# Patient Record
Sex: Male | Born: 1980 | Race: White | Hispanic: No | Marital: Married | Smoking: Current every day smoker
Health system: Southern US, Community
[De-identification: ages and names within clinical notes are randomized; demographics above are authoritative.]

## PROBLEM LIST (undated history)

## (undated) DIAGNOSIS — R569 Unspecified convulsions: Secondary | ICD-10-CM

## (undated) DIAGNOSIS — K5792 Diverticulitis of intestine, part unspecified, without perforation or abscess without bleeding: Secondary | ICD-10-CM

## (undated) DIAGNOSIS — F32A Depression, unspecified: Secondary | ICD-10-CM

## (undated) DIAGNOSIS — F419 Anxiety disorder, unspecified: Secondary | ICD-10-CM

## (undated) DIAGNOSIS — K219 Gastro-esophageal reflux disease without esophagitis: Secondary | ICD-10-CM

## (undated) DIAGNOSIS — Z87442 Personal history of urinary calculi: Secondary | ICD-10-CM

## (undated) DIAGNOSIS — M549 Dorsalgia, unspecified: Secondary | ICD-10-CM

## (undated) DIAGNOSIS — K4090 Unilateral inguinal hernia, without obstruction or gangrene, not specified as recurrent: Secondary | ICD-10-CM

## (undated) DIAGNOSIS — F4310 Post-traumatic stress disorder, unspecified: Secondary | ICD-10-CM

## (undated) DIAGNOSIS — I10 Essential (primary) hypertension: Secondary | ICD-10-CM

## (undated) HISTORY — DX: Depression, unspecified: F32.A

## (undated) HISTORY — DX: Dorsalgia, unspecified: M54.9

## (undated) HISTORY — PX: COLONOSCOPY: SHX174

## (undated) HISTORY — DX: Diverticulitis of intestine, part unspecified, without perforation or abscess without bleeding: K57.92

## (undated) HISTORY — PX: HERNIA REPAIR: SHX51

## (undated) HISTORY — DX: Anxiety disorder, unspecified: F41.9

## (undated) HISTORY — PX: INGUINAL HERNIA REPAIR: SHX194

## (undated) HISTORY — PX: BACK SURGERY: SHX140

## (undated) HISTORY — PX: COLON SURGERY: SHX602

## (undated) HISTORY — PX: OTHER SURGICAL HISTORY: SHX169

## (undated) HISTORY — DX: Post-traumatic stress disorder, unspecified: F43.10

## (undated) HISTORY — PX: SHOULDER SURGERY: SHX246

---

## 2014-07-29 HISTORY — PX: BOWEL RESECTION: SHX1257

## 2015-07-30 HISTORY — PX: CHOLECYSTECTOMY: SHX55

## 2016-08-22 DIAGNOSIS — K5900 Constipation, unspecified: Secondary | ICD-10-CM | POA: Insufficient documentation

## 2016-11-25 DIAGNOSIS — G459 Transient cerebral ischemic attack, unspecified: Secondary | ICD-10-CM | POA: Insufficient documentation

## 2019-07-07 ENCOUNTER — Emergency Department: Payer: Managed Care, Other (non HMO)

## 2019-07-07 ENCOUNTER — Other Ambulatory Visit: Payer: Self-pay

## 2019-07-07 ENCOUNTER — Emergency Department
Admission: EM | Admit: 2019-07-07 | Discharge: 2019-07-07 | Disposition: A | Discharge status: Home / Self Care | Payer: Managed Care, Other (non HMO) | Attending: Emergency Medicine | Admitting: Emergency Medicine

## 2019-07-07 ENCOUNTER — Encounter: Payer: Self-pay | Admitting: Emergency Medicine

## 2019-07-07 DIAGNOSIS — I10 Essential (primary) hypertension: Secondary | ICD-10-CM | POA: Diagnosis not present

## 2019-07-07 DIAGNOSIS — R109 Unspecified abdominal pain: Secondary | ICD-10-CM | POA: Diagnosis not present

## 2019-07-07 HISTORY — DX: Essential (primary) hypertension: I10

## 2019-07-07 LAB — COMPREHENSIVE METABOLIC PANEL
ALT: 22 U/L (ref 0–44)
AST: 21 U/L (ref 15–41)
Albumin: 4.5 g/dL (ref 3.5–5.0)
Alkaline Phosphatase: 70 U/L (ref 38–126)
Anion gap: 11 (ref 5–15)
BUN: 16 mg/dL (ref 6–20)
CO2: 25 mmol/L (ref 22–32)
Calcium: 9.6 mg/dL (ref 8.9–10.3)
Chloride: 103 mmol/L (ref 98–111)
Creatinine, Ser: 0.75 mg/dL (ref 0.61–1.24)
GFR calc Af Amer: >60 mL/min (ref >60)
GFR calc non Af Amer: >60 mL/min (ref >60)
Glucose, Bld: 81 mg/dL (ref 70–99)
Potassium: 4.2 mmol/L (ref 3.5–5.1)
Sodium: 139 mmol/L (ref 135–145)
Total Bilirubin: 0.5 mg/dL (ref 0.3–1.2)
Total Protein: 7.7 g/dL (ref 6.5–8.1)

## 2019-07-07 LAB — URINALYSIS, COMPLETE (UACMP) WITH MICROSCOPIC
Bacteria, UA: NONE SEEN
Bilirubin Urine: NEGATIVE
Glucose, UA: NEGATIVE mg/dL
Hgb urine dipstick: NEGATIVE
Ketones, ur: NEGATIVE mg/dL
Leukocytes,Ua: NEGATIVE
Nitrite: NEGATIVE
Protein, ur: NEGATIVE mg/dL
Specific Gravity, Urine: 1.021 (ref 1.005–1.030)
Squamous Epithelial / HPF: NONE SEEN (ref 0–5)
pH: 5 (ref 5.0–8.0)

## 2019-07-07 LAB — CBC
HCT: 45.2 % (ref 39.0–52.0)
Hemoglobin: 15.8 g/dL (ref 13.0–17.0)
MCH: 30.5 pg (ref 26.0–34.0)
MCHC: 35 g/dL (ref 30.0–36.0)
MCV: 87.3 fL (ref 80.0–100.0)
Platelets: 212 10*3/uL (ref 150–400)
RBC: 5.18 MIL/uL (ref 4.22–5.81)
RDW: 12.6 % (ref 11.5–15.5)
WBC: 8.9 10*3/uL (ref 4.0–10.5)
nRBC: 0 % (ref 0.0–0.2)

## 2019-07-07 LAB — LIPASE, BLOOD: Lipase: 38 U/L (ref 11–51)

## 2019-07-07 IMAGING — CT CT RENAL STONE PROTOCOL
2 of 4 series · 16 of 46 positions shown, 18 images · non-contrast
Comparison: None.

CLINICAL DATA: Left flank pain

EXAM:
CT ABDOMEN AND PELVIS WITHOUT CONTRAST
TECHNIQUE: Multidetector CT imaging of the abdomen and pelvis was performed
following the standard protocol without IV contrast.

[Series 2: stone full standard · axial · 0.77mm/px · z∈[-536,-56]mm · 13 of 106 slices shown, 15 images]
[im 5/106  soft-tissue]
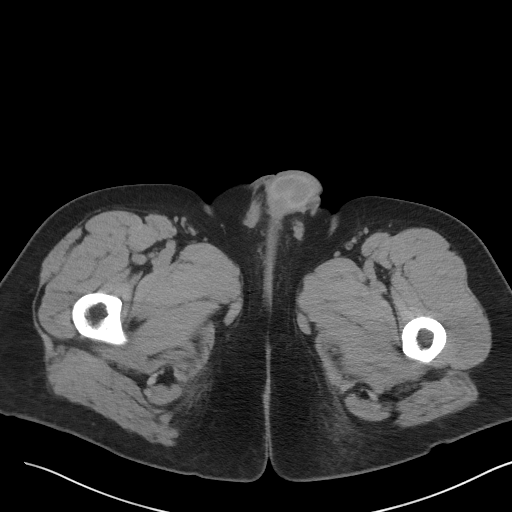
[im 5/106  bone]
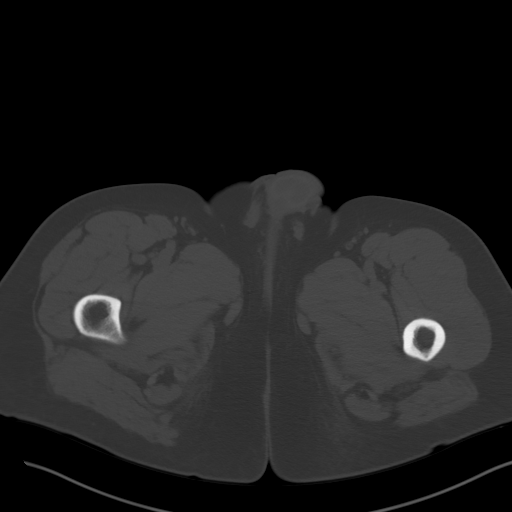
[im 14/106  soft-tissue]
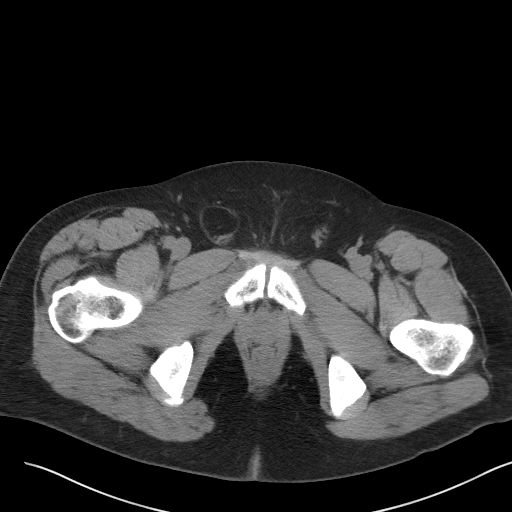
[im 22/106  soft-tissue]
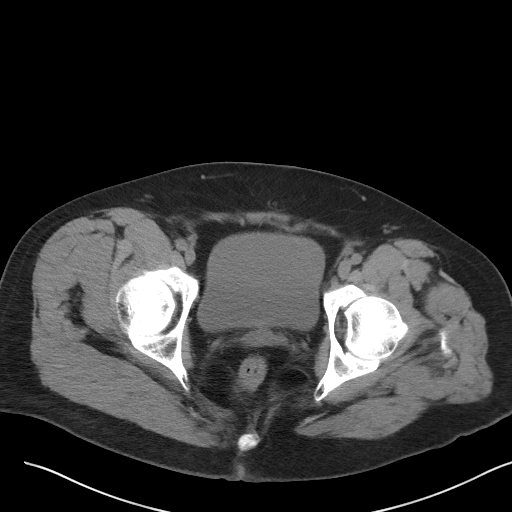
[im 31/106  soft-tissue]
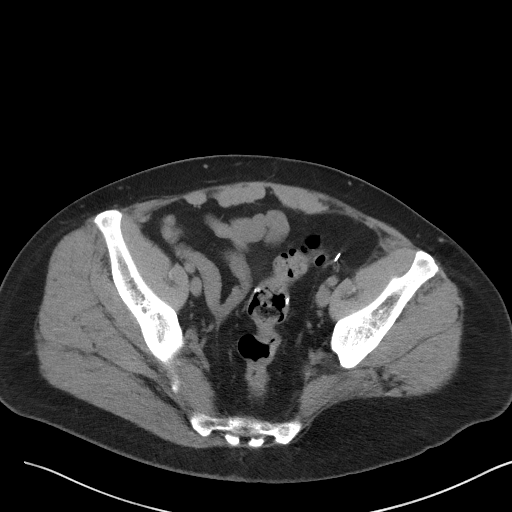
[im 36/106  soft-tissue]
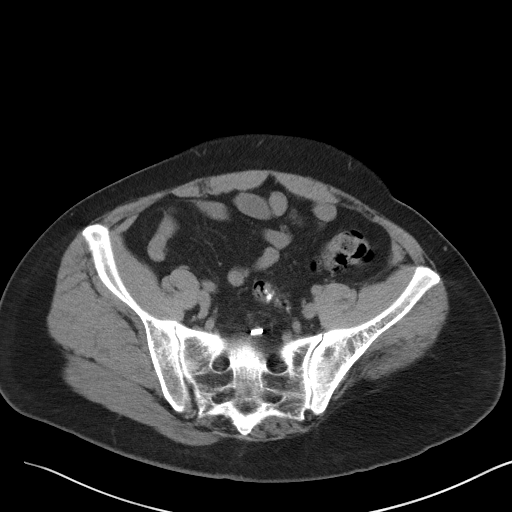
[im 44/106  soft-tissue]
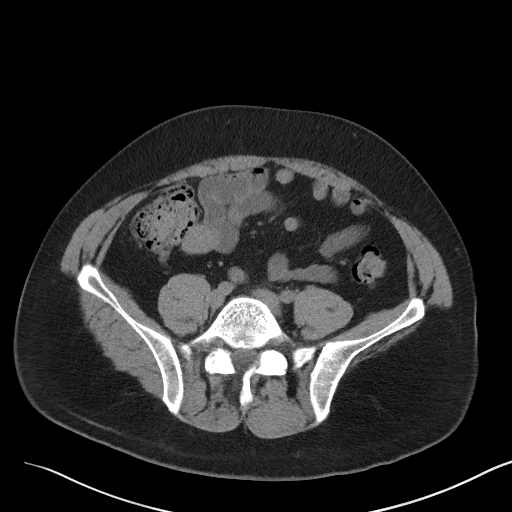
[im 53/106  soft-tissue]
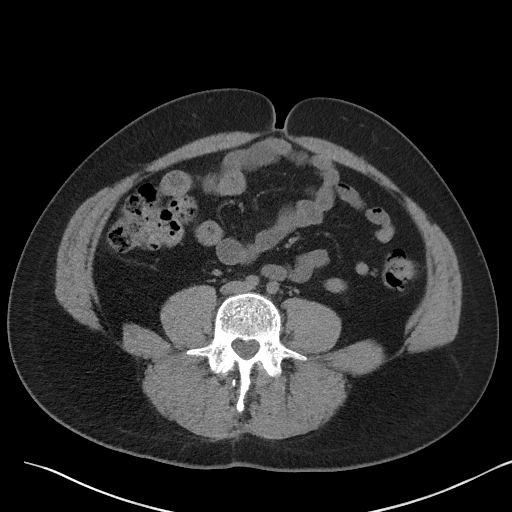
[im 62/106  soft-tissue]
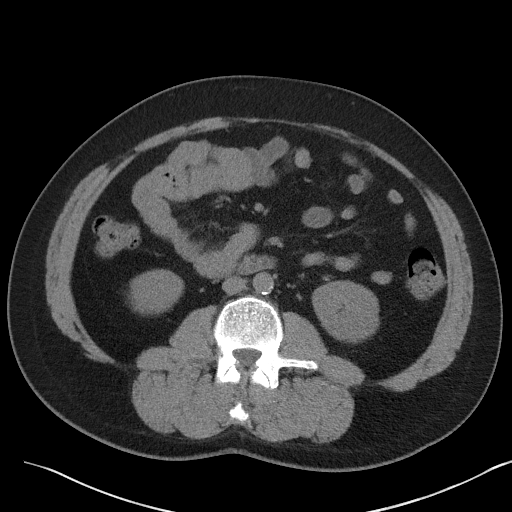
[im 71/106  soft-tissue]
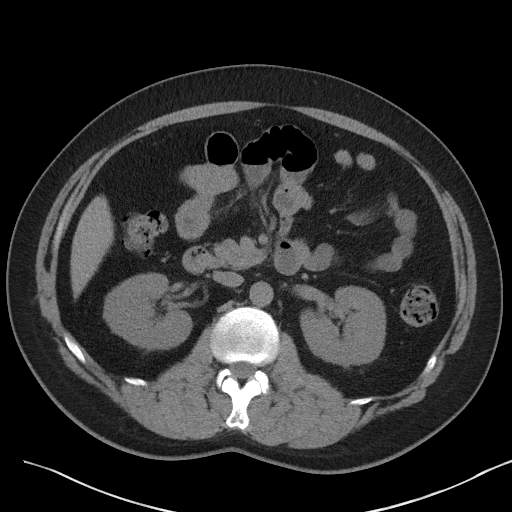
[im 71/106  bone]
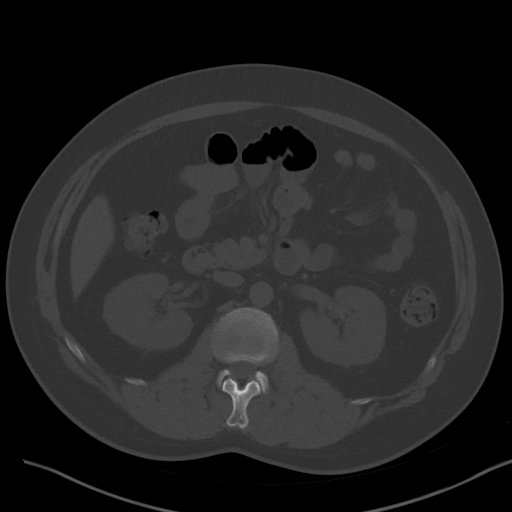
[im 75/106  soft-tissue]
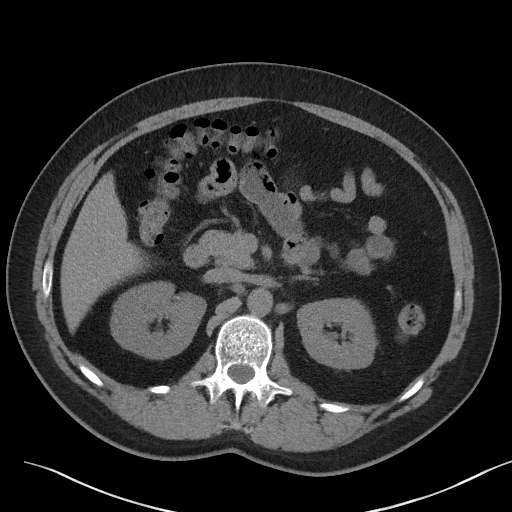
[im 84/106  soft-tissue]
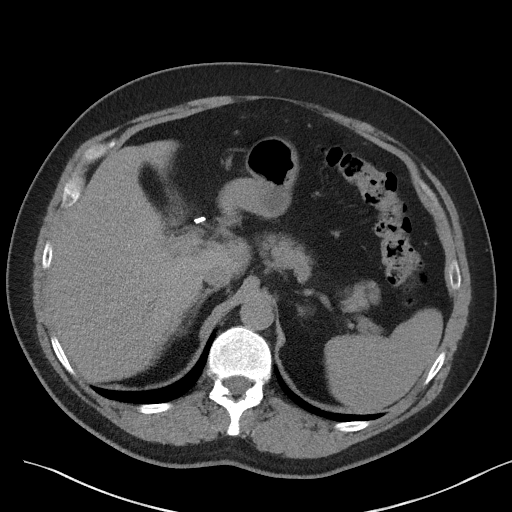
[im 92/106  soft-tissue]
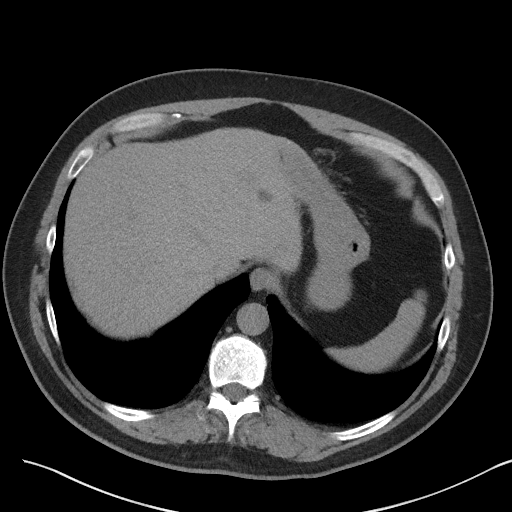
[im 101/106  soft-tissue]
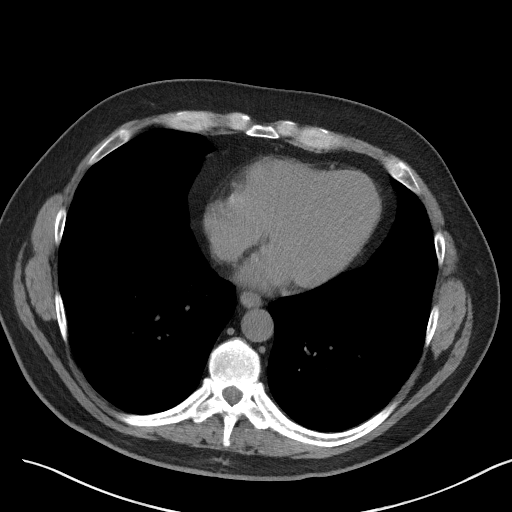

[Series 5: coronal · coronal · 0.89mm/px · 3 of 158 slices shown]
[im 53/158  soft-tissue]
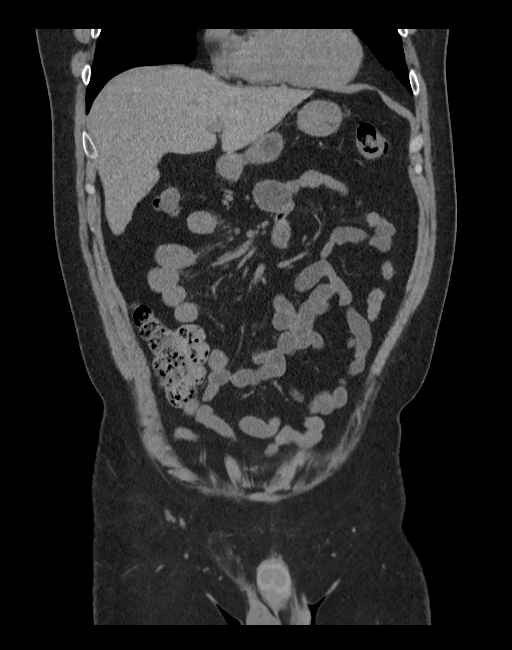
[im 70/158  soft-tissue]
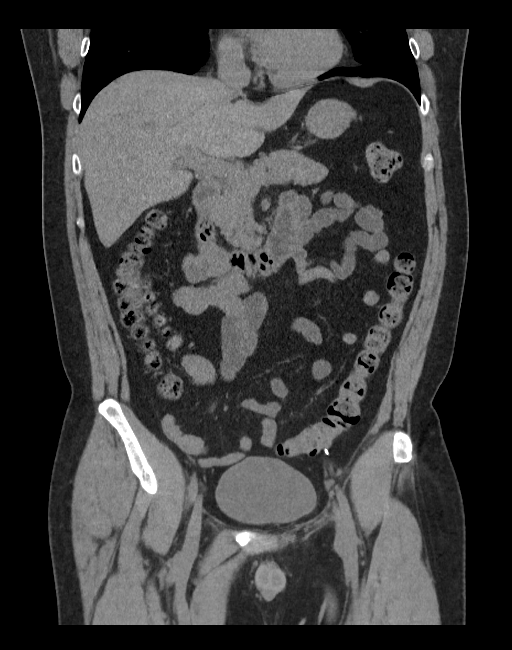
[im 88/158  soft-tissue]
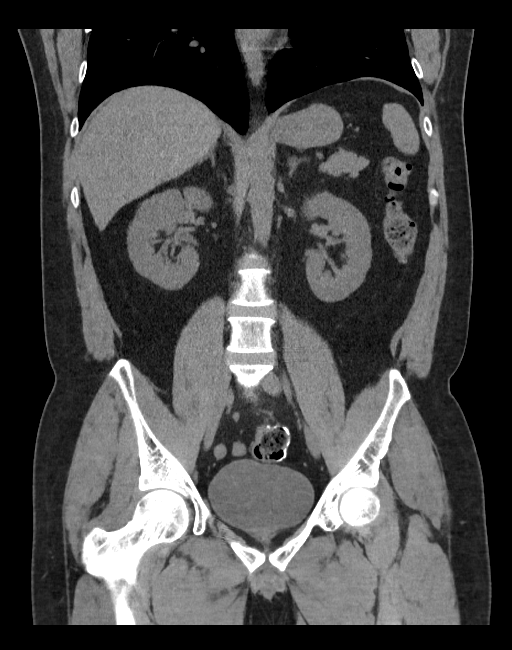

[16 of 46 positions shown; findings below may reference images not displayed]

FINDINGS: Lower chest: Lung bases clear bilaterally.

Hepatobiliary: Several small 1 cm cysts in the left lobe liver. No
liver mass. Postop cholecystectomy without biliary dilatation

Pancreas: Negative

Spleen: Negative

Adrenals/Urinary Tract: Adrenal glands are unremarkable. Kidneys are
normal, without renal calculi, focal lesion, or hydronephrosis.
Bladder is unremarkable.

Stomach/Bowel: Postop sigmoid resection for diverticulitis. Mild
residual diverticular changes in the sigmoid colon. Negative for
bowel obstruction. No bowel mass or edema. Normal appendix.

Vascular/Lymphatic: Negative.

Reproductive: Normal sized prostate.

Other: Inguinal hernia containing fat

Surgical clips in the retroperitoneum on the left presumably from
lymph node removal.

Musculoskeletal: No acute skeletal abnormality. Interspinous spacer
at L4-5.
IMPRESSION: Negative for urinary tract calculi.  No renal obstruction

Sigmoid resection with mild residual diverticular change in the
sigmoid colon. Surgical clips in the retroperitoneum on the left.

Small right inguinal hernia containing fat

## 2019-07-07 MED ORDER — AMOXICILLIN-POT CLAVULANATE 875-125 MG PO TABS: 1.0000 | ORAL_TABLET | Freq: Two times a day (BID) | ORAL | 0 refills | Status: AC

## 2019-07-07 MED ORDER — MORPHINE SULFATE (PF) 4 MG/ML IV SOLN
4.0000 mg | Freq: Once | INTRAVENOUS | Status: DC
  Filled 2019-07-07: qty 1

## 2019-07-07 MED ORDER — ONDANSETRON 4 MG PO TBDP
4.0000 mg | ORAL_TABLET | Freq: Once | ORAL | Status: AC
  Administered 2019-07-07: 4 mg via ORAL
  Filled 2019-07-07: qty 1

## 2019-07-07 MED ORDER — MORPHINE SULFATE (PF) 4 MG/ML IV SOLN
4.0000 mg | Freq: Once | INTRAVENOUS | Status: AC
  Administered 2019-07-07: 4 mg via INTRAVENOUS

## 2019-07-07 MED ORDER — MORPHINE SULFATE (PF) 4 MG/ML IV SOLN
4.0000 mg | Freq: Once | INTRAVENOUS | Status: AC
  Administered 2019-07-07: 4 mg via INTRAVENOUS
  Filled 2019-07-07: qty 1

## 2019-07-07 NOTE — ED Notes (Signed)
Signature pad not working, pt verbalized discharge papers/instructions

## 2019-07-07 NOTE — ED Notes (Signed)
Pt attempting to provided urine sample.

## 2019-07-07 NOTE — ED Provider Notes (Signed)
[facility] Emergency Department Provider Note  Time seen: 5:00 PM  I have reviewed the triage vital signs and the nursing notes.   HISTORY  Chief Complaint Abdominal Pain    HPI Mark Austin is a 38 y.o. male with a past medical history of hypertension presents to the emergency department for left-sided abdominal pain.  According to the patient he has a history of diverticulitis requiring a partial colectomy in the past due to a ruptured diverticula.  Has not had any issues since over the past 5 years however last night he began experiencing dull left-sided abdominal pain.  States he has moderate dull aching pain that has become more constant today.  Denies any vomiting or nausea denies diarrhea black or bloody stool.  Denies dysuria or hematuria.  No fever cough or shortness of breath.   Past Medical History:  Diagnosis Date  . Hypertension     There are no active problems to display for this patient.   Past Surgical History:  Procedure Laterality Date  . back surgeries      Prior to Admission medications   Not on File    Allergies  Allergen Reactions  . Toradol [Ketorolac Tromethamine] Other (See Comments)    seizures  . Tramadol Other (See Comments)    Seizures     No family history on file.  Social History Social History   Tobacco Use  . Smoking status: Not on file  Substance Use Topics  . Alcohol use: Not on file  . Drug use: Not on file    Review of Systems Constitutional: Negative for fever Cardiovascular: Negative for chest pain. Respiratory: Negative for shortness of breath. Gastrointestinal: Positive for abdominal pain.  Negative for nausea vomiting diarrhea Genitourinary: Negative for urinary compaints Musculoskeletal: Negative for musculoskeletal complaints Neurological: Negative for headache All other ROS negative  ____________________________________________   PHYSICAL EXAM:  VITAL SIGNS: ED Triage Vitals   Enc Vitals Group     BP 07/07/19 1548 (!) 183/114     Pulse Rate 07/07/19 1548 89     Resp 07/07/19 1548 19     Temp 07/07/19 1548 98.6 F (37 C)     Temp Source 07/07/19 1548 Oral     SpO2 07/07/19 1548 98 %     Weight 07/07/19 1535 225 lb (102.1 kg)     Height 07/07/19 1535 5\' 11"  (1.803 m)     Head Circumference --      Peak Flow --      Pain Score 07/07/19 1535 8     Pain Loc --      Pain Edu? --      Excl. in [place]? --     Constitutional: Alert and oriented. Well appearing and in no distress. Eyes: Normal exam ENT      Head: Normocephalic and atraumatic.      Mouth/Throat: Mucous membranes are moist. Cardiovascular: Normal rate, regular rhythm. Respiratory: Normal respiratory effort without tachypnea nor retractions. Breath sounds are clear  Gastrointestinal: Soft, moderate left-sided abdominal tenderness palpation without rebound guarding or distention. Musculoskeletal: Nontender with normal range of motion in all extremities.  Neurologic:  Normal speech and language. No gross focal neurologic deficits  Skin:  Skin is warm, dry and intact.  Psychiatric: Mood and affect are normal.  ____________________________________________    EKG  EKG viewed and interpreted by myself shows a normal sinus rhythm 87 bpm with a narrow QRS, mild right axis deviation, largely normal intervals with no concerning  ST changes.  ____________________________________________    RADIOLOGY  CT essentially negative for acute abnormality.  ____________________________________________   INITIAL IMPRESSION / ASSESSMENT AND PLAN / ED COURSE  Pertinent labs & imaging results that were available during my care of the patient were reviewed by me and considered in my medical decision making (see chart for details).   Patient presents to the emergency department for moderate left-sided abdominal pain history of diverticulitis requiring partial colectomy.  Overall the patient appears well moderate  tenderness in left upper quadrant.  We will obtain a CT scan of the abdomen/pelvis to further evaluate.  Labs are largely within normal limits, urinalysis pending.  Lab work is largely reassuring.  CT is essentially negative.  Urinalysis is normal.  However given the patient's history of significant diverticulitis disease with equivocal CT findings we will cover with Augmentin as well as pain medication have the patient follow-up with his doctor.  Patient agreeable to plan of care.  Discussed return precautions.  Mark Austin was evaluated in Emergency Department on 07/07/2019 for the symptoms described in the history of present illness. He was evaluated in the context of the global COVID-19 pandemic, which necessitated consideration that the patient might be at risk for infection with the SARS-CoV-2 virus that causes COVID-19. Institutional protocols and algorithms that pertain to the evaluation of patients at risk for COVID-19 are in a state of rapid change based on information released by regulatory bodies including the CDC and federal and state organizations. These policies and algorithms were followed during the patient's care in the ED.  ____________________________________________   FINAL CLINICAL IMPRESSION(S) / ED DIAGNOSES  Left-sided abdominal pain   Harvest Dark, MD 07/07/19 1812

## 2019-07-07 NOTE — ED Triage Notes (Signed)
Pt reports pain to LUQ for a day and a half, sharp in nature worse with sudden movement and constant.

## 2019-09-27 ENCOUNTER — Emergency Department: Payer: Managed Care, Other (non HMO)

## 2019-09-27 ENCOUNTER — Other Ambulatory Visit: Payer: Self-pay

## 2019-09-27 ENCOUNTER — Encounter: Payer: Self-pay | Admitting: Emergency Medicine

## 2019-09-27 ENCOUNTER — Emergency Department
Admission: EM | Admit: 2019-09-27 | Discharge: 2019-09-27 | Disposition: A | Discharge status: Home / Self Care | Payer: Managed Care, Other (non HMO) | Attending: Emergency Medicine | Admitting: Emergency Medicine

## 2019-09-27 DIAGNOSIS — I10 Essential (primary) hypertension: Secondary | ICD-10-CM | POA: Diagnosis not present

## 2019-09-27 DIAGNOSIS — F17200 Nicotine dependence, unspecified, uncomplicated: Secondary | ICD-10-CM | POA: Diagnosis not present

## 2019-09-27 DIAGNOSIS — K4090 Unilateral inguinal hernia, without obstruction or gangrene, not specified as recurrent: Secondary | ICD-10-CM | POA: Insufficient documentation

## 2019-09-27 DIAGNOSIS — R1031 Right lower quadrant pain: Secondary | ICD-10-CM | POA: Diagnosis present

## 2019-09-27 LAB — URINALYSIS, COMPLETE (UACMP) WITH MICROSCOPIC
Bacteria, UA: NONE SEEN
Bilirubin Urine: NEGATIVE
Glucose, UA: NEGATIVE mg/dL
Hgb urine dipstick: NEGATIVE
Ketones, ur: NEGATIVE mg/dL
Leukocytes,Ua: NEGATIVE
Nitrite: NEGATIVE
Protein, ur: NEGATIVE mg/dL
Specific Gravity, Urine: 1.01 (ref 1.005–1.030)
Squamous Epithelial / HPF: NONE SEEN (ref 0–5)
pH: 6 (ref 5.0–8.0)

## 2019-09-27 LAB — CBC
HCT: 53.5 % — ABNORMAL HIGH (ref 39.0–52.0)
Hemoglobin: 18.1 g/dL — ABNORMAL HIGH (ref 13.0–17.0)
MCH: 30.7 pg (ref 26.0–34.0)
MCHC: 33.8 g/dL (ref 30.0–36.0)
MCV: 90.8 fL (ref 80.0–100.0)
Platelets: 246 10*3/uL (ref 150–400)
RBC: 5.89 MIL/uL — ABNORMAL HIGH (ref 4.22–5.81)
RDW: 12.6 % (ref 11.5–15.5)
WBC: 11.2 10*3/uL — ABNORMAL HIGH (ref 4.0–10.5)
nRBC: 0 % (ref 0.0–0.2)

## 2019-09-27 LAB — COMPREHENSIVE METABOLIC PANEL
ALT: 104 U/L — ABNORMAL HIGH (ref 0–44)
AST: 275 U/L — ABNORMAL HIGH (ref 15–41)
Albumin: 4.4 g/dL (ref 3.5–5.0)
Alkaline Phosphatase: 59 U/L (ref 38–126)
Anion gap: 10 (ref 5–15)
BUN: 11 mg/dL (ref 6–20)
CO2: 25 mmol/L (ref 22–32)
Calcium: 9.4 mg/dL (ref 8.9–10.3)
Chloride: 99 mmol/L (ref 98–111)
Creatinine, Ser: 0.88 mg/dL (ref 0.61–1.24)
GFR calc Af Amer: >60 mL/min (ref >60)
GFR calc non Af Amer: >60 mL/min (ref >60)
Glucose, Bld: 92 mg/dL (ref 70–99)
Potassium: 4.4 mmol/L (ref 3.5–5.1)
Sodium: 134 mmol/L — ABNORMAL LOW (ref 135–145)
Total Bilirubin: 0.8 mg/dL (ref 0.3–1.2)
Total Protein: 7.7 g/dL (ref 6.5–8.1)

## 2019-09-27 LAB — LIPASE, BLOOD: Lipase: 30 U/L (ref 11–51)

## 2019-09-27 IMAGING — CT CT RENAL STONE PROTOCOL
2 of 4 series · 16 of 46 positions shown, 18 images · non-contrast
Comparison: [DATE].

CLINICAL DATA: Acute right flank pain.

EXAM:
CT ABDOMEN AND PELVIS WITHOUT CONTRAST
TECHNIQUE: Multidetector CT imaging of the abdomen and pelvis was performed
following the standard protocol without IV contrast.

[Series 2: stone full standard · axial · 0.74mm/px · z∈[-538,-74]mm · 13 of 103 slices shown, 15 images]
[im 5/103  soft-tissue]
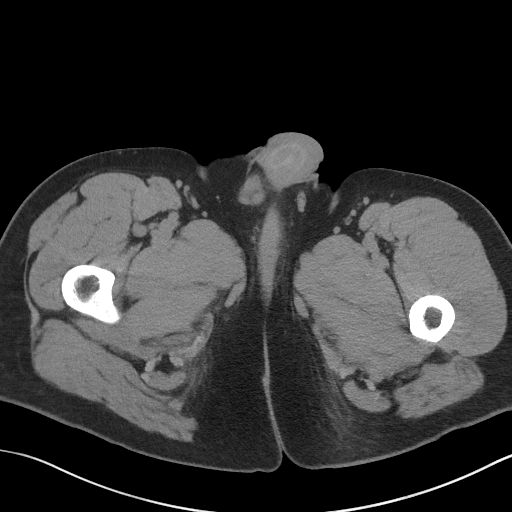
[im 5/103  bone]
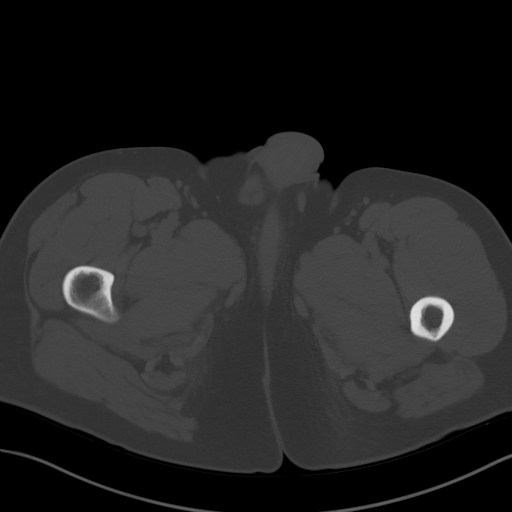
[im 13/103  soft-tissue]
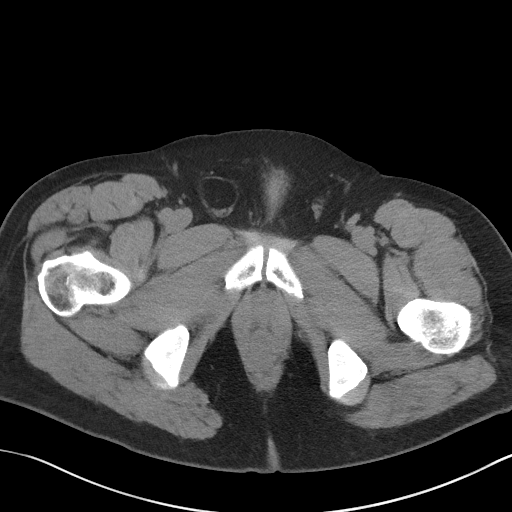
[im 22/103  soft-tissue]
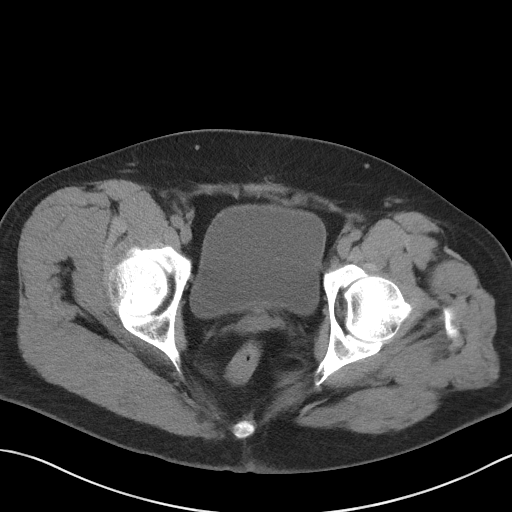
[im 30/103  soft-tissue]
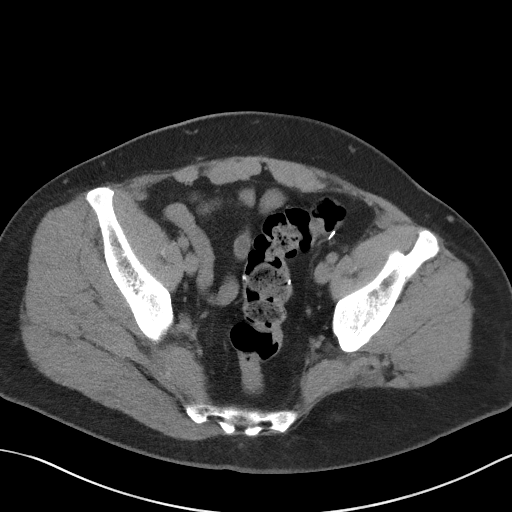
[im 35/103  soft-tissue]
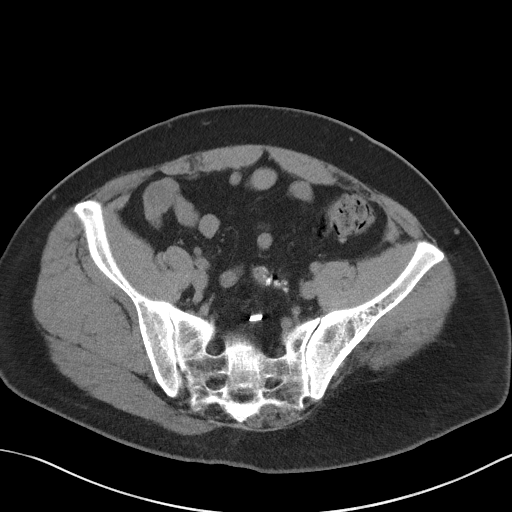
[im 43/103  soft-tissue]
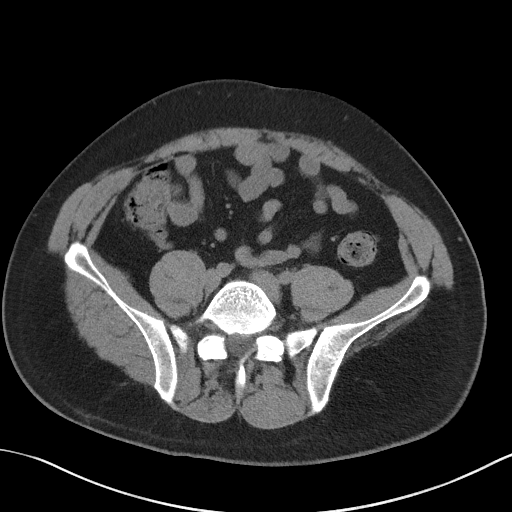
[im 52/103  soft-tissue]
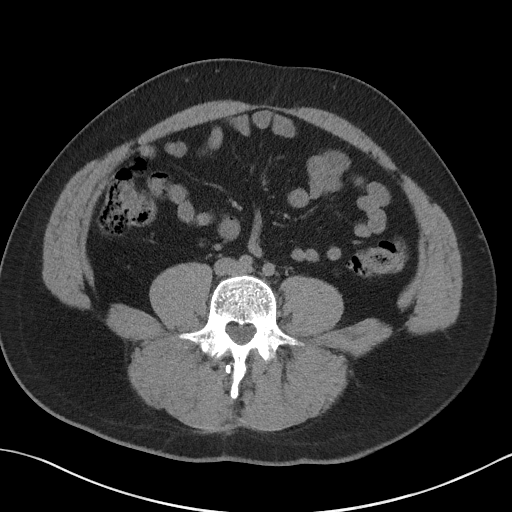
[im 60/103  soft-tissue]
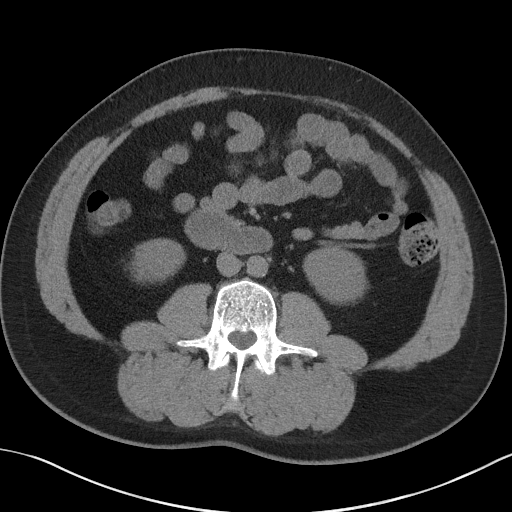
[im 69/103  soft-tissue]
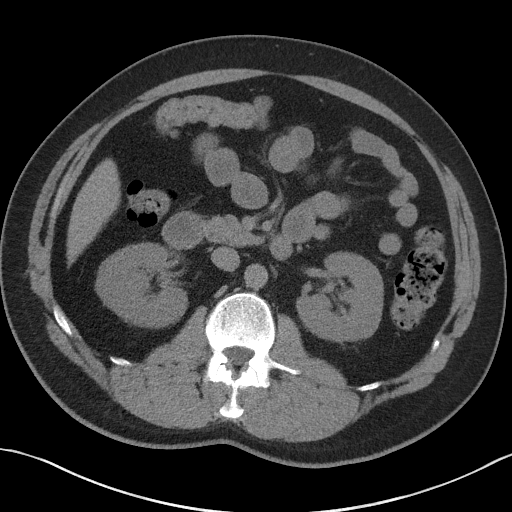
[im 69/103  bone]
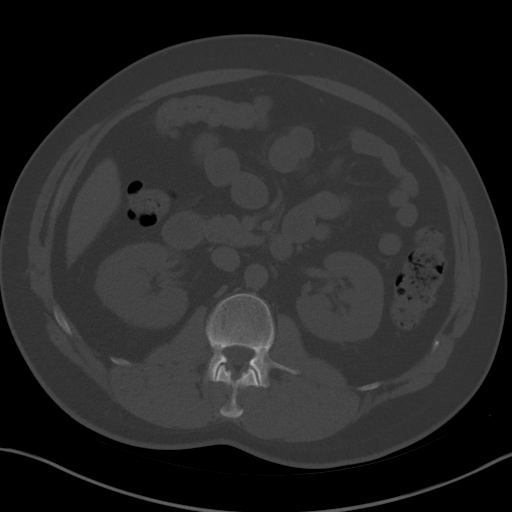
[im 73/103  soft-tissue]
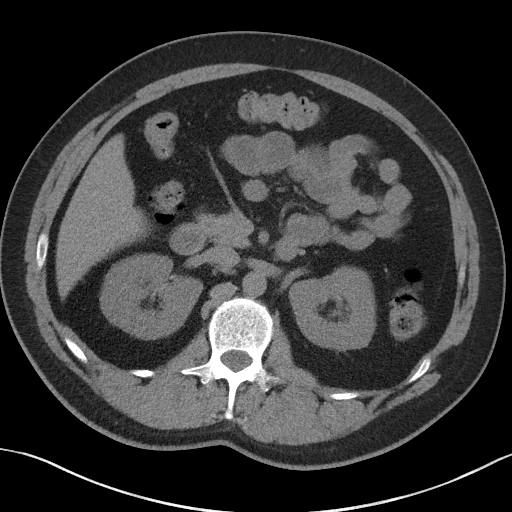
[im 81/103  soft-tissue]
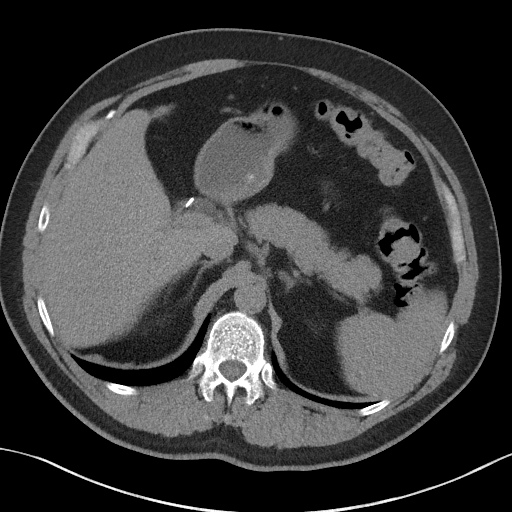
[im 90/103  soft-tissue]
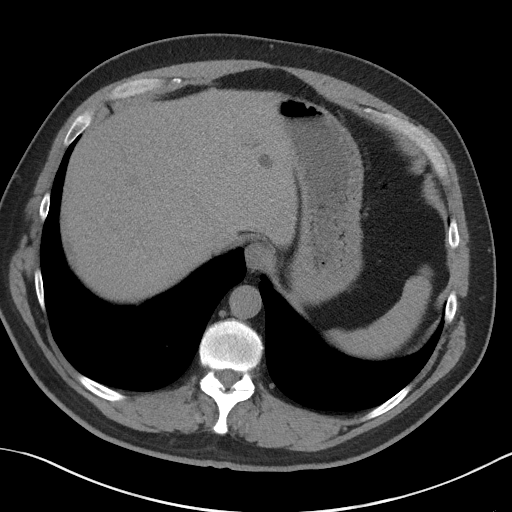
[im 98/103  soft-tissue]
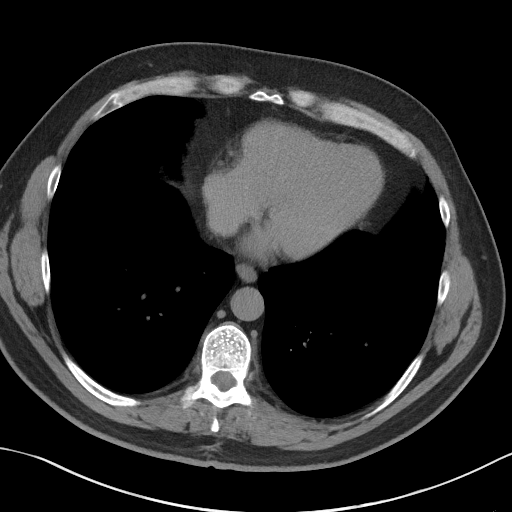

[Series 5: coronal · coronal · 0.85mm/px · 3 of 165 slices shown]
[im 55/165  soft-tissue]
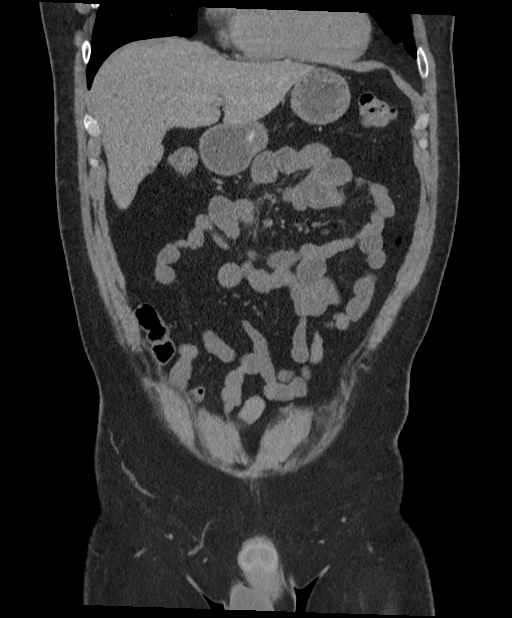
[im 73/165  soft-tissue]
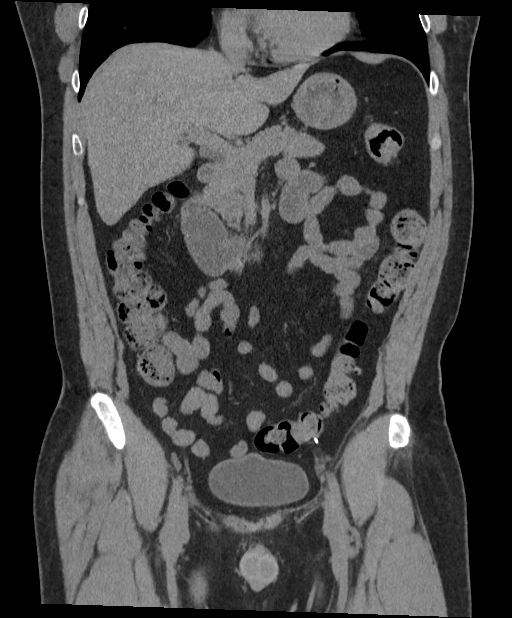
[im 92/165  soft-tissue]
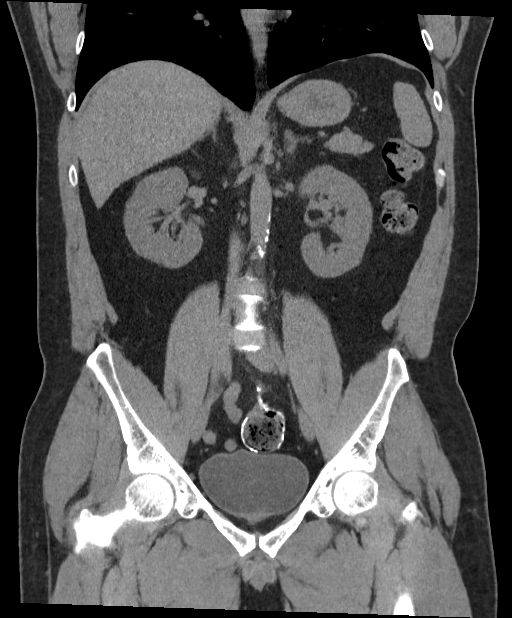

[16 of 46 positions shown; findings below may reference images not displayed]

FINDINGS: Lower chest: No acute abnormality.

Hepatobiliary: Status post cholecystectomy. No biliary dilatation is
noted. Stable hepatic cysts are noted.

Pancreas: Unremarkable. No pancreatic ductal dilatation or
surrounding inflammatory changes.

Spleen: Normal in size without focal abnormality.

Adrenals/Urinary Tract: Adrenal glands are unremarkable. Kidneys are
normal, without renal calculi, focal lesion, or hydronephrosis.
Bladder is unremarkable.

Stomach/Bowel: Stomach is within normal limits. Appendix appears
normal. No evidence of bowel wall thickening, distention, or
inflammatory changes.

Vascular/Lymphatic: Aortic atherosclerosis. No enlarged abdominal or
pelvic lymph nodes.

Reproductive: Prostate is unremarkable.

Other: Small fat containing right inguinal hernia is noted. No
ascites is noted.

Musculoskeletal: No acute or significant osseous findings.
IMPRESSION: 1. Small fat containing right inguinal hernia.
2. Stable hepatic cysts.
3. No hydronephrosis or renal obstruction is noted. No renal or
ureteral calculi are noted.

Aortic Atherosclerosis ([93]-[93]).

## 2019-09-27 MED ORDER — MORPHINE SULFATE (PF) 4 MG/ML IV SOLN
4.0000 mg | Freq: Once | INTRAVENOUS | Status: AC
  Administered 2019-09-27: 14:00:00 4 mg via INTRAVENOUS
  Filled 2019-09-27: qty 1

## 2019-09-27 MED ORDER — ONDANSETRON HCL 4 MG/2ML IJ SOLN
4.0000 mg | Freq: Once | INTRAMUSCULAR | Status: AC
  Administered 2019-09-27: 4 mg via INTRAVENOUS
  Filled 2019-09-27: qty 2

## 2019-09-27 NOTE — ED Triage Notes (Signed)
Patient presents to the ED with right lower quadrant abdominal pain that began while he was working out at the gym approx. 2 hours ago.  Patient states pain has increased until it is now unbearable.

## 2019-09-27 NOTE — ED Provider Notes (Signed)
[facility] Emergency Department Provider Note   ____________________________________________    I have reviewed the triage vital signs and the nursing notes.   HISTORY  Chief Complaint Abdominal Pain     HPI Mark Austin is a 39 y.o. male who presents with complaints of abdominal pain.  Patient describes right groin pain that occurred approximately 2 hours prior to arrival.  He reports it is a sharp poking sensation which comes and goes.  Denies nausea or vomiting.  No fevers or chills.  No injury to the area.  Does have history of kidney stones however this does not feel quite the same, usually his pain is further in his flank.  Does not radiate.  Past Medical History:  Diagnosis Date  . Hypertension     There are no problems to display for this patient.   Past Surgical History:  Procedure Laterality Date  . back surgeries      Prior to Admission medications   Not on File     Allergies Toradol [ketorolac tromethamine] and Tramadol  No family history on file.  Social History Social History   Tobacco Use  . Smoking status: Current Every Day Smoker    Packs/day: 0.75  . Smokeless tobacco: Never Used  Substance Use Topics  . Alcohol use: Never  . Drug use: Not on file    Review of Systems  Constitutional: No fever/chills Eyes: No visual changes.  ENT: No sore throat. Cardiovascular: Denies chest pain. Respiratory: Denies shortness of breath. Gastrointestinal: As above Genitourinary: As above Musculoskeletal: Negative for back pain. Skin: Negative for rash. Neurological: Negative for headaches or weakness   ____________________________________________   PHYSICAL EXAM:  VITAL SIGNS: ED Triage Vitals  Enc Vitals Group     BP 09/27/19 1136 (!) 161/99     Pulse Rate 09/27/19 1136 98     Resp 09/27/19 1136 18     Temp 09/27/19 1136 98.2 F (36.8 C)     Temp Source 09/27/19 1136 Oral     SpO2 09/27/19 1136 97 %   Weight 09/27/19 1137 99.8 kg (220 lb)     Height 09/27/19 1137 1.803 m (5\' 11" )     Head Circumference --      Peak Flow --      Pain Score 09/27/19 1137 8     Pain Loc --      Pain Edu? --      Excl. in [place]? --     Constitutional: Alert and oriented.  Nose: No congestion/rhinnorhea. Mouth/Throat: Mucous membranes are moist.    Cardiovascular: Normal rate, regular rhythm. Kermit Balo peripheral circulation. Respiratory: Normal respiratory effort.  No retractions. . Gastrointestinal: Soft and nontender. No distention.  No CVA tenderness. Genitourinary: Possible right inguinal hernia Musculoskeletal: .  Warm and well perfused Neurologic:  Normal speech and language. No gross focal neurologic deficits are appreciated.  Skin:  Skin is warm, dry and intact. No rash noted. Psychiatric: Mood and affect are normal. Speech and behavior are normal.  ____________________________________________   LABS (all labs ordered are listed, but only abnormal results are displayed)  Labs Reviewed  COMPREHENSIVE METABOLIC PANEL - Abnormal; Notable for the following components:      Result Value   Sodium 134 (*)    AST 275 (*)    ALT 104 (*)    All other components within normal limits  CBC - Abnormal; Notable for the following components:   WBC 11.2 (*)  RBC 5.89 (*)    Hemoglobin 18.1 (*)    HCT 53.5 (*)    All other components within normal limits  URINALYSIS, COMPLETE (UACMP) WITH MICROSCOPIC - Abnormal; Notable for the following components:   Color, Urine YELLOW (*)    APPearance CLEAR (*)    All other components within normal limits  LIPASE, BLOOD   ____________________________________________  EKG  None ____________________________________________  RADIOLOGY  CT negative for kidney stone, right inguinal hernia, fat-containing ____________________________________________   PROCEDURES  Procedure(s) performed: No  Procedures   Critical Care performed: No  ____________________________________________   INITIAL IMPRESSION / ASSESSMENT AND PLAN / ED COURSE  Pertinent labs & imaging results that were available during my care of the patient were reviewed by me and considered in my medical decision making (see chart for details).  Patient presents with right groin pain, differential includes hernia versus kidney stone.  Will treat with IV morphine, IV Zofran given Toradol allergy.    Mild elevation in white blood cell count is nonspecific.  Patient does have elevation in AST and ALT, I communicated these results to him, [facility] negative for kidney stone, positive for small hernia, not palpable on exam.  Patient is feeling much better after treatment.  He notes that he will follow-up with his general surgeon at the [facility], does not want any prescriptions or referrals.   ____________________________________________   FINAL CLINICAL IMPRESSION(S) / ED DIAGNOSES  Final diagnoses:  Unilateral inguinal hernia without obstruction or gangrene, recurrence not specified        Note:  This document was prepared using Dragon voice recognition software and may include unintentional dictation errors.   Lavonia Drafts, MD 09/27/19 1537

## 2019-09-29 ENCOUNTER — Other Ambulatory Visit: Payer: Self-pay

## 2019-09-29 ENCOUNTER — Emergency Department
Admission: EM | Admit: 2019-09-29 | Discharge: 2019-09-29 | Disposition: A | Discharge status: Home / Self Care | Payer: Managed Care, Other (non HMO) | Attending: Emergency Medicine | Admitting: Emergency Medicine

## 2019-09-29 DIAGNOSIS — R1031 Right lower quadrant pain: Secondary | ICD-10-CM | POA: Diagnosis present

## 2019-09-29 DIAGNOSIS — K4090 Unilateral inguinal hernia, without obstruction or gangrene, not specified as recurrent: Secondary | ICD-10-CM | POA: Diagnosis not present

## 2019-09-29 DIAGNOSIS — F17200 Nicotine dependence, unspecified, uncomplicated: Secondary | ICD-10-CM | POA: Diagnosis not present

## 2019-09-29 DIAGNOSIS — I10 Essential (primary) hypertension: Secondary | ICD-10-CM | POA: Insufficient documentation

## 2019-09-29 LAB — COMPREHENSIVE METABOLIC PANEL
ALT: 81 U/L — ABNORMAL HIGH (ref 0–44)
AST: 130 U/L — ABNORMAL HIGH (ref 15–41)
Albumin: 4.6 g/dL (ref 3.5–5.0)
Alkaline Phosphatase: 54 U/L (ref 38–126)
Anion gap: 7 (ref 5–15)
BUN: 15 mg/dL (ref 6–20)
CO2: 27 mmol/L (ref 22–32)
Calcium: 9.9 mg/dL (ref 8.9–10.3)
Chloride: 100 mmol/L (ref 98–111)
Creatinine, Ser: 0.85 mg/dL (ref 0.61–1.24)
GFR calc Af Amer: >60 mL/min (ref >60)
GFR calc non Af Amer: >60 mL/min (ref >60)
Glucose, Bld: 101 mg/dL — ABNORMAL HIGH (ref 70–99)
Potassium: 4.3 mmol/L (ref 3.5–5.1)
Sodium: 134 mmol/L — ABNORMAL LOW (ref 135–145)
Total Bilirubin: 0.7 mg/dL (ref 0.3–1.2)
Total Protein: 8.1 g/dL (ref 6.5–8.1)

## 2019-09-29 LAB — CBC
HCT: 53.8 % — ABNORMAL HIGH (ref 39.0–52.0)
Hemoglobin: 17.9 g/dL — ABNORMAL HIGH (ref 13.0–17.0)
MCH: 30.8 pg (ref 26.0–34.0)
MCHC: 33.3 g/dL (ref 30.0–36.0)
MCV: 92.6 fL (ref 80.0–100.0)
Platelets: 244 10*3/uL (ref 150–400)
RBC: 5.81 MIL/uL (ref 4.22–5.81)
RDW: 12.8 % (ref 11.5–15.5)
WBC: 11.2 10*3/uL — ABNORMAL HIGH (ref 4.0–10.5)
nRBC: 0 % (ref 0.0–0.2)

## 2019-09-29 LAB — URINALYSIS, COMPLETE (UACMP) WITH MICROSCOPIC
Bacteria, UA: NONE SEEN
Bilirubin Urine: NEGATIVE
Glucose, UA: NEGATIVE mg/dL
Hgb urine dipstick: NEGATIVE
Ketones, ur: NEGATIVE mg/dL
Leukocytes,Ua: NEGATIVE
Nitrite: NEGATIVE
Protein, ur: NEGATIVE mg/dL
Specific Gravity, Urine: 1.01 (ref 1.005–1.030)
Squamous Epithelial / HPF: NONE SEEN (ref 0–5)
pH: 6 (ref 5.0–8.0)

## 2019-09-29 LAB — LIPASE, BLOOD: Lipase: 36 U/L (ref 11–51)

## 2019-09-29 MED ORDER — SODIUM CHLORIDE 0.9% FLUSH
3.0000 mL | Freq: Once | INTRAVENOUS | Status: AC
  Administered 2019-09-29: 3 mL via INTRAVENOUS

## 2019-09-29 MED ORDER — HYDROMORPHONE HCL 1 MG/ML IJ SOLN
1.0000 mg | Freq: Once | INTRAMUSCULAR | Status: AC
  Administered 2019-09-29: 20:00:00 1 mg via INTRAMUSCULAR
  Filled 2019-09-29: qty 1

## 2019-09-29 NOTE — ED Provider Notes (Signed)
[facility] Emergency Department Provider Note  Time seen: 8:00 PM  I have reviewed the triage vital signs and the nursing notes.   HISTORY  Chief Complaint Abdominal Pain   HPI Mark Austin is a 39 y.o. male with a past medical history of hypertension, chronic pain, presents to emergency department for right lower quadrant abdominal pain.  According to the patient the past several weeks he has been experiencing intermittent lower abdominal pain in the right lower quadrant.  States it is gotten worse recently.  Patient was seen for the same several days ago found to have a fat-containing right inguinal hernia at the site of his discomfort.  Denies any fever.  No dysuria.  Normal bowel movement today.  No constipation or diarrhea.  Past Medical History:  Diagnosis Date  . Hypertension     There are no problems to display for this patient.   Past Surgical History:  Procedure Laterality Date  . back surgeries      Prior to Admission medications   Not on File    Allergies  Allergen Reactions  . Toradol [Ketorolac Tromethamine] Other (See Comments)    seizures  . Tramadol Other (See Comments)    Seizures     History reviewed. No pertinent family history.  Social History Social History   Tobacco Use  . Smoking status: Current Every Day Smoker    Packs/day: 0.75  . Smokeless tobacco: Never Used  Substance Use Topics  . Alcohol use: Never  . Drug use: Not on file    Review of Systems Constitutional: Negative for fever. Cardiovascular: Negative for chest pain. Respiratory: Negative for shortness of breath. Gastrointestinal: Right lower quadrant abdominal pain.  Negative for vomiting or diarrhea. Genitourinary: Negative for urinary compaints Musculoskeletal: Negative for musculoskeletal complaints Neurological: Negative for headache All other ROS negative  ____________________________________________   PHYSICAL EXAM:  VITAL SIGNS: ED  Triage Vitals  Enc Vitals Group     BP 09/29/19 1744 (!) 193/117     Pulse Rate 09/29/19 1744 97     Resp 09/29/19 1744 18     Temp 09/29/19 1744 98.4 F (36.9 C)     Temp Source 09/29/19 1744 Oral     SpO2 09/29/19 1744 95 %     Weight 09/29/19 1742 218 lb 4.1 oz (99 kg)     Height 09/29/19 1742 5\' 11"  (1.803 m)     Head Circumference --      Peak Flow --      Pain Score 09/29/19 1742 10     Pain Loc --      Pain Edu? --      Excl. in [place]? --    Constitutional: Alert and oriented. Well appearing and in no distress. Eyes: Normal exam ENT      Head: Normocephalic and atraumatic.      Mouth/Throat: Mucous membranes are moist. Cardiovascular: Normal rate, regular rhythm.  Respiratory: Normal respiratory effort without tachypnea nor retractions. Breath sounds are clear  Gastrointestinal: Soft, mild to moderate tenderness to palpation over the right inguinal canal area.  Normal GU exam.  No scrotal mass or tenderness. Musculoskeletal: Nontender with normal range of motion in all extremities.  Neurologic:  Normal speech and language. No gross focal neurologic deficits  Skin:  Skin is warm, dry and intact.  Psychiatric: Mood and affect are normal.   ____________________________________________    EKG  EKG viewed and interpreted by myself shows a normal sinus rhythm at 96 bpm  with a narrow QRS, normal axis, normal intervals, no concerning ST changes.  INITIAL IMPRESSION / ASSESSMENT AND PLAN / ED COURSE  Pertinent labs & imaging results that were available during my care of the patient were reviewed by me and considered in my medical decision making (see chart for details).   Patient presents to the emergency department for continued right lower quadrant abdominal pain, states it improved somewhat yesterday but worsened again today.  The pain is overlying the right inguinal canal.  Normal GU exam, no signs of a significant inguinal hernia.  No constipation with a normal bowel  movement today.  Lab work is largely unchanged.  Patient had a recent CT scan showing fat-containing right inguinal hernia which is likely the cause of the patient's pain today.  States it feels the same.  We will discharge with surgery follow-up.  Patient is on chronic pain medication at home, we will dose a one-time pain medication in the emergency department.  Patient agreeable to plan of care.  Discussed my typical abdominal pain return precautions.  Mark Austin was evaluated in Emergency Department on 09/29/2019 for the symptoms described in the history of present illness. He was evaluated in the context of the global COVID-19 pandemic, which necessitated consideration that the patient might be at risk for infection with the SARS-CoV-2 virus that causes COVID-19. Institutional protocols and algorithms that pertain to the evaluation of patients at risk for COVID-19 are in a state of rapid change based on information released by regulatory bodies including the CDC and federal and state organizations. These policies and algorithms were followed during the patient's care in the ED.  ____________________________________________   FINAL CLINICAL IMPRESSION(S) / ED DIAGNOSES  Inguinal hernia   Harvest Dark, MD 09/29/19 2021

## 2019-09-29 NOTE — ED Triage Notes (Signed)
Pt comes POV with severe right lower abdominal pain from groin to top of hip. Pt was here Monday and dx with hernia. Told to come back if pain got worse. Pain was fine yesterday but got really bad again today.

## 2019-10-04 ENCOUNTER — Encounter: Payer: Self-pay | Admitting: Surgery

## 2019-10-04 ENCOUNTER — Other Ambulatory Visit: Payer: Self-pay

## 2019-10-04 ENCOUNTER — Ambulatory Visit (INDEPENDENT_AMBULATORY_CARE_PROVIDER_SITE_OTHER): Payer: Managed Care, Other (non HMO) | Admitting: Surgery

## 2019-10-04 VITALS — BP 142/84 | HR 86 | Temp 95.0°F | Ht 71.0 in | Wt 245.0 lb

## 2019-10-04 DIAGNOSIS — K4090 Unilateral inguinal hernia, without obstruction or gangrene, not specified as recurrent: Secondary | ICD-10-CM

## 2019-10-04 NOTE — H&P (View-Only) (Signed)
Patient ID: Mark Austin, male   DOB: [birth year], 39 y.o.   MRN: [id number]  HPI Mark Austin is a 39 y.o. male seen in consultation at the request of Dr. Kerman Passey for a symptomatic right inguinal hernia.  He reports that he started having pain for the last 3 months or so.  The pain is intermittent sharp in nature and moderate to severe intensity.  He also reports that he feels like a hot knife is being stabbed into the right groin.  Pain radiates to the right testicle.  No fevers no chills.  Of note he had a history of [place] well as sigmoid colectomy and cholecystectomy.  He does have a history of chronic back pain and he is allergic to Toradol. He did have a CT scan that have personally reviewed showing evidence of a right inguinal hernia.  No evidence of incarceration or strangulation.  He is able to perform more than 6 METS of activity without any shortness of breath or chest pain.  No nausea no vomiting no fevers no chills no weight loss.  Labs personally reviewed including a CBC and a CMP were completely normal.  HPI  Past Medical History:  Diagnosis Date  . Back ache   . Diverticulitis   . Hypertension     Past Surgical History:  Procedure Laterality Date  . back surgeries    . BACK SURGERY    . BOWEL RESECTION    . CHOLECYSTECTOMY  2017  . INGUINAL HERNIA REPAIR Left   . SHOULDER SURGERY      Family History  Problem Relation Age of Onset  . Healthy Mother   . Healthy Father     Social History Social History   Tobacco Use  . Smoking status: Current Every Day Smoker    Packs/day: 0.75  . Smokeless tobacco: Never Used  Substance Use Topics  . Alcohol use: Never  . Drug use: Not Currently    Allergies  Allergen Reactions  . Toradol [Ketorolac Tromethamine] Other (See Comments)    seizures  . Tramadol Other (See Comments)    Seizures     Current Outpatient Medications  Medication Sig Dispense Refill  .  acetaminophen (TYLENOL) 325 MG tablet Take by mouth.    Marland Kitchen amLODipine (NORVASC) 5 MG tablet Take 5 mg by mouth daily.    . cyclobenzaprine (FLEXERIL) 5 MG tablet Take by mouth.    Marland Kitchen lisinopril (ZESTRIL) 40 MG tablet     . prazosin (MINIPRESS) 1 MG capsule Take by mouth.    . propranolol (INDERAL) 80 MG tablet     . tapentadol (NUCYNTA ER) 100 MG 12 hr tablet Take by mouth.     No current facility-administered medications for this visit.     Review of Systems Full ROS  was asked and was negative except for the information on the HPI  Physical Exam Blood pressure (!) 142/84, pulse 86, temperature (!) 95 F (35 C), temperature source Temporal, height 5\' 11"  (1.803 m), weight 245 lb (111.1 kg), SpO2 97 %. CONSTITUTIONAL: NAD EYES: Pupils are equal, round, and reactive to light, Sclera are non-icteric. EARS, NOSE, MOUTH AND THROAT: Wearing a mask. Marland Kitchen Hearing is intact to voice. LYMPH NODES:  Lymph nodes in the neck are normal. RESPIRATORY:  Lungs are clear. There is normal respiratory effort, with equal breath sounds bilaterally, and without pathologic use of accessory muscles. CARDIOVASCULAR: Heart is regular without murmurs, gallops, or rubs. GI:  The abdomen is  soft, nontender, and nondistended. There are no palpable masses. There is no hepatosplenomegaly. There are normal bowel sounds in all quadrants. Reducible but tender IH right side, no peritonitis GU: Penis w/o lesion. No testicular masses. Tender right testicle. MUSCULOSKELETAL: Normal muscle strength and tone. No cyanosis or edema.   SKIN: Turgor is good and there are no pathologic skin lesions or ulcers. NEUROLOGIC: Motor and sensation is grossly normal. Cranial nerves are grossly intact. PSYCH:  Oriented to person, place and time. Affect is normal.  Data Reviewed  I have personally reviewed the patient's imaging, laboratory findings and medical records.    Assessment/Plan Symptomatic right inguinal hernia.  Discussed with  patient in detail about my recommendation for repair.  Also discussed with him that the most important indicator of postoperative pain is preoperative pain.  I do think that he is a good candidate for robotic approach.  Procedure discussed with the patient in detail.  Risk, benefits and possible complications including but not limited to: Bleeding, chronic pain, recurrence mesh issues and bowel injuries were discussed with him in detail.  He understands and wishes to proceed. A copy of this report was sent to the referring provider    Caroleen Hamman, MD FACS General Surgeon 10/04/2019, 2:25 PM

## 2019-10-04 NOTE — Patient Instructions (Addendum)
Our surgery scheduler Marzetta Board will contact you within the next 24-48 hours for surgery. When Stacy contacts you, during that call she will discuss the preparation prior to surgery, dates and times. Please have the BLUE sheet available when she contacts you. If you have any questions or concerns, please give our office a call.   Inguinal Hernia, Adult An inguinal hernia develops when fat or the intestines push through a weak spot in a muscle where your leg meets your lower abdomen (groin). This creates a bulge. This kind of hernia could also be:  In your scrotum, if you are male.  In folds of skin around your vagina, if you are male. There are three types of inguinal hernias:  Hernias that can be pushed back into the abdomen (are reducible). This type rarely causes pain.  Hernias that are not reducible (are incarcerated).  Hernias that are not reducible and lose their blood supply (are strangulated). This type of hernia requires emergency surgery. What are the causes? This condition is caused by having a weak spot in the muscles or tissues in the groin. This weak spot develops over time. The hernia may poke through the weak spot when you suddenly strain your lower abdominal muscles, such as when you:  Lift a heavy object.  Strain to have a bowel movement. Constipation can lead to straining.  Cough. What increases the risk? This condition is more likely to develop in:  Men.  Pregnant women.  People who: ? Are overweight. ? Work in jobs that require long periods of standing or heavy lifting. ? Have had an inguinal hernia before. ? Smoke or have lung disease. These factors can lead to long-lasting (chronic) coughing. What are the signs or symptoms? Symptoms may depend on the size of the hernia. Often, a small inguinal hernia has no symptoms. Symptoms of a larger hernia may include:  A lump in the groin area. This is easier to see when standing. It might not be visible when lying  down.  Pain or burning in the groin. This may get worse when lifting, straining, or coughing.  A dull ache or a feeling of pressure in the groin.  In men, an unusual lump in the scrotum. Symptoms of a strangulated inguinal hernia may include:  A bulge in your groin that is very painful and tender to the touch.  A bulge that turns red or purple.  Fever, nausea, and vomiting.  Inability to have a bowel movement or to pass gas. How is this diagnosed? This condition is diagnosed based on your symptoms, your medical history, and a physical exam. Your health care provider may feel your groin area and ask you to cough. How is this treated? Treatment depends on the size of your hernia and whether you have symptoms. If you do not have symptoms, your health care provider may have you watch your hernia carefully and have you come in for follow-up visits. If your hernia is large or if you have symptoms, you may need surgery to repair the hernia. Follow these instructions at home: Lifestyle  Avoid lifting heavy objects.  Avoid standing for long periods of time.  Do not use any products that contain nicotine or tobacco, such as cigarettes and e-cigarettes. If you need help quitting, ask your health care provider.  Maintain a healthy weight. Preventing constipation  Take actions to prevent constipation. Constipation leads to straining with bowel movements, which can make a hernia worse or cause a hernia repair to break down.  Your health care provider may recommend that you: ? Drink enough fluid to keep your urine pale yellow. ? Eat foods that are high in fiber, such as fresh fruits and vegetables, whole grains, and beans. ? Limit foods that are high in fat and processed sugars, such as fried or sweet foods. ? Take an over-the-counter or prescription medicine for constipation. General instructions  You may try to push the hernia back in place by very gently pressing on it while lying down. Do  not try to force the bulge back in if it will not push in easily.  Watch your hernia for any changes in shape, size, or color. Get help right away if you notice any changes.  Take over-the-counter and prescription medicines only as told by your health care provider.  Keep all follow-up visits as told by your health care provider. This is important. Contact a health care provider if:  You have a fever.  You develop new symptoms.  Your symptoms get worse. Get help right away if:  You have pain in your groin that suddenly gets worse.  You have a bulge in your groin that: ? Suddenly gets bigger and does not get smaller. ? Becomes red or purple or painful to the touch.  You are a man and you have a sudden pain in your scrotum, or the size of your scrotum suddenly changes.  You cannot push the hernia back in place by very gently pressing on it when you are lying down. Do not try to force the bulge back in if it will not push in easily.  You have nausea or vomiting that does not go away.  You have a fast heartbeat.  You cannot have a bowel movement or pass gas. These symptoms may represent a serious problem that is an emergency. Do not wait to see if the symptoms will go away. Get medical help right away. Call your local emergency services (911 in the [place] Summary  An inguinal hernia develops when fat or the intestines push through a weak spot in a muscle where your leg meets your lower abdomen (groin).  This condition is caused by having a weak spot in muscles or tissue in your groin.  Symptoms may depend on the size of the hernia, and they may include pain or swelling in your groin. A small inguinal hernia often has no symptoms.  Treatment may not be needed if you do not have symptoms. If you have symptoms or a large hernia, you may need surgery to repair the hernia.  Avoid lifting heavy objects. Also avoid standing for long amounts of time. This information is not intended to  replace advice given to you by your health care provider. Make sure you discuss any questions you have with your health care provider. Document Revised: 08/16/2017 Document Reviewed: 04/16/2017 Elsevier Patient Education  [place].   Laparoscopic Inguinal Hernia Repair, Adult, Care After This sheet gives you information about how to care for yourself after your procedure. Your health care provider may also give you more specific instructions. If you have problems or questions, contact your health care provider. What can I expect after the procedure? After the procedure, it is common to have:  Pain.  Swelling and bruising around the incision area.  Scrotal swelling, in men.  Some fluid or blood draining from your incisions. Follow these instructions at home: Incision care  Follow instructions from your health care provider about how to take care of your incisions.  Make sure you: ? Wash your hands with soap and water before you change your bandage (dressing). If soap and water are not available, use hand sanitizer. ? Change your dressing as told by your health care provider. ? Leave stitches (sutures), skin glue, or adhesive strips in place. These skin closures may need to stay in place for 2 weeks or longer. If adhesive strip edges start to loosen and curl up, you may trim the loose edges. Do not remove adhesive strips completely unless your health care provider tells you to do that.  Check your incision area every day for signs of infection. Check for: ? More redness, swelling, or pain. ? More fluid or blood. ? Warmth. ? Pus or a bad smell.  Wear loose, soft clothing while your incisions heal. Driving  Do not drive or use heavy machinery while taking prescription pain medicine.  Do not drive for 24 hours if you were given a medicine to help you relax (sedative) during your procedure. Activity  Do not lift anything that is heavier than 10 lb (4.5 kg), or the limit that  you are told, until your health care provider says that it is safe.  Ask your health care provider what activities are safe for you. A lot of activity during the first week after surgery can increase pain and swelling. For 1 week after your procedure: ? Avoid activities that take a lot of effort, such as exercise or sports. ? You may walk and climb stairs as needed for daily activity, but avoid long walks or climbing stairs for exercise. Managing pain and swelling   Put ice on painful or swollen areas: ? Put ice in a plastic bag. ? Place a towel between your skin and the bag. ? Leave the ice on for 20 minutes, 2-3 times a day. General instructions  Do not take baths, swim, or use a hot tub until your health care provider approves. Ask your health care provider if you may take showers. You may only be allowed to take sponge baths.  Take over-the-counter and prescription medicines only as told by your health care provider.  To prevent or treat constipation while you are taking prescription pain medicine, your health care provider may recommend that you: ? Drink enough fluid to keep your urine pale yellow. ? Take over-the-counter or prescription medicines. ? Eat foods that are high in fiber, such as fresh fruits and vegetables, whole grains, and beans. ? Limit foods that are high in fat and processed sugars, such as fried and sweet foods.  Do not use any products that contain nicotine or tobacco, such as cigarettes and e-cigarettes. If you need help quitting, ask your health care provider.  Drink enough fluid to keep your urine pale yellow.  Keep all follow-up visits as told by your health care provider. This is important. Contact a health care provider if:  You have more redness, swelling, or pain around your incisions or your groin area.  You have more swelling in your scrotum.  You have more fluid or blood coming from your incisions.  Your incisions feel warm to the touch.  You  have severe pain and medicines do not help.  You have abdominal pain or swelling.  You cannot eat or drink without vomiting.  You cannot urinate or pass a bowel movement.  You faint.  You feel dizzy.  You have nausea and vomiting.  You have a fever. Get help right away if:  You have pus or a  bad smell coming from your incisions.  You have redness, warmth, or pain in your leg.  You have chest pain.  You have problems breathing. Summary  Pain, swelling, and bruising are common after the procedure.  Check your incision area every day for signs of infection, such as more redness, swelling, or pain.  Put ice on painful or swollen areas for 20 minutes, 2-3 times a day. This information is not intended to replace advice given to you by your health care provider. Make sure you discuss any questions you have with your health care provider. Document Revised: 12/23/2018 Document Reviewed: 10/24/2016 Elsevier Patient Education  [place].

## 2019-10-04 NOTE — Progress Notes (Signed)
Patient ID: Mark Austin, male   DOB: [birth year], 39 y.o.   MRN: [id number]  HPI Mark Austin is a 39 y.o. male seen in consultation at the request of Dr. Kerman Passey for a symptomatic right inguinal hernia.  He reports that he started having pain for the last 3 months or so.  The pain is intermittent sharp in nature and moderate to severe intensity.  He also reports that he feels like a hot knife is being stabbed into the right groin.  Pain radiates to the right testicle.  No fevers no chills.  Of note he had a history of prior [place] ingu[place] the [place] as well as sigmoid colectomy and cholecystectomy.  He does have a history of chronic back pain and he is allergic to Toradol. He did have a CT scan that have personally reviewed showing evidence of a right inguinal hernia.  No evidence of incarceration or strangulation.  He is able to perform more than 6 METS of activity without any shortness of breath or chest pain.  No nausea no vomiting no fevers no chills no weight loss.  Labs personally reviewed including a CBC and a CMP were completely normal.  HPI  Past Medical History:  Diagnosis Date  . Back ache   . Diverticulitis   . Hypertension     Past Surgical History:  Procedure Laterality Date  . back surgeries    . BACK SURGERY    . BOWEL RESECTION    . CHOLECYSTECTOMY  2017  . INGUINAL HERNIA REPAIR Left   . SHOULDER SURGERY      Family History  Problem Relation Age of Onset  . Healthy Mother   . Healthy Father     Social History Social History   Tobacco Use  . Smoking status: Current Every Day Smoker    Packs/day: 0.75  . Smokeless tobacco: Never Used  Substance Use Topics  . Alcohol use: Never  . Drug use: Not Currently    Allergies  Allergen Reactions  . Toradol [Ketorolac Tromethamine] Other (See Comments)    seizures  . Tramadol Other (See Comments)    Seizures     Current Outpatient Medications  Medication Sig Dispense Refill  .  acetaminophen (TYLENOL) 325 MG tablet Take by mouth.    Marland Kitchen amLODipine (NORVASC) 5 MG tablet Take 5 mg by mouth daily.    . cyclobenzaprine (FLEXERIL) 5 MG tablet Take by mouth.    Marland Kitchen lisinopril (ZESTRIL) 40 MG tablet     . prazosin (MINIPRESS) 1 MG capsule Take by mouth.    . propranolol (INDERAL) 80 MG tablet     . tapentadol (NUCYNTA ER) 100 MG 12 hr tablet Take by mouth.     No current facility-administered medications for this visit.     Review of Systems Full ROS  was asked and was negative except for the information on the HPI  Physical Exam Blood pressure (!) 142/84, pulse 86, temperature (!) 95 F (35 C), temperature source Temporal, height 5\' 11"  (1.803 m), weight 245 lb (111.1 kg), SpO2 97 %. CONSTITUTIONAL: NAD EYES: Pupils are equal, round, and reactive to light, Sclera are non-icteric. EARS, NOSE, MOUTH AND THROAT: Wearing a mask. Marland Kitchen Hearing is intact to voice. LYMPH NODES:  Lymph nodes in the neck are normal. RESPIRATORY:  Lungs are clear. There is normal respiratory effort, with equal breath sounds bilaterally, and without pathologic use of accessory muscles. CARDIOVASCULAR: Heart is regular without murmurs, gallops, or rubs. GI:  The abdomen is  soft, nontender, and nondistended. There are no palpable masses. There is no hepatosplenomegaly. There are normal bowel sounds in all quadrants. Reducible but tender IH right side, no peritonitis GU: Penis w/o lesion. No testicular masses. Tender right testicle. MUSCULOSKELETAL: Normal muscle strength and tone. No cyanosis or edema.   SKIN: Turgor is good and there are no pathologic skin lesions or ulcers. NEUROLOGIC: Motor and sensation is grossly normal. Cranial nerves are grossly intact. PSYCH:  Oriented to person, place and time. Affect is normal.  Data Reviewed  I have personally reviewed the patient's imaging, laboratory findings and medical records.    Assessment/Plan Symptomatic right inguinal hernia.  Discussed with  patient in detail about my recommendation for repair.  Also discussed with him that the most important indicator of postoperative pain is preoperative pain.  I do think that he is a good candidate for robotic approach.  Procedure discussed with the patient in detail.  Risk, benefits and possible complications including but not limited to: Bleeding, chronic pain, recurrence mesh issues and bowel injuries were discussed with him in detail.  He understands and wishes to proceed. A copy of this report was sent to the referring provider    Caroleen Hamman, MD FACS General Surgeon 10/04/2019, 2:25 PM

## 2019-10-05 ENCOUNTER — Telehealth: Payer: Self-pay | Admitting: Surgery

## 2019-10-05 NOTE — Telephone Encounter (Signed)
During he call to discuss upcoming surgery & pre-surgical prep info, the pt was curious to know he approximate recovery & down time for this type of px.  He is aware that due to the clinical nature of his question, a msg will be routed to a member of the clinical team to address on the following business day.  Thank you

## 2019-10-05 NOTE — Telephone Encounter (Signed)
Pt has been advised of pre admission date/time, Covid Testing date and Surgery date.  Surgery Date: 10/15/19 Preadmission Testing Date: 10/11/19 (phone 8a-1p) Covid Testing Date: 10/13/19 - patient advised to go to the [facility] ([place])  Patient has been made aware to call [phone], between 1-3:00pm the day before surgery, to find out what time to arrive.

## 2019-10-06 ENCOUNTER — Telehealth: Payer: Self-pay | Admitting: Surgery

## 2019-10-06 NOTE — Telephone Encounter (Signed)
Pt has been advised of pre admission date/time, Covid Testing date and Surgery date.  Surgery Date: 10/08/19 Preadmission Testing Date: 2 hrs prior on day of sx Covid Testing Date: 10/07/19 - patient advised to go to the [facility] ([place])  Patient has been made aware to call [phone], between 1-3:00pm the day before surgery, to find out what time to arrive.

## 2019-10-06 NOTE — Telephone Encounter (Signed)
Patient notified that he can expect to be out of work for 1-2 weeks following surgery depending on how strenuous his job is. He will have 6 weeks of lifting restrictions of no more than 20 pounds. He is aware to have any paperwork or FMLA forms sent over to our office to be filled out.

## 2019-10-07 ENCOUNTER — Other Ambulatory Visit
Admission: RE | Admit: 2019-10-07 | Discharge: 2019-10-07 | Disposition: A | Discharge status: Home / Self Care | Payer: Managed Care, Other (non HMO) | Source: Ambulatory Visit | Attending: Surgery | Admitting: Surgery

## 2019-10-07 ENCOUNTER — Other Ambulatory Visit: Payer: Self-pay

## 2019-10-07 DIAGNOSIS — Z20822 Contact with and (suspected) exposure to covid-19: Secondary | ICD-10-CM | POA: Diagnosis present

## 2019-10-07 LAB — SARS CORONAVIRUS 2 (TAT 6-24 HRS): SARS Coronavirus 2: NEGATIVE

## 2019-10-08 ENCOUNTER — Encounter: Payer: Self-pay | Admitting: Surgery

## 2019-10-08 ENCOUNTER — Ambulatory Visit: Payer: Managed Care, Other (non HMO) | Admitting: Anesthesiology

## 2019-10-08 ENCOUNTER — Other Ambulatory Visit: Payer: Self-pay

## 2019-10-08 ENCOUNTER — Encounter
Admission: RE | Disposition: A | Discharge status: Home / Self Care | Payer: Self-pay | Source: Home / Self Care | Attending: Surgery

## 2019-10-08 ENCOUNTER — Ambulatory Visit
Admission: RE | Admit: 2019-10-08 | Discharge: 2019-10-08 | Disposition: A | Discharge status: Home / Self Care | Payer: Managed Care, Other (non HMO) | Attending: Surgery | Admitting: Surgery

## 2019-10-08 DIAGNOSIS — K4090 Unilateral inguinal hernia, without obstruction or gangrene, not specified as recurrent: Secondary | ICD-10-CM | POA: Diagnosis present

## 2019-10-08 DIAGNOSIS — F17210 Nicotine dependence, cigarettes, uncomplicated: Secondary | ICD-10-CM | POA: Diagnosis not present

## 2019-10-08 DIAGNOSIS — Z885 Allergy status to narcotic agent status: Secondary | ICD-10-CM | POA: Insufficient documentation

## 2019-10-08 DIAGNOSIS — Z888 Allergy status to other drugs, medicaments and biological substances status: Secondary | ICD-10-CM | POA: Insufficient documentation

## 2019-10-08 DIAGNOSIS — I10 Essential (primary) hypertension: Secondary | ICD-10-CM | POA: Diagnosis not present

## 2019-10-08 DIAGNOSIS — Z79899 Other long term (current) drug therapy: Secondary | ICD-10-CM | POA: Insufficient documentation

## 2019-10-08 DIAGNOSIS — Z9049 Acquired absence of other specified parts of digestive tract: Secondary | ICD-10-CM | POA: Diagnosis not present

## 2019-10-08 HISTORY — PX: XI ROBOTIC ASSISTED INGUINAL HERNIA REPAIR WITH MESH: SHX6706

## 2019-10-08 SURGERY — REPAIR, HERNIA, INGUINAL, ROBOT-ASSISTED, LAPAROSCOPIC, USING MESH
Anesthesia: General | Site: Inguinal | Laterality: Right

## 2019-10-08 MED ORDER — PROMETHAZINE HCL 25 MG/ML IJ SOLN: 6.2500 mg | INTRAMUSCULAR | Status: DC | PRN

## 2019-10-08 MED ORDER — OXYCODONE-ACETAMINOPHEN 5-325 MG PO TABS: 1.0000 | ORAL_TABLET | ORAL | 0 refills | Status: AC | PRN

## 2019-10-08 MED ORDER — SODIUM CHLORIDE FLUSH 0.9 % IV SOLN
INTRAVENOUS | Status: AC
  Filled 2019-10-08: qty 10

## 2019-10-08 MED ORDER — PROPOFOL 10 MG/ML IV BOLUS
INTRAVENOUS | Status: AC
  Filled 2019-10-08: qty 20

## 2019-10-08 MED ORDER — FENTANYL CITRATE (PF) 100 MCG/2ML IJ SOLN
25.0000 ug | INTRAMUSCULAR | Status: AC | PRN
  Administered 2019-10-08 (×4): 25 ug via INTRAVENOUS

## 2019-10-08 MED ORDER — HYDROMORPHONE HCL 1 MG/ML IJ SOLN
INTRAMUSCULAR | Status: AC
  Administered 2019-10-08: 0.5 mg via INTRAVENOUS
  Filled 2019-10-08: qty 1

## 2019-10-08 MED ORDER — ROCURONIUM BROMIDE 100 MG/10ML IV SOLN
INTRAVENOUS | Status: DC | PRN
  Administered 2019-10-08: 70 mg via INTRAVENOUS
  Administered 2019-10-08: 20 mg via INTRAVENOUS

## 2019-10-08 MED ORDER — LIDOCAINE HCL (PF) 2 % IJ SOLN
INTRAMUSCULAR | Status: AC
  Filled 2019-10-08: qty 5

## 2019-10-08 MED ORDER — FENTANYL CITRATE (PF) 100 MCG/2ML IJ SOLN
INTRAMUSCULAR | Status: DC | PRN
  Administered 2019-10-08: 100 ug via INTRAVENOUS

## 2019-10-08 MED ORDER — GABAPENTIN 300 MG PO CAPS: 300.0000 mg | ORAL_CAPSULE | ORAL | Status: AC

## 2019-10-08 MED ORDER — MIDAZOLAM HCL 2 MG/2ML IJ SOLN
INTRAMUSCULAR | Status: DC | PRN
  Administered 2019-10-08: 2 mg via INTRAVENOUS

## 2019-10-08 MED ORDER — MIDAZOLAM HCL 2 MG/2ML IJ SOLN
INTRAMUSCULAR | Status: AC
  Filled 2019-10-08: qty 2

## 2019-10-08 MED ORDER — ROCURONIUM BROMIDE 10 MG/ML (PF) SYRINGE
PREFILLED_SYRINGE | INTRAVENOUS | Status: AC
  Filled 2019-10-08: qty 10

## 2019-10-08 MED ORDER — BUPIVACAINE LIPOSOME 1.3 % IJ SUSP
INTRAMUSCULAR | Status: AC
  Filled 2019-10-08: qty 20

## 2019-10-08 MED ORDER — FENTANYL CITRATE (PF) 100 MCG/2ML IJ SOLN
INTRAMUSCULAR | Status: AC
  Filled 2019-10-08: qty 2

## 2019-10-08 MED ORDER — ONDANSETRON HCL 4 MG/2ML IJ SOLN
INTRAMUSCULAR | Status: DC | PRN
  Administered 2019-10-08: 4 mg via INTRAVENOUS

## 2019-10-08 MED ORDER — ACETAMINOPHEN 500 MG PO TABS
ORAL_TABLET | ORAL | Status: AC
  Administered 2019-10-08: 1000 mg via ORAL
  Filled 2019-10-08: qty 2

## 2019-10-08 MED ORDER — LIDOCAINE HCL (CARDIAC) PF 100 MG/5ML IV SOSY
PREFILLED_SYRINGE | INTRAVENOUS | Status: DC | PRN
  Administered 2019-10-08: 100 mg via INTRAVENOUS

## 2019-10-08 MED ORDER — ALPRAZOLAM 0.5 MG PO TABS
ORAL_TABLET | ORAL | Status: AC
  Administered 2019-10-08: 0.5 mg via ORAL
  Filled 2019-10-08: qty 1

## 2019-10-08 MED ORDER — DEXAMETHASONE SODIUM PHOSPHATE 10 MG/ML IJ SOLN
INTRAMUSCULAR | Status: DC | PRN
  Administered 2019-10-08: 10 mg via INTRAVENOUS

## 2019-10-08 MED ORDER — OXYCODONE HCL 5 MG PO TABS: 5.0000 mg | ORAL_TABLET | Freq: Once | ORAL | Status: AC | PRN

## 2019-10-08 MED ORDER — BUPIVACAINE-EPINEPHRINE 0.25% -1:200000 IJ SOLN
INTRAMUSCULAR | Status: DC | PRN
  Administered 2019-10-08: 30 mL

## 2019-10-08 MED ORDER — OXYCODONE HCL 5 MG PO TABS
ORAL_TABLET | ORAL | Status: AC
  Administered 2019-10-08: 5 mg via ORAL
  Filled 2019-10-08: qty 1

## 2019-10-08 MED ORDER — LACTATED RINGERS IV SOLN: INTRAVENOUS | Status: DC | PRN

## 2019-10-08 MED ORDER — HYDROMORPHONE HCL 1 MG/ML IJ SOLN
0.5000 mg | INTRAMUSCULAR | Status: AC | PRN
  Administered 2019-10-08 (×2): 0.5 mg via INTRAVENOUS

## 2019-10-08 MED ORDER — BUPIVACAINE HCL (PF) 0.25 % IJ SOLN
INTRAMUSCULAR | Status: AC
  Filled 2019-10-08: qty 30

## 2019-10-08 MED ORDER — PROPOFOL 10 MG/ML IV BOLUS
INTRAVENOUS | Status: DC | PRN
  Administered 2019-10-08: 50 mg via INTRAVENOUS
  Administered 2019-10-08: 200 mg via INTRAVENOUS
  Administered 2019-10-08: 50 mg via INTRAVENOUS

## 2019-10-08 MED ORDER — ALPRAZOLAM 0.5 MG PO TABS: 0.5000 mg | ORAL_TABLET | Freq: Once | ORAL | Status: AC

## 2019-10-08 MED ORDER — FENTANYL CITRATE (PF) 100 MCG/2ML IJ SOLN
INTRAMUSCULAR | Status: AC
  Administered 2019-10-08: 25 ug via INTRAVENOUS
  Filled 2019-10-08: qty 2

## 2019-10-08 MED ORDER — CEFAZOLIN SODIUM-DEXTROSE 2-4 GM/100ML-% IV SOLN
2.0000 g | INTRAVENOUS | Status: AC
  Administered 2019-10-08: 2 g via INTRAVENOUS

## 2019-10-08 MED ORDER — EPINEPHRINE PF 1 MG/ML IJ SOLN
INTRAMUSCULAR | Status: AC
  Filled 2019-10-08: qty 1

## 2019-10-08 MED ORDER — SUGAMMADEX SODIUM 500 MG/5ML IV SOLN
INTRAVENOUS | Status: DC | PRN
  Administered 2019-10-08: 200 mg via INTRAVENOUS

## 2019-10-08 MED ORDER — CHLORHEXIDINE GLUCONATE CLOTH 2 % EX PADS
6.0000 | MEDICATED_PAD | Freq: Once | CUTANEOUS | Status: AC
  Administered 2019-10-08: 6 via TOPICAL

## 2019-10-08 MED ORDER — BUPIVACAINE LIPOSOME 1.3 % IJ SUSP
INTRAMUSCULAR | Status: DC | PRN
  Administered 2019-10-08: 20 mL

## 2019-10-08 MED ORDER — ACETAMINOPHEN 500 MG PO TABS: 1000.0000 mg | ORAL_TABLET | ORAL | Status: AC

## 2019-10-08 MED ORDER — LACTATED RINGERS IV SOLN: INTRAVENOUS | Status: DC

## 2019-10-08 MED ORDER — SEVOFLURANE IN SOLN
RESPIRATORY_TRACT | Status: AC
  Filled 2019-10-08: qty 250

## 2019-10-08 MED ORDER — OXYCODONE HCL 5 MG/5ML PO SOLN: 5.0000 mg | Freq: Once | ORAL | Status: AC | PRN

## 2019-10-08 MED ORDER — GABAPENTIN 300 MG PO CAPS
ORAL_CAPSULE | ORAL | Status: AC
  Administered 2019-10-08: 300 mg via ORAL
  Filled 2019-10-08: qty 1

## 2019-10-08 MED ORDER — CEFAZOLIN SODIUM-DEXTROSE 2-4 GM/100ML-% IV SOLN
INTRAVENOUS | Status: AC
  Filled 2019-10-08: qty 100

## 2019-10-08 SURGICAL SUPPLY — 44 items
CANISTER SUCT 1200ML W/VALVE (MISCELLANEOUS) ×2 IMPLANT
CHLORAPREP W/TINT 26 (MISCELLANEOUS) ×2 IMPLANT
COVER TIP SHEARS 8 DVNC (MISCELLANEOUS) ×1 IMPLANT
COVER TIP SHEARS 8MM DA VINCI (MISCELLANEOUS) ×1
COVER WAND RF STERILE (DRAPES) ×2 IMPLANT
DEFOGGER SCOPE WARMER CLEARIFY (MISCELLANEOUS) ×2 IMPLANT
DERMABOND ADVANCED (GAUZE/BANDAGES/DRESSINGS) ×1
DERMABOND ADVANCED .7 DNX12 (GAUZE/BANDAGES/DRESSINGS) ×1 IMPLANT
DRAPE 3/4 80X56 (DRAPES) ×2 IMPLANT
DRAPE ARM DVNC X/XI (DISPOSABLE) ×3 IMPLANT
DRAPE COLUMN DVNC XI (DISPOSABLE) ×1 IMPLANT
DRAPE DA VINCI XI ARM (DISPOSABLE) ×3
DRAPE DA VINCI XI COLUMN (DISPOSABLE) ×1
ELECT CAUTERY BLADE 6.4 (BLADE) ×2 IMPLANT
ELECT REM PT RETURN 9FT ADLT (ELECTROSURGICAL) ×2
ELECTRODE REM PT RTRN 9FT ADLT (ELECTROSURGICAL) ×1 IMPLANT
GLOVE BIO SURGEON STRL SZ7 (GLOVE) ×8 IMPLANT
GOWN STRL REUS W/ TWL LRG LVL3 (GOWN DISPOSABLE) ×4 IMPLANT
GOWN STRL REUS W/TWL LRG LVL3 (GOWN DISPOSABLE) ×4
IRRIGATION STRYKERFLOW (MISCELLANEOUS) IMPLANT
IRRIGATOR STRYKERFLOW (MISCELLANEOUS)
IV NS 1000ML (IV SOLUTION)
IV NS 1000ML BAXH (IV SOLUTION) IMPLANT
KIT PINK PAD W/HEAD ARE REST (MISCELLANEOUS) ×2
KIT PINK PAD W/HEAD ARM REST (MISCELLANEOUS) ×1 IMPLANT
LABEL OR SOLS (LABEL) ×2 IMPLANT
MESH 3DMAX 4X6 RT LRG (Mesh General) ×2 IMPLANT
NEEDLE HYPO 22GX1.5 SAFETY (NEEDLE) ×2 IMPLANT
OBTURATOR OPTICAL STANDARD 8MM (TROCAR) ×1
OBTURATOR OPTICAL STND 8 DVNC (TROCAR) ×1
OBTURATOR OPTICALSTD 8 DVNC (TROCAR) ×1 IMPLANT
PACK LAP CHOLECYSTECTOMY (MISCELLANEOUS) ×2 IMPLANT
PENCIL ELECTRO HAND CTR (MISCELLANEOUS) ×2 IMPLANT
SEAL CANN UNIV 5-8 DVNC XI (MISCELLANEOUS) ×3 IMPLANT
SEAL XI 5MM-8MM UNIVERSAL (MISCELLANEOUS) ×3
SOLUTION ELECTROLUBE (MISCELLANEOUS) ×2 IMPLANT
SPONGE LAP 18X18 RF (DISPOSABLE) ×2 IMPLANT
SUT MNCRL AB 4-0 PS2 18 (SUTURE) ×2 IMPLANT
SUT VIC AB 2-0 SH 27 (SUTURE) ×1
SUT VIC AB 2-0 SH 27XBRD (SUTURE) ×1 IMPLANT
SUT VICRYL 0 AB UR-6 (SUTURE) ×4 IMPLANT
SUT VLOC 90 S/L VL9 GS22 (SUTURE) ×2 IMPLANT
TROCAR BALLN GELPORT 12X130M (ENDOMECHANICALS) ×2 IMPLANT
TUBING EVAC SMOKE HEATED PNEUM (TUBING) ×2 IMPLANT

## 2019-10-08 NOTE — Transfer of Care (Signed)
Immediate Anesthesia Transfer of Care Note  Patient: Mark Austin  Procedure(s) Performed: XI ROBOTIC ASSISTED INGUINAL HERNIA REPAIR WITH MESH (Right Inguinal)  Patient Location: PACU  Anesthesia Type:General  Level of Consciousness: awake, alert  and oriented  Airway & Oxygen Therapy: Patient Spontanous Breathing  Post-op Assessment: Report given to RN  Post vital signs: Reviewed and stable  Last Vitals:  Vitals Value Taken Time  BP    Temp    Pulse    Resp 11 10/08/19 1334  SpO2    Vitals shown include unvalidated device data.  Last Pain:  Vitals:   10/08/19 1131  PainSc: 6          Complications: No apparent anesthesia complications

## 2019-10-08 NOTE — Op Note (Signed)
Robotic assisted Laparoscopic Transabdominal Right Inguinal Hernia Repair with 3 D large Mesh       Pre-operative Diagnosis:  Right Inguinal Hernia   Post-operative Diagnosis: Same   Procedure: Robotic  Laparoscopic  repair of Right inguinal hernia   Surgeon: Caroleen Hamman, MD FACS   Anesthesia: Gen. with endotracheal tube   Findings: indirect inguinal hernia        Procedure Details  The patient was seen again in the Holding Room. The benefits, complications, treatment options, and expected outcomes were discussed with the patient. The risks of bleeding, infection, recurrence of symptoms, failure to resolve symptoms, recurrence of hernia, ischemic orchitis, chronic pain syndrome or neuroma, were discussed again. The likelihood of improving the patient's symptoms with return to their baseline status is good.  The patient and/or family concurred with the proposed plan, giving informed consent.  The patient was taken to Operating Room, identified  and the procedure verified as Laparoscopic Inguinal Hernia Repair. Laterality confirmed.  A Time Out was held and the above information confirmed.   Prior to the induction of general anesthesia, antibiotic prophylaxis was administered. VTE prophylaxis was in place. General endotracheal anesthesia was then administered and tolerated well. After the induction, the abdomen was prepped with Chloraprep and draped in the sterile fashion. The patient was positioned in the supine position.     Supraumbilical incision created and cut down technique used to enter the abdominal cavity. Fascia elevated and incised and hasson trochar placed. Pneumoperitoneum obtained w/o HD changes. No evidence of bowel injuries.  Two 8 mm placed under direct vision. The laparoscopy revealed large indirect defects. I inserted the needles and the mesh. The robot was brought ot the table and docked in the standard fashion, no collision between arms was observed. Instruments were kept  under direct view at all times. We started on the right side were a flap was created. The sac was reduced and dissected free from adjacent structures. We preserved the vas and the vessels. Once dissection was completed a large 3D mesh was placed and secured with two interrupted vicryl attached to the pubic tubercle. There was good coverage of the direct, indirect and femoral spaces. The flap was closed with v lock suture. Second look revealed no complications or injuries.  Once assuring that hemostasis was adequate the ports were removed and a figure-of-eight 0 Vicryl suture was placed at the fascial edges. 4-0 subcuticular Monocryl was used at all skin edges. Dermabond was placed.  Patient tolerated the procedure well. There were no complications. He was taken to the recovery room in stable condition.                 Caroleen Hamman, MD, FACS

## 2019-10-08 NOTE — Anesthesia Procedure Notes (Signed)
Procedure Name: Intubation Performed by: Lesle Reek, CRNA Pre-anesthesia Checklist: Timeout performed, Patient being monitored, Suction available, Emergency Drugs available and Patient identified Patient Re-evaluated:Patient Re-evaluated prior to induction Oxygen Delivery Method: Circle system utilized Preoxygenation: Pre-oxygenation with 100% oxygen Induction Type: IV induction Laryngoscope Size: Mac and 4 Grade View: Grade I Tube type: Oral Tube size: 7.5 mm Number of attempts: 1 Airway Equipment and Method: Stylet Placement Confirmation: ETT inserted through vocal cords under direct vision,  CO2 detector,  positive ETCO2 and breath sounds checked- equal and bilateral Secured at: 22 cm Tube secured with: Tape

## 2019-10-08 NOTE — Anesthesia Preprocedure Evaluation (Addendum)
Anesthesia Evaluation  Patient identified by MRN, date of birth, ID band Patient awake    Reviewed: Allergy & Precautions, H&P , NPO status , Patient's Chart, lab work & pertinent test results  Airway Mallampati: II  TM Distance: >3 FB Neck ROM: full    Dental  (+) Teeth Intact   Pulmonary sleep apnea (lost 40 lbs, does not think it's an issue anymore, does not use CPAP) , Current Smoker and Patient abstained from smoking.,           Cardiovascular hypertension, (-) angina(-) Past MI and (-) Cardiac Stents (-) dysrhythmias      Neuro/Psych negative neurological ROS  negative psych ROS   GI/Hepatic negative GI ROS, Neg liver ROS,   Endo/Other  negative endocrine ROS  Renal/GU      Musculoskeletal   Abdominal   Peds  Hematology negative hematology ROS (+)   Anesthesia Other Findings Past Medical History: No date: Back ache No date: Diverticulitis No date: Hypertension  Past Surgical History: No date: back surgeries No date: BACK SURGERY No date: BOWEL RESECTION 2017: CHOLECYSTECTOMY No date: INGUINAL HERNIA REPAIR; Left No date: SHOULDER SURGERY     Reproductive/Obstetrics negative OB ROS                            Anesthesia Physical Anesthesia Plan  ASA: II  Anesthesia Plan: General ETT   Post-op Pain Management:    Induction:   PONV Risk Score and Plan: Ondansetron, Dexamethasone, Midazolam and Treatment may vary due to age or medical condition  Airway Management Planned:   Additional Equipment:   Intra-op Plan:   Post-operative Plan:   Informed Consent: I have reviewed the patients History and Physical, chart, labs and discussed the procedure including the risks, benefits and alternatives for the proposed anesthesia with the patient or authorized representative who has indicated his/her understanding and acceptance.     Dental Advisory Given  Plan Discussed  with: Anesthesiologist  Anesthesia Plan Comments:         Anesthesia Quick Evaluation

## 2019-10-08 NOTE — Discharge Instructions (Addendum)
Laparoscopic Inguinal Hernia Repair, Adult, Care After This sheet gives you information about how to care for yourself after your procedure. Your health care provider may also give you more specific instructions. If you have problems or questions, contact your health care provider. What can I expect after the procedure? After the procedure, it is common to have:  Pain.  Swelling and bruising around the incision area.  Scrotal swelling, in men.  Some fluid or blood draining from your incisions. Follow these instructions at home: Incision care  Follow instructions from your health care provider about how to take care of your incisions. Make sure you: ? Wash your hands with soap and water before you change your bandage (dressing). If soap and water are not available, use hand sanitizer. ? Change your dressing as told by your health care provider. ? Leave stitches (sutures), skin glue, or adhesive strips in place. These skin closures may need to stay in place for 2 weeks or longer. If adhesive strip edges start to loosen and curl up, you may trim the loose edges. Do not remove adhesive strips completely unless your health care provider tells you to do that.  Check your incision area every day for signs of infection. Check for: ? More redness, swelling, or pain. ? More fluid or blood. ? Warmth. ? Pus or a bad smell.  Wear loose, soft clothing while your incisions heal. Driving  Do not drive or use heavy machinery while taking prescription pain medicine.  Do not drive for 24 hours if you were given a medicine to help you relax (sedative) during your procedure. Activity  Do not lift anything that is heavier than 10 lb (4.5 kg), or the limit that you are told, until your health care provider says that it is safe.  Ask your health care provider what activities are safe for you. A lot of activity during the first week after surgery can increase pain and swelling. For 1 week after your  procedure: ? Avoid activities that take a lot of effort, such as exercise or sports. ? You may walk and climb stairs as needed for daily activity, but avoid long walks or climbing stairs for exercise. Managing pain and swelling   Put ice on painful or swollen areas: ? Put ice in a plastic bag. ? Place a towel between your skin and the bag. ? Leave the ice on for 20 minutes, 2-3 times a day. General instructions  Do not take baths, swim, or use a hot tub until your health care provider approves. Ask your health care provider if you may take showers. You may only be allowed to take sponge baths.  Take over-the-counter and prescription medicines only as told by your health care provider.  To prevent or treat constipation while you are taking prescription pain medicine, your health care provider may recommend that you: ? Drink enough fluid to keep your urine pale yellow. ? Take over-the-counter or prescription medicines. ? Eat foods that are high in fiber, such as fresh fruits and vegetables, whole grains, and beans. ? Limit foods that are high in fat and processed sugars, such as fried and sweet foods.  Do not use any products that contain nicotine or tobacco, such as cigarettes and e-cigarettes. If you need help quitting, ask your health care provider.  Drink enough fluid to keep your urine pale yellow.  Keep all follow-up visits as told by your health care provider. This is important. Contact a health care provider if:    You have more redness, swelling, or pain around your incisions or your groin area.  You have more swelling in your scrotum.  You have more fluid or blood coming from your incisions.  Your incisions feel warm to the touch.  You have severe pain and medicines do not help.  You have abdominal pain or swelling.  You cannot eat or drink without vomiting.  You cannot urinate or pass a bowel movement.  You faint.  You feel dizzy.  You have nausea and  vomiting.  You have a fever. Get help right away if:  You have pus or a bad smell coming from your incisions.  You have redness, warmth, or pain in your leg.  You have chest pain.  You have problems breathing. Summary  Pain, swelling, and bruising are common after the procedure.  Check your incision area every day for signs of infection, such as more redness, swelling, or pain.  Put ice on painful or swollen areas for 20 minutes, 2-3 times a day. This information is not intended to replace advice given to you by your health care provider. Make sure you discuss any questions you have with your health care provider. Document Revised: 12/23/2018 Document Reviewed: 10/24/2016 Elsevier Patient Education  2020 [employer]   AMBULATORY SURGERY  DISCHARGE INSTRUCTIONS   1) The drugs that you were given will stay in your system until tomorrow so for the next 24 hours you should not:  A) Drive an automobile B) Make any legal decisions C) Drink any alcoholic beverage   2) You may resume regular meals tomorrow.  Today it is better to start with liquids and gradually work up to solid foods.  You may eat anything you prefer, but it is better to start with liquids, then soup and crackers, and gradually work up to solid foods.   3) Please notify your doctor immediately if you have any unusual bleeding, trouble breathing, redness and pain at the surgery site, drainage, fever, or pain not relieved by medication.    4) Additional Instructions:        Please contact your physician with any problems or Same Day Surgery at [phone], Monday through Friday 6 am to 4 pm, or [facility] number at [phone]. 

## 2019-10-08 NOTE — Interval H&P Note (Signed)
History and Physical Interval Note:  10/08/2019 11:33 AM  Mark Austin  has presented today for surgery, with the diagnosis of Right inguinal hernia.  The various methods of treatment have been discussed with the patient and family. After consideration of risks, benefits and other options for treatment, the patient has consented to  Procedure(s): XI ROBOTIC [place] (Right) as a surgical intervention.  The patient's history has been reviewed, patient examined, no change in status, stable for surgery.  I have reviewed the patient's chart and labs.  Questions were answered to the patient's satisfaction.     [place]

## 2019-10-10 NOTE — Anesthesia Postprocedure Evaluation (Signed)
Anesthesia Post Note  Patient: Mark Austin  Procedure(s) Performed: XI ROBOTIC ASSISTED INGUINAL HERNIA REPAIR WITH MESH (Right Inguinal)  Patient location during evaluation: PACU Anesthesia Type: General Level of consciousness: awake and alert Pain management: pain level controlled Vital Signs Assessment: post-procedure vital signs reviewed and stable Respiratory status: spontaneous breathing, nonlabored ventilation, respiratory function stable and patient connected to nasal cannula oxygen Cardiovascular status: blood pressure returned to baseline and stable Postop Assessment: no apparent nausea or vomiting Anesthetic complications: no     Last Vitals:  Vitals:   10/08/19 1506 10/08/19 1519  BP: (!) 133/56 127/65  Pulse: 82 81  Resp:    Temp: (!) 36.2 C   SpO2: 96% 98%    Last Pain:  Vitals:   10/08/19 1506  TempSrc: Tympanic  PainSc: 6                  Arita Miss

## 2019-10-11 ENCOUNTER — Inpatient Hospital Stay: Admission: RE | Admit: 2019-10-11 | Payer: Managed Care, Other (non HMO) | Source: Ambulatory Visit

## 2019-10-13 ENCOUNTER — Telehealth: Payer: Self-pay | Admitting: *Deleted

## 2019-10-13 ENCOUNTER — Other Ambulatory Visit: Payer: Managed Care, Other (non HMO)

## 2019-10-13 NOTE — Telephone Encounter (Signed)
Faxed FMLA to [phone]

## 2019-10-25 ENCOUNTER — Telehealth (INDEPENDENT_AMBULATORY_CARE_PROVIDER_SITE_OTHER): Payer: Managed Care, Other (non HMO) | Admitting: Surgery

## 2019-10-25 ENCOUNTER — Other Ambulatory Visit: Payer: Self-pay

## 2019-10-25 DIAGNOSIS — Z09 Encounter for follow-up examination after completed treatment for conditions other than malignant neoplasm: Secondary | ICD-10-CM

## 2019-10-25 NOTE — Progress Notes (Signed)
Virtual visit in the form of a phone call placed to his cell phone.  He is doing very well after robotic right inguinal hernia.  He experiences no pain.  The first day or so had some pain but now it is completely gone.  No fevers no chills he started a regular diet.  He states that he is got lesion on his buttocks that he wants me to take a look at it in a few weeks.  He is currently out of town. A/P Doing very well without any complications.  We will be happy to take a look at the skin lesion around his buttocks whenever he is back into town and whenever is convenient for him.  No other surgical issues at this time

## 2019-11-07 ENCOUNTER — Emergency Department
Admission: EM | Admit: 2019-11-07 | Discharge: 2019-11-07 | Disposition: A | Discharge status: Home / Self Care | Payer: Managed Care, Other (non HMO) | Attending: Emergency Medicine | Admitting: Emergency Medicine

## 2019-11-07 ENCOUNTER — Emergency Department: Payer: Managed Care, Other (non HMO)

## 2019-11-07 ENCOUNTER — Encounter: Payer: Self-pay | Admitting: Emergency Medicine

## 2019-11-07 ENCOUNTER — Other Ambulatory Visit: Payer: Self-pay

## 2019-11-07 DIAGNOSIS — I10 Essential (primary) hypertension: Secondary | ICD-10-CM | POA: Diagnosis not present

## 2019-11-07 DIAGNOSIS — Z79899 Other long term (current) drug therapy: Secondary | ICD-10-CM | POA: Insufficient documentation

## 2019-11-07 DIAGNOSIS — F17210 Nicotine dependence, cigarettes, uncomplicated: Secondary | ICD-10-CM | POA: Insufficient documentation

## 2019-11-07 DIAGNOSIS — R42 Dizziness and giddiness: Secondary | ICD-10-CM | POA: Insufficient documentation

## 2019-11-07 DIAGNOSIS — R079 Chest pain, unspecified: Secondary | ICD-10-CM

## 2019-11-07 DIAGNOSIS — R29898 Other symptoms and signs involving the musculoskeletal system: Secondary | ICD-10-CM | POA: Insufficient documentation

## 2019-11-07 LAB — CBC
HCT: 51.6 % (ref 39.0–52.0)
Hemoglobin: 17.9 g/dL — ABNORMAL HIGH (ref 13.0–17.0)
MCH: 31.1 pg (ref 26.0–34.0)
MCHC: 34.7 g/dL (ref 30.0–36.0)
MCV: 89.7 fL (ref 80.0–100.0)
Platelets: 192 10*3/uL (ref 150–400)
RBC: 5.75 MIL/uL (ref 4.22–5.81)
RDW: 12.5 % (ref 11.5–15.5)
WBC: 7.6 10*3/uL (ref 4.0–10.5)
nRBC: 0 % (ref 0.0–0.2)

## 2019-11-07 LAB — BASIC METABOLIC PANEL
Anion gap: 9 (ref 5–15)
BUN: 9 mg/dL (ref 6–20)
CO2: 25 mmol/L (ref 22–32)
Calcium: 9.5 mg/dL (ref 8.9–10.3)
Chloride: 103 mmol/L (ref 98–111)
Creatinine, Ser: 0.88 mg/dL (ref 0.61–1.24)
GFR calc Af Amer: >60 mL/min (ref >60)
GFR calc non Af Amer: >60 mL/min (ref >60)
Glucose, Bld: 103 mg/dL — ABNORMAL HIGH (ref 70–99)
Potassium: 3.6 mmol/L (ref 3.5–5.1)
Sodium: 137 mmol/L (ref 135–145)

## 2019-11-07 LAB — TROPONIN I (HIGH SENSITIVITY)
Troponin I (High Sensitivity): 4 ng/L (ref <18)
Troponin I (High Sensitivity): 4 ng/L (ref <18)

## 2019-11-07 LAB — FIBRIN DERIVATIVES D-DIMER (ARMC ONLY): Fibrin derivatives D-dimer (ARMC): 364.4 ng/mL (FEU) (ref 0.00–499.00)

## 2019-11-07 IMAGING — CR DG CHEST 2V
1 series · 2 of 2 positions shown · non-contrast
Comparison: None.

CLINICAL DATA: 39-year-old male with chest pain.

EXAM:
CHEST - 2 VIEW

[Series 1: dg chest 2 view · 0.14mm/px · 2 of 2 slices shown]
[im 1/2]
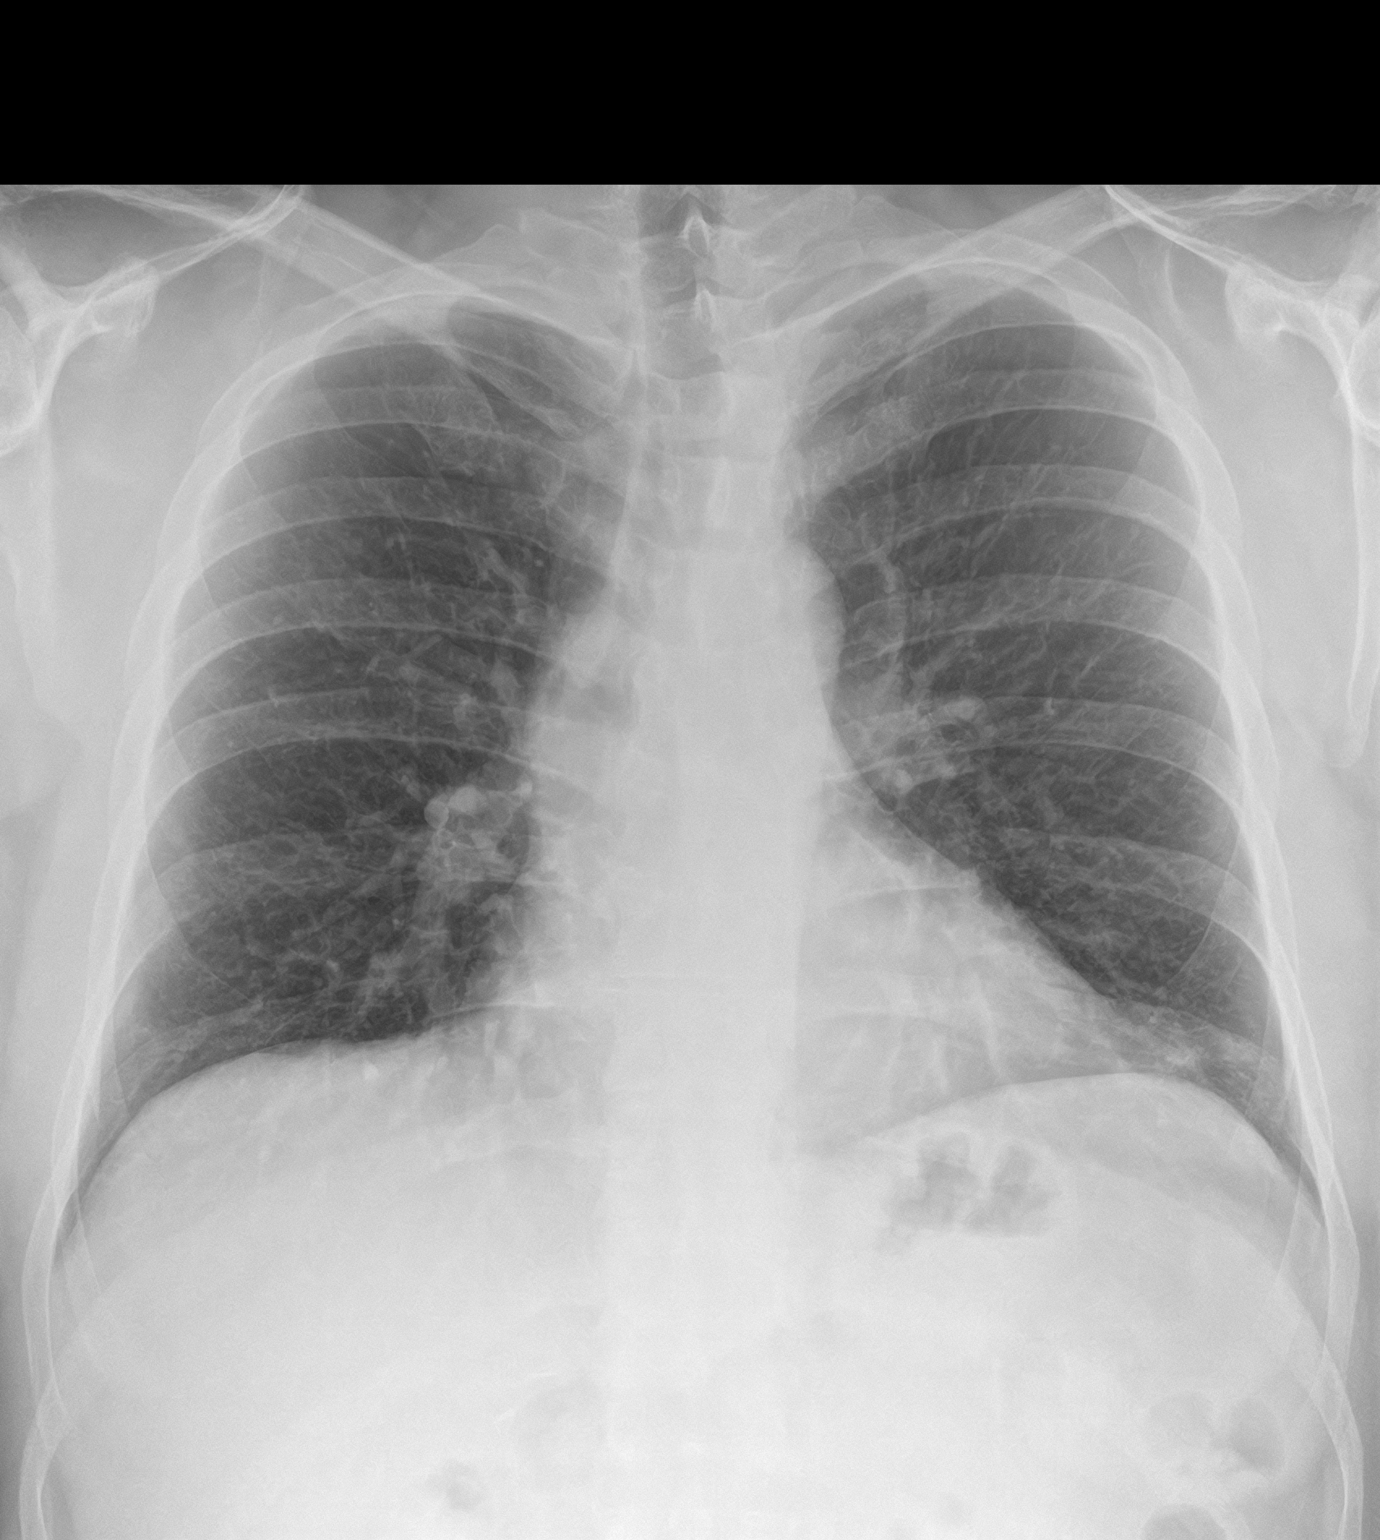
[im 2/2]
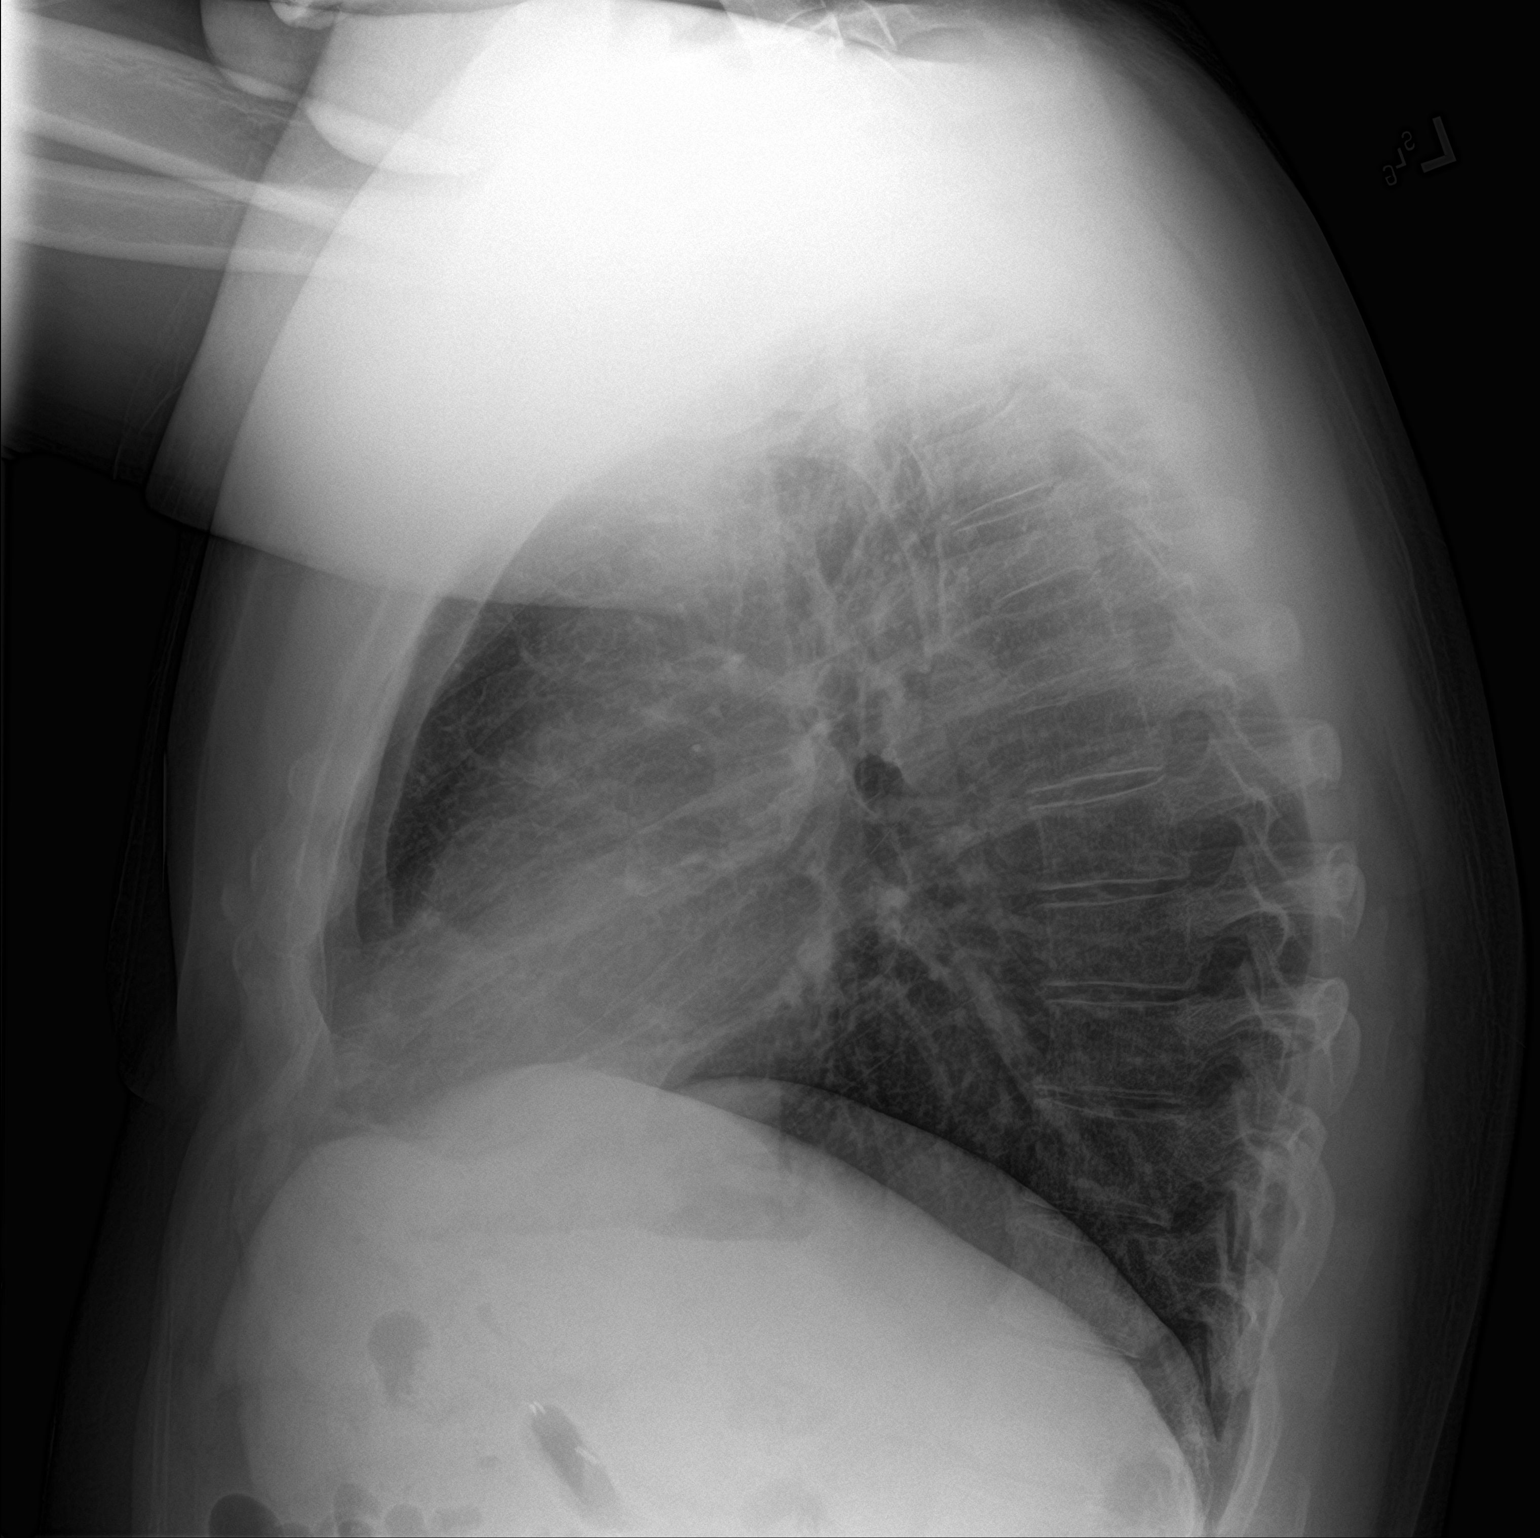

[2 of 2 positions shown; findings below may reference images not displayed]

FINDINGS: The heart size and mediastinal contours are within normal limits.
Both lungs are clear. The visualized skeletal structures are
unremarkable.
IMPRESSION: No active cardiopulmonary disease.

## 2019-11-07 MED ORDER — SODIUM CHLORIDE 0.9% FLUSH: 3.0000 mL | Freq: Once | INTRAVENOUS | Status: DC

## 2019-11-07 NOTE — ED Notes (Signed)
E-signature pad unavailable at time of discharge. Pt given discharge paperwork and teaching provided, pt voices understanding of teaching and has no questions.

## 2019-11-07 NOTE — Discharge Instructions (Addendum)
Your cardiac markers were negative, chest x-ray without evidence of pneumonia.  You should follow-up with cardiology due to your risk factors.  Return to the ER for worsening chest pain, shortness of breath, dizziness or any other concerns.  Your blood pressure was elevated today and will need a recheck within 1 week

## 2019-11-07 NOTE — ED Notes (Signed)
Pt reports chest pain has improved, repeat troponin sent, informed patient waiting for room to see EDP. PT verbalized understanding, denies any needs at this time.

## 2019-11-07 NOTE — ED Provider Notes (Signed)
[facility] Emergency Department Provider Note  ____________________________________________   First MD Initiated Contact with Patient 11/07/19 2122     (approximate)  I have reviewed the triage vital signs and the nursing notes.   HISTORY  Chief Complaint Chest Pain and Dizziness    HPI Mark Austin is a 39 y.o. male with hypertension who comes in with left-sided chest pain and dizziness that occurred 25 minutes prior to arrival.  No prior history of cardiac issues.  This occurred while he was working in the yard but states that he was just planting some seedlings.  Denies any heavy lifting.  States he started off with both arms feeling heavy and then having some chest discomfort.  Patient stated that lasted few hours, went away on its own, nothing made it better, unclear what brought it on.  Denies having this previously.  Patient did have some dizziness with it and a little bit of catching his breath.  Patient symptoms have now since completely resolved.  Patient states that he does have hypertensive and has been followed by the [place] and they are adjusting his medications for the.  He is currently on 3 medications.  Patient does smoke as well.          Past Medical History:  Diagnosis Date  . Back ache   . Diverticulitis   . Hypertension     There are no problems to display for this patient.   Past Surgical History:  Procedure Laterality Date  . back surgeries    . BACK SURGERY    . BOWEL RESECTION    . CHOLECYSTECTOMY  2017  . INGUINAL HERNIA REPAIR Left   . SHOULDER SURGERY    . XI ROBOTIC ASSISTED INGUINAL HERNIA REPAIR WITH MESH Right 10/08/2019   Procedure: XI ROBOTIC ASSISTED INGUINAL HERNIA REPAIR WITH MESH;  Surgeon: Jules Husbands, MD;  Location: [facility] ORS;  Service: General;  Laterality: Right;    Prior to Admission medications   Medication Sig Start Date End Date Taking? Authorizing Provider  amLODipine (NORVASC) 5 MG tablet Take 5  mg by mouth daily. 09/16/19   [provider]  cyclobenzaprine (FLEXERIL) 10 MG tablet Take 10 mg by mouth 3 (three) times daily as needed for muscle spasms.    [provider]  lisinopril (ZESTRIL) 40 MG tablet Take 40 mg by mouth daily.  07/07/19   [provider]  morphine (MSIR) 15 MG tablet Take 15 mg by mouth in the morning and at bedtime.    [provider]  prazosin (MINIPRESS) 1 MG capsule Take 3 mg by mouth at bedtime.     [provider]  propranolol (INDERAL) 80 MG tablet Take 160 mg by mouth daily.  07/07/19   [provider]  tapentadol (NUCYNTA ER) 100 MG 12 hr tablet Take 100 mg by mouth in the morning and at bedtime.  07/06/10   [provider]    Allergies Toradol [ketorolac tromethamine] and Tramadol  Family History  Problem Relation Age of Onset  . Healthy Mother   . Healthy Father     Social History Social History   Tobacco Use  . Smoking status: Current Every Day Smoker    Packs/day: 0.75  . Smokeless tobacco: Never Used  Substance Use Topics  . Alcohol use: Never  . Drug use: Not Currently      Review of Systems Constitutional: No fever/chills Eyes: No visual changes. ENT: No sore throat. Cardiovascular: Positive chest  pain, arm pain Respiratory: Denies shortness of breath. Gastrointestinal: No abdominal pain.  No nausea, no vomiting.  No diarrhea.  No constipation. Genitourinary: Negative for dysuria. Musculoskeletal: Negative for back pain. Skin: Negative for rash. Neurological: Negative for headaches, focal weakness or numbness. All other ROS negative ____________________________________________   PHYSICAL EXAM:  VITAL SIGNS: ED Triage Vitals  Enc Vitals Group     BP 11/07/19 1551 (!) 160/105     Pulse Rate 11/07/19 1551 (!) 109     Resp 11/07/19 1551 18     Temp 11/07/19 1551 98 F (36.7 C)     Temp src --      SpO2 11/07/19 1551 97 %     Weight 11/07/19 1553 220 lb (99.8  kg)     Height 11/07/19 1553 5\' 11"  (1.803 m)     Head Circumference --      Peak Flow --      Pain Score 11/07/19 1830 5     Pain Loc --      Pain Edu? --      Excl. in [place]? --     Constitutional: Alert and oriented. Well appearing and in no acute distress. Eyes: Conjunctivae are normal. EOMI. Head: Atraumatic. Nose: No congestion/rhinnorhea. Mouth/Throat: Mucous membranes are moist.   Neck: No stridor. Trachea Midline. FROM Cardiovascular: Tachycardia initially but upon my assessment patient is normal sinus, regular rhythm. Grossly normal heart sounds.  Good peripheral circulation. Respiratory: Normal respiratory effort.  No retractions. Lungs CTAB. Gastrointestinal: Soft and nontender. No distention. No abdominal bruits.  Musculoskeletal: No lower extremity tenderness nor edema.  No joint effusions. Neurologic:  Normal speech and language. No gross focal neurologic deficits are appreciated.  Skin:  Skin is warm, dry and intact. No rash noted. Psychiatric: Mood and affect are normal. Speech and behavior are normal. GU: Deferred   ____________________________________________   LABS (all labs ordered are listed, but only abnormal results are displayed)  Labs Reviewed  BASIC METABOLIC PANEL - Abnormal; Notable for the following components:      Result Value   Glucose, Bld 103 (*)    All other components within normal limits  CBC - Abnormal; Notable for the following components:   Hemoglobin 17.9 (*)    All other components within normal limits  FIBRIN DERIVATIVES D-DIMER ([facility] ONLY)  TROPONIN I (HIGH SENSITIVITY)  TROPONIN I (HIGH SENSITIVITY)   ____________________________________________   ED ECG REPORT I, Vanessa [place], the attending physician, personally viewed and interpreted this ECG.  EKG is sinus tachycardia rate of 109, no ST elevations, no T wave inversions, S1 every 3 T3 ____________________________________________  RADIOLOGY Robert Bellow, personally  viewed and evaluated these images (plain radiographs) as part of my medical decision making, as well as reviewing the written report by the radiologist.  ED MD interpretation: No pneumonia  Official radiology report(s): DG Chest 2 View  Result Date: 11/07/2019 CLINICAL DATA:  39 year old male with chest pain. EXAM: CHEST - 2 VIEW COMPARISON:  None. FINDINGS: The heart size and mediastinal contours are within normal limits. Both lungs are clear. The visualized skeletal structures are unremarkable. IMPRESSION: No active cardiopulmonary disease. Electronically Signed   By: Anner Crete M.D.   On: 11/07/2019 16:47    ____________________________________________   PROCEDURES  Procedure(s) performed (including Critical Care):  .1-3 Lead EKG Interpretation Performed by: Vanessa [place], MD Authorized by: Vanessa [place], MD     Interpretation: normal     ECG rate:  80s  ECG rate assessment: normal     Rhythm: sinus rhythm     Ectopy: none     Conduction: normal       ____________________________________________   INITIAL IMPRESSION / ASSESSMENT AND PLAN / ED COURSE   Mandela Abbot was evaluated in Emergency Department on 11/07/2019 for the symptoms described in the history of present illness. He was evaluated in the context of the global COVID-19 pandemic, which necessitated consideration that the patient might be at risk for infection with the SARS-CoV-2 virus that causes COVID-19. Institutional protocols and algorithms that pertain to the evaluation of patients at risk for COVID-19 are in a state of rapid change based on information released by regulatory bodies including the CDC and federal and state organizations. These policies and algorithms were followed during the patient's care in the ED.    Most Likely DDx:  -MSK (atypical chest pain) but will get cardiac markers to evaluate for ACS given risk factors/age -Given his tachycardia and some shortness of breath also get D-dimer  to evaluate for PE.  Patient is a low Wells score given no other risk factors.  We will keep patient on the cardiac monitor to evaluate for arrhythmia  DDx that was also considered d/t potential to cause harm, but was found less likely based on history and physical (as detailed above): -PNA (no fevers, cough but CXR to evaluate) -PNX (reassured with equal b/l breath sounds, CXR to evaluate) -Symptomatic anemia (will get H&H) -Aortic Dissection as no tearing pain and no radiation to the mid back, pulses equal -Pericarditis no rub on exam, EKG changes or hx to suggest dx -Tamponade (no notable SOB, tachycardic, hypotensive) -Esophageal rupture (no h/o diffuse vomitting/no crepitus)  Cardiac markers negative x2 D-dimer rules out for PE No evidence of anemia or infection Kidney function is normal Chest x-ray negative.   Patient symptoms have since all resolved.  Given patient does have some risk factors and is hypertensive we will give patient cardiology follow-up.  Patient feels comfortable this plan and feels comfortable following up outpatient.  I discussed the provisional nature of ED diagnosis, the treatment so far, the ongoing plan of care, follow up appointments and return precautions with the patient and any family or support people present. They expressed understanding and agreed with the plan, discharged home.   ____________________________________________   FINAL CLINICAL IMPRESSION(S) / ED DIAGNOSES   Final diagnoses:  Dizziness  Chest pain, unspecified type     MEDICATIONS GIVEN DURING THIS VISIT:  Medications  sodium chloride flush (NS) 0.9 % injection 3 mL (has no administration in time range)     ED Discharge Orders    None       Note:  This document was prepared using Dragon voice recognition software and may include unintentional dictation errors.   Vanessa [place], MD 11/07/19 2209

## 2019-11-07 NOTE — ED Triage Notes (Signed)
Pt to ER with c/o left sided chest pain and dizziness sudden onset 25 mins PTA.  Pt states mild SHOB when it started, but that has subsided.  Pt states left arm feels "weird", pain with movement of right arm.  Pt states was in yard working when pain began.  Pt denies cardiac hx.

## 2019-11-10 ENCOUNTER — Encounter: Payer: Self-pay | Admitting: Surgery

## 2019-11-10 ENCOUNTER — Other Ambulatory Visit: Payer: Self-pay

## 2019-11-10 ENCOUNTER — Ambulatory Visit (INDEPENDENT_AMBULATORY_CARE_PROVIDER_SITE_OTHER): Payer: Managed Care, Other (non HMO) | Admitting: Surgery

## 2019-11-10 VITALS — BP 160/108 | HR 85 | Temp 97.9°F | Resp 12 | Wt 242.6 lb

## 2019-11-10 DIAGNOSIS — R222 Localized swelling, mass and lump, trunk: Secondary | ICD-10-CM

## 2019-11-10 NOTE — Progress Notes (Signed)
Outpatient Surgical Follow Up  11/10/2019  Mark Austin is an 39 y.o. male.   Chief Complaint  Patient presents with  . Follow-up    mass-left side above buttock     HPI: 39 year old male well-known to me with a recent history of robotic Right  inguinal hernia repair with mesh.  He did very well from the surgical perspective and reports no issues and no symptoms.  He is here for a left upper buttocks nodule that has for several years.  He did have a nerve stimulator at some point in time and was excised.  He feels a nodule and some discomfort.  Sometimes very occasional mild pain that is intermittent.  No fevers no chills.  I did review his CT scan personally and there is a 2 and half centimeter nodule in the subcu on the upper buttocks.  This seems to be firm and same density units as the muscle.  Past Medical History:  Diagnosis Date  . Back ache   . Diverticulitis   . Hypertension     Past Surgical History:  Procedure Laterality Date  . back surgeries    . BACK SURGERY    . BOWEL RESECTION    . CHOLECYSTECTOMY  2017  . INGUINAL HERNIA REPAIR Left   . SHOULDER SURGERY    . XI ROBOTIC ASSISTED INGUINAL HERNIA REPAIR WITH MESH Right 10/08/2019   Procedure: XI ROBOTIC ASSISTED INGUINAL HERNIA REPAIR WITH MESH;  Surgeon: Jules Husbands, MD;  Location: [facility] ORS;  Service: General;  Laterality: Right;    Family History  Problem Relation Age of Onset  . Healthy Mother   . Healthy Father     Social History:  reports that he has been smoking. He has been smoking about 0.75 packs per day. He has never used smokeless tobacco. He reports previous drug use. He reports that he does not drink alcohol.  Allergies:  Allergies  Allergen Reactions  . Toradol [Ketorolac Tromethamine] Other (See Comments)    seizures  . Tramadol Other (See Comments)    Seizures     Medications reviewed.    ROS Full ROS performed and is otherwise negative other than what is stated in HPI   BP (!)  160/108   Pulse 85   Temp 97.9 F (36.6 C)   Resp 12   Wt 242 lb 9.6 oz (110 kg)   SpO2 97%   BMI 33.84 kg/m   Physical Exam NAD, alert Abd: soft, incisions healed, no infection, no hernia recurrence Skin: Left upper buttocks sub q nodule, firm measures 2.5 cms  Assessment/Plan:  1. Buttocks nodule Is note that this is completely different and separate from his previous recent robotic inguinal hernia that he is recovering very well.  We will be happy to schedule removal of nodule here in the office at his convenience.  Procedure discussed with patient detail.  Benefit and possible complication clear but not limited to: Bleeding , infection, recurrence.   Greater than 50% of the 25 minutes  visit was spent in counseling/coordination of care   Caroleen Hamman, MD [place] [facility]

## 2019-11-10 NOTE — Patient Instructions (Signed)
Please see your appointment below.   Excision of Skin Lesions Excision of a skin lesion is the removal of a section of skin by making small cuts (incisions) in the skin. Through this process, the lesion is completely removed. This procedure is often done to treat or prevent cancer or infection. It may also be done to improve cosmetic appearance. The procedure may be done to remove:  Cancerous (malignant) growths, such as basal cell carcinoma, squamous cell carcinoma, or melanoma.  Noncancerous (benign) growths, such as a cyst or lipoma.  Growths, such as moles or skin tags, which may be removed for cosmetic reasons. Various excision or surgical techniques may be used depending on your condition, the location of the lesion, and your overall health. Tell a health care provider about:  Any allergies you have.  All medicines you are taking, including vitamins, herbs, eye drops, creams, and over-the-counter medicines.  Any problems you or family members have had with anesthetic medicines.  Any blood disorders you have.  Any surgeries you have had.  Any medical conditions you have or have had.  Whether you are pregnant or may be pregnant. What are the risks? Generally, this is a safe procedure. However, problems may occur, including:  Bleeding.  Infection.  Scarring.  Recurrence of the cyst, lipoma, or cancer.  Changes in skin sensation or appearance, such as discoloration or swelling.  Reaction to the anesthetics.  Allergic reaction to surgical materials or ointments.  Damage to nerves, blood vessels, muscles, or other structures.  Continued pain. What happens before the procedure? Medicines Ask your health care provider about:  Changing or stopping your regular medicines. This is especially important if you are taking diabetes medicines or blood thinners.  Taking medicines such as aspirin and ibuprofen. These medicines can thin your blood. Do not take these medicines  unless your health care provider tells you to take them.  Taking over-the-counter medicines, vitamins, herbs, and supplements. General instructions  You may be asked to stop smoking.  You may have an exam or testing.  Ask your health care provider what steps will be taken to help prevent infection. These may include: ? Removing hair at the surgery site. ? Washing skin with a germ-killing soap. ? Taking antibiotic medicine. What happens during the procedure?   You will be given a medicine to numb the area (local anesthetic).  Your health care provider will remove the lesions using one of the following excision techniques. ? Complete surgical excision. This procedure may be done to treat a cancerous growth or a noncancerous cyst or lesion.  A small scalpel or scissors will be used to gently cut around and under the lesion until it is completely removed.  If bleeding occurs, it will be stopped with a device that delivers heat (electrocautery).  The edges of the wound may be stitched (sutured) together.  A bandage (dressing) will be applied.  Samples will be sent to a lab for testing. ? Excision of a cyst.  An incision will be made on the cyst.  The entire cyst will be removed through the incision.  The incision may be closed with sutures. ? Shave excision. This may be done to remove a mole or a skin tag.  A small blade or an electrically heated loop instrument will be used to shave off the lesion.  The wound is usually left to heal on its own without sutures. ? Punch excision. This may be done to remove a mole or a scar or  to do a biopsy of the lesion.  A small tool that is like a cookie cutter or a hole punch is used to cut a circle shape out of the skin.  The outer edges of the skin will be sutured together.  The sample may be sent to a lab for testing. ? Mohs micrographic surgery. This is usually done to treat skin cancer. This type of excision is mostly used on the  face and ears. This procedure is minimally invasive, and it ensures the best cosmetic outcome.  A scalpel or a loop instrument will be used to remove layers of the lesion until all the abnormal or cancerous tissue has been removed.  The wound may be sutured, depending on its size.  The tissue will be checked under a microscope right away. Each of the techniques may vary among health care providers and hospitals. At the end of any of these procedures, antibiotic ointment will be applied as needed. What happens after the procedure?  Return to your normal activities as told by your health care provider. Ask your health care provider what activities are safe for you.  It is up to you to get the results of your procedure. Ask your doctor, or the department that is doing the procedure, when your results will be ready.  Talk with your health care provider to discuss any test results, treatment options, and if necessary, the need for more tests.  Keep all follow-up visits as told by your health care provider. This is important. Summary  Excision of a skin lesion is the removal of a section of skin by making small cuts (incisions) in the skin. This procedure is often done to treat or prevent cancer and infection, or it may be done to improve cosmetic appearance.  Various excision or surgical techniques may be used depending on your condition, the location of the lesion, and your overall health.  After the procedure, talk with your health care provider to discuss any test results, treatment options, and if necessary, the need for more tests.  Keep all follow-up visits as told by your health care provider. This is important. This information is not intended to replace advice given to you by your health care provider. Make sure you discuss any questions you have with your health care provider. Document Revised: 01/21/2018 Document Reviewed: 01/21/2018 Elsevier Patient Education  [employer].

## 2019-11-19 ENCOUNTER — Telehealth: Payer: Self-pay | Admitting: *Deleted

## 2019-11-19 NOTE — Telephone Encounter (Signed)
Mark Austin spoke with patient and informed him that per Dr.Pabon he would need to come in for an office visit and discuss having the lipomas removed in the OR. Patient was notified and inform that his upcoming appointment scheduled for Monday will be changed from an procedure appointment it will be an office visit. Patient verbalized understanding and has no further questions.

## 2019-11-19 NOTE — Telephone Encounter (Signed)
Patient called and stated that he has a procedure to removal a lipoma on his back side on Monday by Dr Dahlia Byes. Patient is worried about being awake to do this and was wondering if he can be put under anesthesia  at [facility] to do this. Please call and advise

## 2019-11-22 ENCOUNTER — Other Ambulatory Visit: Payer: Self-pay

## 2019-11-22 ENCOUNTER — Encounter: Payer: Self-pay | Admitting: Surgery

## 2019-11-22 ENCOUNTER — Ambulatory Visit (INDEPENDENT_AMBULATORY_CARE_PROVIDER_SITE_OTHER): Payer: Managed Care, Other (non HMO) | Admitting: Surgery

## 2019-11-22 ENCOUNTER — Telehealth: Payer: Self-pay | Admitting: Surgery

## 2019-11-22 VITALS — BP 153/99 | HR 88 | Temp 93.2°F | Ht 71.0 in | Wt 245.6 lb

## 2019-11-22 DIAGNOSIS — R222 Localized swelling, mass and lump, trunk: Secondary | ICD-10-CM

## 2019-11-22 NOTE — Progress Notes (Addendum)
Outpatient Surgical Follow Up  11/22/2019  Mark Austin is an 39 y.o. male.   Chief Complaint  Patient presents with  . Follow-up     follow up excision of Nodule Left Buttock    HPI: 39 year old male well-known to me with a history of symptomatic left posterior hip subcutaneous nodule.  He reports some intermittent pain that is mild to moderate intensity and sharp in nature.  No specific alleviating or aggravating factors.  He wishes to have this excised.  Initially he was going to have this excised in the office but now he prefers to do it in the OR with at least some conscious sedation/ MAC.  Past Medical History:  Diagnosis Date  . Back ache   . Diverticulitis   . Hypertension     Past Surgical History:  Procedure Laterality Date  . back surgeries    . BACK SURGERY    . BOWEL RESECTION    . CHOLECYSTECTOMY  2017  . INGUINAL HERNIA REPAIR Left   . SHOULDER SURGERY    . XI ROBOTIC ASSISTED INGUINAL HERNIA REPAIR WITH MESH Right 10/08/2019   Procedure: XI ROBOTIC ASSISTED INGUINAL HERNIA REPAIR WITH MESH;  Surgeon: Jules Husbands, MD;  Location: [facility] ORS;  Service: General;  Laterality: Right;    Family History  Problem Relation Age of Onset  . Healthy Mother   . Healthy Father     Social History:  reports that he has been smoking. He has been smoking about 0.75 packs per day. He has never used smokeless tobacco. He reports previous drug use. He reports that he does not drink alcohol.  Allergies:  Allergies  Allergen Reactions  . Toradol [Ketorolac Tromethamine] Other (See Comments)    seizures  . Tramadol Other (See Comments)    Seizures     Medications reviewed.    ROS Full ROS performed and is otherwise negative other than what is stated in HPI   BP (!) 153/99   Pulse 88   Temp (!) 93.2 F (34 C) (Temporal)   Ht _0  (1.803 m)   Wt 245 lb 9.6 oz (111.4 kg)   SpO2 98%   BMI 34.25 kg/m   Physical Exam Vitals and nursing note reviewed. Exam  conducted with a chaperone present.  Constitutional:      General: He is not in acute distress.    Appearance: Normal appearance. He is normal weight.  Eyes:     General:        Right eye: No discharge.        Left eye: No discharge.  Cardiovascular:     Rate and Rhythm: Normal rate and regular rhythm.  Pulmonary:     Effort: No respiratory distress.     Breath sounds: No stridor. No wheezing.  Abdominal:     General: Abdomen is flat. There is no distension.     Palpations: There is no mass.     Tenderness: There is no guarding.     Hernia: No hernia is present.  Musculoskeletal:        General: Normal range of motion.  Skin:    General: Skin is warm and dry.     Capillary Refill: Capillary refill takes less than 2 seconds.     Coloration: Skin is not jaundiced.     Comments: 3 cm left posterior hip mass, mobile  Neurological:     General: No focal deficit present.     Mental Status: He is alert and  oriented to person, place, and time.  Psychiatric:        Mood and Affect: Mood normal.        Behavior: Behavior normal.        Thought Content: Thought content normal.        Judgment: Judgment normal.      Assessment/Plan:  Symptomatic procedure left hip soft tissue mass.  Patient wishes removal.  I initially patient wanted to do it here in the office but now is having second thoughts.  I did offer him sedation and to have it removed in the OR.  Patient is in agreement with that.  Procedure discussed with the patient in detail.  Risk, benefits and possible applications discussed with him in detail.  He understands and wished to proceed Greater than 50% of the 15 minutes  visit was spent in counseling/coordination of care   Caroleen Hamman, MD [place] [facility]

## 2019-11-22 NOTE — Patient Instructions (Signed)
One of our surgery scheduler will contact you within the next 24-48 hours. During that call, they will discuss the preparation prior to surgery and they will discuss the different dates and times for surgery. Please have the BLUE sheet available when they contact you. If you have any questions, please give our office a call.

## 2019-11-22 NOTE — Telephone Encounter (Signed)
Pt has been advised of Pre-Admission date/time, COVID Testing date and Surgery date.  Surgery Date: 11/30/19 Preadmission Testing Date: 11/24/19 (phone 8a-1p) Covid Testing Date: 11/26/19 - patient advised to go to the [facility] ([place] between 8a-1p   Patient has been made aware to call [phone], between 1-3:00pm the day before surgery, to find out what time to arrive for surgery.

## 2019-11-22 NOTE — H&P (View-Only) (Signed)
Outpatient Surgical Follow Up  11/22/2019  Mark Austin is an 39 y.o. male.   Chief Complaint  Patient presents with  . Follow-up     follow up excision of Nodule Left Buttock    HPI: 39 year old male well-known to me with a history of symptomatic left posterior hip subcutaneous nodule.  He reports some intermittent pain that is mild to moderate intensity and sharp in nature.  No specific alleviating or aggravating factors.  He wishes to have this excised.  Initially he was going to have this excised in the office but now he prefers to do it in the OR with at least some conscious sedation/ MAC.  Past Medical History:  Diagnosis Date  . Back ache   . Diverticulitis   . Hypertension     Past Surgical History:  Procedure Laterality Date  . back surgeries    . BACK SURGERY    . BOWEL RESECTION    . CHOLECYSTECTOMY  2017  . INGUINAL HERNIA REPAIR Left   . SHOULDER SURGERY    . XI ROBOTIC ASSISTED INGUINAL HERNIA REPAIR WITH MESH Right 10/08/2019   Procedure: XI ROBOTIC ASSISTED INGUINAL HERNIA REPAIR WITH MESH;  Surgeon: Jules Husbands, MD;  Location: [facility] ORS;  Service: General;  Laterality: Right;    Family History  Problem Relation Age of Onset  . Healthy Mother   . Healthy Father     Social History:  reports that he has been smoking. He has been smoking about 0.75 packs per day. He has never used smokeless tobacco. He reports previous drug use. He reports that he does not drink alcohol.  Allergies:  Allergies  Allergen Reactions  . Toradol [Ketorolac Tromethamine] Other (See Comments)    seizures  . Tramadol Other (See Comments)    Seizures     Medications reviewed.    ROS Full ROS performed and is otherwise negative other than what is stated in HPI   BP (!) 153/99   Pulse 88   Temp (!) 93.2 F (34 C) (Temporal)   Ht _0  (1.803 m)   Wt 245 lb 9.6 oz (111.4 kg)   SpO2 98%   BMI 34.25 kg/m   Physical Exam Vitals and nursing note reviewed. Exam  conducted with a chaperone present.  Constitutional:      General: He is not in acute distress.    Appearance: Normal appearance. He is normal weight.  Eyes:     General:        Right eye: No discharge.        Left eye: No discharge.  Cardiovascular:     Rate and Rhythm: Normal rate and regular rhythm.  Pulmonary:     Effort: No respiratory distress.     Breath sounds: No stridor. No wheezing.  Abdominal:     General: Abdomen is flat. There is no distension.     Palpations: There is no mass.     Tenderness: There is no guarding.     Hernia: No hernia is present.  Musculoskeletal:        General: Normal range of motion.  Skin:    General: Skin is warm and dry.     Capillary Refill: Capillary refill takes less than 2 seconds.     Coloration: Skin is not jaundiced.     Comments: 3 cm left posterior hip mass, mobile  Neurological:     General: No focal deficit present.     Mental Status: He is alert and  oriented to person, place, and time.  Psychiatric:        Mood and Affect: Mood normal.        Behavior: Behavior normal.        Thought Content: Thought content normal.        Judgment: Judgment normal.      Assessment/Plan:  Symptomatic procedure left hip soft tissue mass.  Patient wishes removal.  I initially patient wanted to do it here in the office but now is having second thoughts.  I did offer him sedation and to have it removed in the OR.  Patient is in agreement with that.  Procedure discussed with the patient in detail.  Risk, benefits and possible applications discussed with him in detail.  He understands and wished to proceed Greater than 50% of the 15 minutes  visit was spent in counseling/coordination of care   Caroleen Hamman, MD [facility]

## 2019-11-24 ENCOUNTER — Encounter
Admission: RE | Admit: 2019-11-24 | Discharge: 2019-11-24 | Disposition: A | Discharge status: Home / Self Care | Payer: Managed Care, Other (non HMO) | Source: Ambulatory Visit | Attending: Surgery | Admitting: Surgery

## 2019-11-24 ENCOUNTER — Other Ambulatory Visit: Payer: Self-pay

## 2019-11-24 HISTORY — DX: Unilateral inguinal hernia, without obstruction or gangrene, not specified as recurrent: K40.90

## 2019-11-24 HISTORY — DX: Personal history of urinary calculi: Z87.442

## 2019-11-24 HISTORY — DX: Gastro-esophageal reflux disease without esophagitis: K21.9

## 2019-11-24 NOTE — Pre-Procedure Instructions (Signed)
Copy and pasted from ED note 11/08/19:  ED ECG REPORT I, Vanessa Mead[place], the attending physician, personally viewed and interpreted this ECG.  EKG is sinus tachycardia rate of 109, no ST elevations, no T wave inversions, S1 every 3 T3

## 2019-11-24 NOTE — Patient Instructions (Addendum)
COVID TESTING Date: Friday, April 30 Testing site:  [facility] [address] Thru Hours:  [id number] am - 1:00 pm Once you are tested, you are asked to stay quarantined (avoiding public places) until after your surgery.   Your procedure is scheduled on:  Tuesday, May 4 Report to Day Surgery on the 2nd floor of the [facility]. To find out your arrival time, please call [phone] between 1PM - 3PM on: Monday, May 3  REMEMBER: Instructions that are not followed completely may result in serious medical risk, up to and including death; or upon the discretion of your surgeon and anesthesiologist your surgery may need to be rescheduled.  Do not eat food after midnight the night before surgery.  No gum chewing, lozengers or hard candies.  You may however, drink CLEAR liquids up to 2 hours before you are scheduled to arrive for your surgery. Do not drink anything within 2 hours of your scheduled arrival time.  Clear liquids include: - water  - apple juice without pulp - gatorade (not RED) - black coffee or tea (Do NOT add milk or creamers to the coffee or tea) Do NOT drink anything that is not on this list.  TAKE THESE MEDICATIONS THE MORNING OF SURGERY WITH A SIP OF WATER:  1.  Amlodipine 2.  Lansoprazole (Prevacid) - (take one the night before and one on the morning of surgery - helps to prevent nausea after surgery.) 3.  Propranolol 4.  Chantix  Stop Anti-inflammatories (NSAIDS) such as Advil, Aleve, Ibuprofen, Motrin, Naproxen, Naprosyn and Aspirin based products such as Excedrin, Goodys Powder, BC Powder. (May take Tylenol or Acetaminophen if needed.)  Stop ANY OVER THE COUNTER supplements until after surgery.  No Alcohol for 24 hours before or after surgery.  No Smoking including e-cigarettes for 24 hours prior to surgery.  No chewable tobacco products for at least 6 hours prior to surgery.  No nicotine patches on the day of surgery.  On the  morning of surgery brush your teeth with toothpaste and water, you may rinse your mouth with mouthwash if you wish. Do not swallow any toothpaste or mouthwash.  Do not wear jewelry.  Do not wear lotions, powders, or perfumes.   Do not shave 48 hours prior to surgery.   Do not bring valuables to the hospital. [facility] is not responsible for any missing/lost belongings or valuables.   Use CHG Soap as directed on instruction sheet.  Notify your doctor if there is any change in your medical condition (cold, fever, infection).  Wear comfortable clothing (specific to your surgery type) to the hospital.  Plan for stool softeners for home use; pain medications have a tendency to cause constipation. You can also help prevent constipation by eating foods high in fiber such as fruits and vegetables and drinking plenty of fluids as your diet allows.  After surgery, you can help prevent lung complications by doing breathing exercises.  Take deep breaths and cough every 1-2 hours. Your doctor may order a device called an Incentive Spirometer to help you take deep breaths. When coughing or sneezing, hold a pillow firmly against your incision with both hands. This is called "splinting." Doing this helps protect your incision. It also decreases belly discomfort.  If you are being discharged the day of surgery, you will not be allowed to drive home. You will need a responsible adult (18 years or older) to drive you home and stay with you that night.  If you are taking public transportation, you will need to have a responsible adult (18 years or older) with you. Please confirm with your physician that it is acceptable to use public transportation.   Please call the [facility] Dept[facility] at [phone] if you have any questions about these instructions.  Visitation Policy:  Patients undergoing a surgery or procedure may have one family member or support person with them as long as that  person is not COVID-19 positive or experiencing its symptoms.  That person may remain in the waiting area during the procedure.

## 2019-11-26 ENCOUNTER — Other Ambulatory Visit
Admission: RE | Admit: 2019-11-26 | Discharge: 2019-11-26 | Disposition: A | Discharge status: Home / Self Care | Payer: Managed Care, Other (non HMO) | Source: Ambulatory Visit | Attending: Surgery | Admitting: Surgery

## 2019-11-26 DIAGNOSIS — Z01812 Encounter for preprocedural laboratory examination: Secondary | ICD-10-CM | POA: Diagnosis present

## 2019-11-26 DIAGNOSIS — Z20822 Contact with and (suspected) exposure to covid-19: Secondary | ICD-10-CM | POA: Diagnosis not present

## 2019-11-26 LAB — SARS CORONAVIRUS 2 (TAT 6-24 HRS): SARS Coronavirus 2: NEGATIVE

## 2019-11-30 ENCOUNTER — Ambulatory Visit: Payer: Managed Care, Other (non HMO) | Admitting: Certified Registered"

## 2019-11-30 ENCOUNTER — Encounter: Payer: Self-pay | Admitting: Surgery

## 2019-11-30 ENCOUNTER — Encounter
Admission: RE | Disposition: A | Discharge status: Home / Self Care | Payer: Self-pay | Source: Home / Self Care | Attending: Surgery

## 2019-11-30 ENCOUNTER — Ambulatory Visit
Admission: RE | Admit: 2019-11-30 | Discharge: 2019-11-30 | Disposition: A | Discharge status: Home / Self Care | Payer: Managed Care, Other (non HMO) | Attending: Surgery | Admitting: Surgery

## 2019-11-30 DIAGNOSIS — Z885 Allergy status to narcotic agent status: Secondary | ICD-10-CM | POA: Insufficient documentation

## 2019-11-30 DIAGNOSIS — I10 Essential (primary) hypertension: Secondary | ICD-10-CM | POA: Diagnosis not present

## 2019-11-30 DIAGNOSIS — L720 Epidermal cyst: Secondary | ICD-10-CM | POA: Insufficient documentation

## 2019-11-30 DIAGNOSIS — Z888 Allergy status to other drugs, medicaments and biological substances status: Secondary | ICD-10-CM | POA: Diagnosis not present

## 2019-11-30 DIAGNOSIS — R222 Localized swelling, mass and lump, trunk: Secondary | ICD-10-CM

## 2019-11-30 DIAGNOSIS — F17210 Nicotine dependence, cigarettes, uncomplicated: Secondary | ICD-10-CM | POA: Diagnosis not present

## 2019-11-30 DIAGNOSIS — R2242 Localized swelling, mass and lump, left lower limb: Secondary | ICD-10-CM | POA: Diagnosis present

## 2019-11-30 HISTORY — PX: EXCISION MASS LOWER EXTREMETIES: SHX6705

## 2019-11-30 SURGERY — EXCISION MASS LOWER EXTREMITIES
Anesthesia: Choice | Laterality: Left

## 2019-11-30 MED ORDER — HYDROCODONE-ACETAMINOPHEN 5-325 MG PO TABS: 1.0000 | ORAL_TABLET | Freq: Four times a day (QID) | ORAL | 0 refills | Status: DC | PRN

## 2019-11-30 MED ORDER — PROPOFOL 500 MG/50ML IV EMUL
INTRAVENOUS | Status: AC
  Filled 2019-11-30: qty 50

## 2019-11-30 MED ORDER — MIDAZOLAM HCL 2 MG/2ML IJ SOLN
INTRAMUSCULAR | Status: AC
  Filled 2019-11-30: qty 2

## 2019-11-30 MED ORDER — ONDANSETRON HCL 4 MG/2ML IJ SOLN: 4.0000 mg | Freq: Once | INTRAMUSCULAR | Status: DC | PRN

## 2019-11-30 MED ORDER — ACETAMINOPHEN 500 MG PO TABS: 1000.0000 mg | ORAL_TABLET | ORAL | Status: AC

## 2019-11-30 MED ORDER — CHLORHEXIDINE GLUCONATE CLOTH 2 % EX PADS
6.0000 | MEDICATED_PAD | Freq: Once | CUTANEOUS | Status: AC
  Administered 2019-11-30: 6 via TOPICAL

## 2019-11-30 MED ORDER — ACETAMINOPHEN 500 MG PO TABS
ORAL_TABLET | ORAL | Status: AC
  Administered 2019-11-30: 1000 mg via ORAL
  Filled 2019-11-30: qty 2

## 2019-11-30 MED ORDER — FENTANYL CITRATE (PF) 100 MCG/2ML IJ SOLN
INTRAMUSCULAR | Status: DC | PRN
  Administered 2019-11-30: 100 ug via INTRAVENOUS

## 2019-11-30 MED ORDER — EPINEPHRINE PF 1 MG/ML IJ SOLN
INTRAMUSCULAR | Status: AC
  Filled 2019-11-30: qty 1

## 2019-11-30 MED ORDER — BUPIVACAINE HCL (PF) 0.25 % IJ SOLN
INTRAMUSCULAR | Status: AC
  Filled 2019-11-30: qty 30

## 2019-11-30 MED ORDER — EPHEDRINE 5 MG/ML INJ
INTRAVENOUS | Status: AC
  Filled 2019-11-30: qty 10

## 2019-11-30 MED ORDER — CHLORHEXIDINE GLUCONATE CLOTH 2 % EX PADS: 6.0000 | MEDICATED_PAD | Freq: Once | CUTANEOUS | Status: DC

## 2019-11-30 MED ORDER — GABAPENTIN 300 MG PO CAPS
ORAL_CAPSULE | ORAL | Status: AC
  Administered 2019-11-30: 300 mg via ORAL
  Filled 2019-11-30: qty 1

## 2019-11-30 MED ORDER — FENTANYL CITRATE (PF) 100 MCG/2ML IJ SOLN: 25.0000 ug | INTRAMUSCULAR | Status: DC | PRN

## 2019-11-30 MED ORDER — DEXMEDETOMIDINE HCL IN NACL 80 MCG/20ML IV SOLN
INTRAVENOUS | Status: AC
  Filled 2019-11-30: qty 20

## 2019-11-30 MED ORDER — GABAPENTIN 300 MG PO CAPS: 300.0000 mg | ORAL_CAPSULE | ORAL | Status: AC

## 2019-11-30 MED ORDER — BUPIVACAINE-EPINEPHRINE (PF) 0.25% -1:200000 IJ SOLN
INTRAMUSCULAR | Status: DC | PRN
  Administered 2019-11-30: 27 mL

## 2019-11-30 MED ORDER — PROPOFOL 10 MG/ML IV BOLUS
INTRAVENOUS | Status: AC
  Filled 2019-11-30: qty 20

## 2019-11-30 MED ORDER — PROPOFOL 500 MG/50ML IV EMUL
INTRAVENOUS | Status: DC | PRN
  Administered 2019-11-30: 125 ug/kg/min via INTRAVENOUS

## 2019-11-30 MED ORDER — LIDOCAINE HCL (PF) 2 % IJ SOLN
INTRAMUSCULAR | Status: AC
  Filled 2019-11-30: qty 5

## 2019-11-30 MED ORDER — BUPIVACAINE-EPINEPHRINE (PF) 0.25% -1:200000 IJ SOLN
INTRAMUSCULAR | Status: AC
  Filled 2019-11-30: qty 30

## 2019-11-30 MED ORDER — MIDAZOLAM HCL 2 MG/2ML IJ SOLN
INTRAMUSCULAR | Status: DC | PRN
  Administered 2019-11-30: 2 mg via INTRAVENOUS

## 2019-11-30 MED ORDER — EPHEDRINE SULFATE 50 MG/ML IJ SOLN
INTRAMUSCULAR | Status: DC | PRN
  Administered 2019-11-30 (×2): 5 mg via INTRAVENOUS

## 2019-11-30 MED ORDER — FENTANYL CITRATE (PF) 100 MCG/2ML IJ SOLN
INTRAMUSCULAR | Status: AC
  Filled 2019-11-30: qty 2

## 2019-11-30 MED ORDER — HYDROMORPHONE HCL 1 MG/ML IJ SOLN
INTRAMUSCULAR | Status: AC
  Administered 2019-11-30: 0.25 mg via INTRAVENOUS
  Filled 2019-11-30: qty 1

## 2019-11-30 MED ORDER — LACTATED RINGERS IV SOLN: INTRAVENOUS | Status: DC

## 2019-11-30 MED ORDER — HYDROMORPHONE HCL 1 MG/ML IJ SOLN
0.2500 mg | INTRAMUSCULAR | Status: DC | PRN
  Administered 2019-11-30 (×3): 0.25 mg via INTRAVENOUS

## 2019-11-30 SURGICAL SUPPLY — 27 items
BLADE SURG 15 STRL LF DISP TIS (BLADE) ×1 IMPLANT
BLADE SURG 15 STRL SS (BLADE) ×1
CANISTER SUCT 1200ML W/VALVE (MISCELLANEOUS) ×2 IMPLANT
CHLORAPREP W/TINT 26 (MISCELLANEOUS) ×2 IMPLANT
COVER WAND RF STERILE (DRAPES) ×2 IMPLANT
DERMABOND ADVANCED (GAUZE/BANDAGES/DRESSINGS) ×1
DERMABOND ADVANCED .7 DNX12 (GAUZE/BANDAGES/DRESSINGS) ×1 IMPLANT
DRAPE LAPAROTOMY 100X77 ABD (DRAPES) ×2 IMPLANT
ELECT REM PT RETURN 9FT ADLT (ELECTROSURGICAL) ×2
ELECTRODE REM PT RTRN 9FT ADLT (ELECTROSURGICAL) ×1 IMPLANT
GLOVE BIO SURGEON STRL SZ7 (GLOVE) ×2 IMPLANT
GOWN STRL REUS W/ TWL LRG LVL3 (GOWN DISPOSABLE) ×2 IMPLANT
GOWN STRL REUS W/TWL LRG LVL3 (GOWN DISPOSABLE) ×2
KIT TURNOVER KIT A (KITS) ×2 IMPLANT
LABEL OR SOLS (LABEL) ×2 IMPLANT
NEEDLE HYPO 22GX1.5 SAFETY (NEEDLE) ×2 IMPLANT
NS IRRIG 500ML POUR BTL (IV SOLUTION) ×2 IMPLANT
PACK BASIN MINOR (MISCELLANEOUS) ×2 IMPLANT
SPONGE LAP 18X18 RF (DISPOSABLE) ×2 IMPLANT
SUT MNCRL 4-0 (SUTURE) ×1
SUT MNCRL 4-0 27XMFL (SUTURE) ×1
SUT VIC AB 0 CT1 36 (SUTURE) ×1 IMPLANT
SUT VIC AB 2-0 CT2 27 (SUTURE) ×1 IMPLANT
SUT VIC AB 3-0 SH 27 (SUTURE) ×1
SUT VIC AB 3-0 SH 27X BRD (SUTURE) IMPLANT
SUTURE MNCRL 4-0 27XMF (SUTURE) ×1 IMPLANT
SYR 10ML LL (SYRINGE) ×2 IMPLANT

## 2019-11-30 NOTE — Transfer of Care (Signed)
Immediate Anesthesia Transfer of Care Note  Patient: Mark Austin  Procedure(s) Performed: EXCISION MASS LOWER EXTREMETIES, Left Hip (Left )  Patient Location: PACU  Anesthesia Type:General  Level of Consciousness: awake  Airway & Oxygen Therapy: Patient connected to face mask  Post-op Assessment: Report given to RN  Post vital signs: Reviewed  Last Vitals:  Vitals Value Taken Time  BP 113/64 11/30/19 1402  Temp 35.9 C 11/30/19 1402  Pulse 72 11/30/19 1404  Resp 13 11/30/19 1404  SpO2 98 % 11/30/19 1404  Vitals shown include unvalidated device data.  Last Pain:  Vitals:   11/30/19 1233  TempSrc: Tympanic  PainSc: 6          Complications: No apparent anesthesia complications

## 2019-11-30 NOTE — Anesthesia Preprocedure Evaluation (Signed)
Anesthesia Evaluation  Patient identified by MRN, date of birth, ID band Patient awake    Reviewed: Allergy & Precautions, H&P , NPO status , Patient's Chart, lab work & pertinent test results  Airway Mallampati: II  TM Distance: >3 FB Neck ROM: full    Dental  (+) Teeth Intact   Pulmonary sleep apnea (lost 40 lbs, does not think it's an issue anymore, does not use CPAP) , Current Smoker and Patient abstained from smoking., former smoker,           Cardiovascular hypertension, (-) angina(-) Past MI and (-) Cardiac Stents (-) dysrhythmias      Neuro/Psych negative neurological ROS  negative psych ROS   GI/Hepatic Neg liver ROS, GERD  ,  Endo/Other  negative endocrine ROS  Renal/GU      Musculoskeletal   Abdominal   Peds  Hematology negative hematology ROS (+)   Anesthesia Other Findings Past Medical History: No date: Back ache No date: Diverticulitis No date: Hypertension  Past Surgical History: No date: back surgeries No date: BACK SURGERY No date: BOWEL RESECTION 2017: CHOLECYSTECTOMY No date: INGUINAL HERNIA REPAIR; Left No date: SHOULDER SURGERY     Reproductive/Obstetrics negative OB ROS                             Anesthesia Physical  Anesthesia Plan  ASA: II  Anesthesia Plan:    Post-op Pain Management:    Induction: Intravenous  PONV Risk Score and Plan: Propofol infusion and TIVA  Airway Management Planned: Nasal Cannula  Additional Equipment:   Intra-op Plan:   Post-operative Plan:   Informed Consent: I have reviewed the patients History and Physical, chart, labs and discussed the procedure including the risks, benefits and alternatives for the proposed anesthesia with the patient or authorized representative who has indicated his/her understanding and acceptance.     Dental Advisory Given and Dental advisory given  Plan Discussed with: CRNA and  Surgeon  Anesthesia Plan Comments:         Anesthesia Quick Evaluation

## 2019-11-30 NOTE — Interval H&P Note (Signed)
History and Physical Interval Note:  11/30/2019 12:43 PM  Mark Austin  has presented today for surgery, with the diagnosis of Left hip mass.  The various methods of treatment have been discussed with the patient and family. After consideration of risks, benefits and other options for treatment, the patient has consented to  Procedure(s): EXCISION MASS LOWER EXTREMETIES, Left Hip (Left) as a surgical intervention.  The patient's history has been reviewed, patient examined, no change in status, stable for surgery.  I have reviewed the patient's chart and labs.  Questions were answered to the patient's satisfaction.     [place]

## 2019-11-30 NOTE — Op Note (Signed)
  11/30/2019  2:06 PM  PATIENT:  Mark Austin  39 y.o. male  PRE-OPERATIVE DIAGNOSIS:  Left hip posterior mass  POST-OPERATIVE DIAGNOSIS:  Same  PROCEDURE:   Excision of posterior left hip mass measuring 3-1/2 cm Intermediate closure of a 3-1/2 cm wound left posterior hip  SURGEON:  Surgeon(s) and Role:    * Zachari [place] F, MD - Primary   ANESTHESIA: propofol Iv and local    DICTATION:  Patient was explained about the  Procedure in detail. Risks, benefits, possible complications and a consent was obtained. The patient was  taken to the operating room and placed in the lateral decubitus with Left side up.  Marcaine quarter percent with epinephrine was injected. Incision was created with a 15 blade knife.  We actually were unable to remove the cyst without getting into his wall given that it was chronic currently inflamed and very attached to the skin anteriorly.  I was able to scoop out the contents of the cyst and using Metzenbaum scissors were able to dissect the cyst from the adjacent subcu tissue.  The specimen was sent for permanent W.  The wound was irrigated and closed in a 2 layer fashion with 3-0 Vicryl for the dermis and 4 Monocryl for the skin in a subcuticular fashion.  Dermabond was applied.  Needle and laparotomy counts were correct and there were no immediate complications  Jules Husbands, MD

## 2019-11-30 NOTE — Anesthesia Postprocedure Evaluation (Signed)
Anesthesia Post Note  Patient: Mark Austin  Procedure(s) Performed: EXCISION MASS LOWER EXTREMETIES, Left Hip (Left )  Patient location during evaluation: PACU Anesthesia Type: General Level of consciousness: awake and alert and oriented Pain management: pain level controlled Vital Signs Assessment: post-procedure vital signs reviewed and stable Respiratory status: spontaneous breathing Cardiovascular status: blood pressure returned to baseline Anesthetic complications: no     Last Vitals:  Vitals:   11/30/19 1233 11/30/19 1402  BP: (!) 140/100 113/64  Pulse: 82 75  Resp: 18 20  Temp: (!) 36.3 C (!) 35.9 C  SpO2: 96% 100%    Last Pain:  Vitals:   11/30/19 1402  TempSrc:   PainSc: Asleep                 Takiera Mayo

## 2019-11-30 NOTE — Interval H&P Note (Signed)
History and Physical Interval Note:  11/30/2019 12:43 PM  Mark Austin  has presented today for surgery, with the diagnosis of Left hip mass.  The various methods of treatment have been discussed with the patient and family. After consideration of risks, benefits and other options for treatment, the patient has consented to  Procedure(s): EXCISION MASS LOWER EXTREMETIES, Left Hip (Left) as a surgical intervention.  The patient's history has been reviewed, patient examined, no change in status, stable for surgery.  I have reviewed the patient's chart and labs.  Questions were answered to the patient's satisfaction.     [place]

## 2019-11-30 NOTE — Discharge Instructions (Signed)

## 2019-12-02 LAB — SURGICAL PATHOLOGY

## 2019-12-13 ENCOUNTER — Telehealth (INDEPENDENT_AMBULATORY_CARE_PROVIDER_SITE_OTHER): Payer: Self-pay | Admitting: Surgery

## 2019-12-13 ENCOUNTER — Other Ambulatory Visit: Payer: Self-pay

## 2019-12-13 DIAGNOSIS — Z09 Encounter for follow-up examination after completed treatment for conditions other than malignant neoplasm: Secondary | ICD-10-CM

## 2019-12-13 NOTE — Progress Notes (Signed)
Called pt on his cellphone. S/p excision EIC. Path d/w the pt in etail. Denies any pain, fevers.  Some scant draiange that has improved. Advise to call back and make appt if condition worsens RTC prn

## 2020-02-18 ENCOUNTER — Encounter: Payer: Self-pay | Admitting: Emergency Medicine

## 2020-02-18 ENCOUNTER — Other Ambulatory Visit: Payer: Self-pay

## 2020-02-18 ENCOUNTER — Emergency Department: Payer: Managed Care, Other (non HMO)

## 2020-02-18 ENCOUNTER — Emergency Department
Admission: EM | Admit: 2020-02-18 | Discharge: 2020-02-18 | Disposition: A | Discharge status: Home / Self Care | Payer: Managed Care, Other (non HMO) | Attending: Emergency Medicine | Admitting: Emergency Medicine

## 2020-02-18 DIAGNOSIS — K219 Gastro-esophageal reflux disease without esophagitis: Secondary | ICD-10-CM | POA: Diagnosis not present

## 2020-02-18 DIAGNOSIS — R1032 Left lower quadrant pain: Secondary | ICD-10-CM | POA: Insufficient documentation

## 2020-02-18 DIAGNOSIS — I10 Essential (primary) hypertension: Secondary | ICD-10-CM | POA: Insufficient documentation

## 2020-02-18 DIAGNOSIS — Z79899 Other long term (current) drug therapy: Secondary | ICD-10-CM | POA: Insufficient documentation

## 2020-02-18 DIAGNOSIS — Z87891 Personal history of nicotine dependence: Secondary | ICD-10-CM | POA: Insufficient documentation

## 2020-02-18 LAB — COMPREHENSIVE METABOLIC PANEL
ALT: 25 U/L (ref 0–44)
AST: 25 U/L (ref 15–41)
Albumin: 4.7 g/dL (ref 3.5–5.0)
Alkaline Phosphatase: 82 U/L (ref 38–126)
Anion gap: 10 (ref 5–15)
BUN: 16 mg/dL (ref 6–20)
CO2: 27 mmol/L (ref 22–32)
Calcium: 9.5 mg/dL (ref 8.9–10.3)
Chloride: 96 mmol/L — ABNORMAL LOW (ref 98–111)
Creatinine, Ser: 1.17 mg/dL (ref 0.61–1.24)
GFR calc Af Amer: >60 mL/min (ref >60)
GFR calc non Af Amer: >60 mL/min (ref >60)
Glucose, Bld: 101 mg/dL — ABNORMAL HIGH (ref 70–99)
Potassium: 4.4 mmol/L (ref 3.5–5.1)
Sodium: 133 mmol/L — ABNORMAL LOW (ref 135–145)
Total Bilirubin: 1.5 mg/dL — ABNORMAL HIGH (ref 0.3–1.2)
Total Protein: 8.6 g/dL — ABNORMAL HIGH (ref 6.5–8.1)

## 2020-02-18 LAB — URINALYSIS, COMPLETE (UACMP) WITH MICROSCOPIC
Bacteria, UA: NONE SEEN
Bilirubin Urine: NEGATIVE
Glucose, UA: NEGATIVE mg/dL
Hgb urine dipstick: NEGATIVE
Ketones, ur: NEGATIVE mg/dL
Leukocytes,Ua: NEGATIVE
Nitrite: NEGATIVE
Protein, ur: NEGATIVE mg/dL
Specific Gravity, Urine: 1.024 (ref 1.005–1.030)
Squamous Epithelial / HPF: NONE SEEN (ref 0–5)
pH: 5 (ref 5.0–8.0)

## 2020-02-18 LAB — CBC
HCT: 54.4 % — ABNORMAL HIGH (ref 39.0–52.0)
Hemoglobin: 18.7 g/dL — ABNORMAL HIGH (ref 13.0–17.0)
MCH: 30.2 pg (ref 26.0–34.0)
MCHC: 34.4 g/dL (ref 30.0–36.0)
MCV: 87.9 fL (ref 80.0–100.0)
Platelets: 214 10*3/uL (ref 150–400)
RBC: 6.19 MIL/uL — ABNORMAL HIGH (ref 4.22–5.81)
RDW: 12.7 % (ref 11.5–15.5)
WBC: 9.1 10*3/uL (ref 4.0–10.5)
nRBC: 0 % (ref 0.0–0.2)

## 2020-02-18 LAB — LIPASE, BLOOD: Lipase: 28 U/L (ref 11–51)

## 2020-02-18 IMAGING — CT CT ABD-PELV W/ CM
2 of 4 series · 16 of 46 positions shown, 18 images · IV contrast (APPLIED)
Comparison: [DATE]

CLINICAL DATA: Left lower quadrant pain for several days

EXAM:
CT ABDOMEN AND PELVIS WITH CONTRAST
TECHNIQUE: Multidetector CT imaging of the abdomen and pelvis was performed
using the standard protocol following bolus administration of
intravenous contrast.
CONTRAST:  100mL OMNIPAQUE IOHEXOL 300 MG/ML  SOLN

[Series 2: routine abd/pel with · axial · 0.90mm/px · z∈[-1000,-560]mm · 13 of 98 slices shown, 15 images]
[im 5/98  soft-tissue]
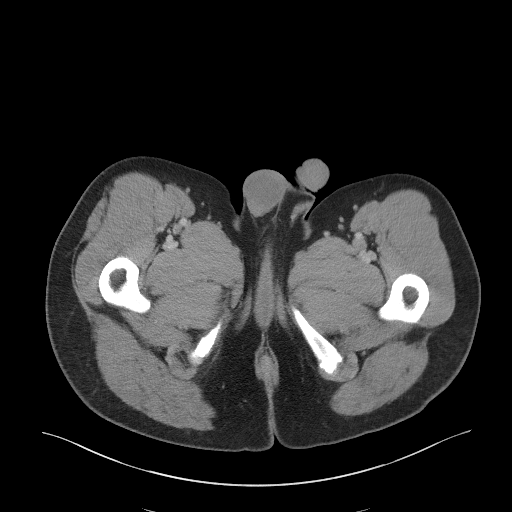
[im 5/98  bone]
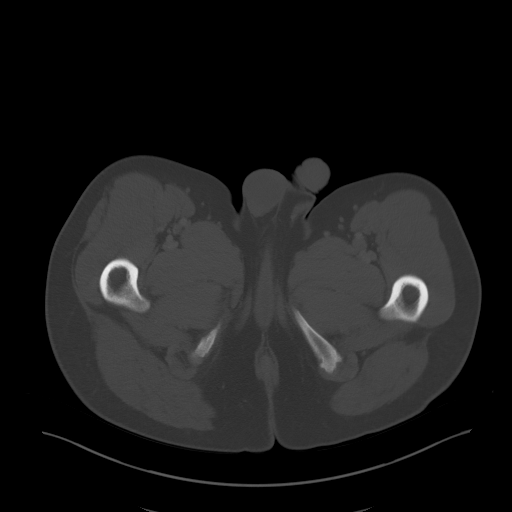
[im 13/98  soft-tissue]
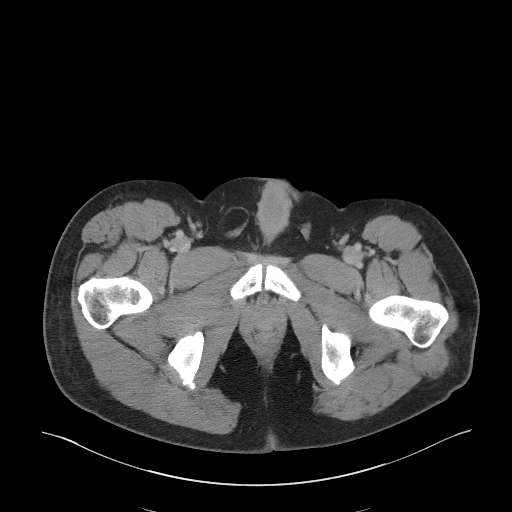
[im 21/98  soft-tissue]
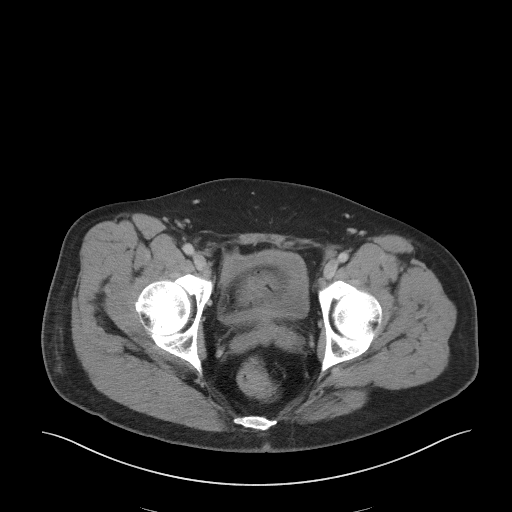
[im 29/98  soft-tissue]
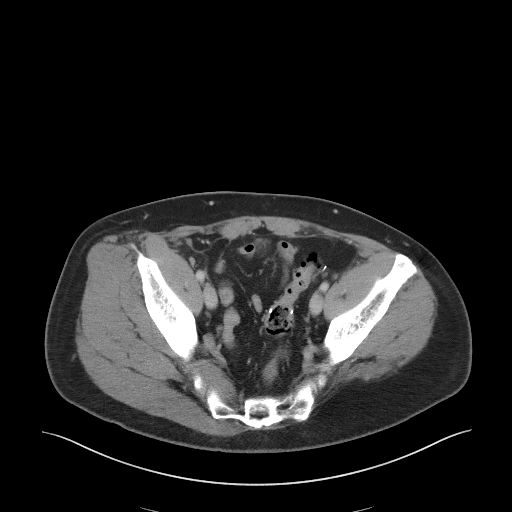
[im 33/98  soft-tissue]
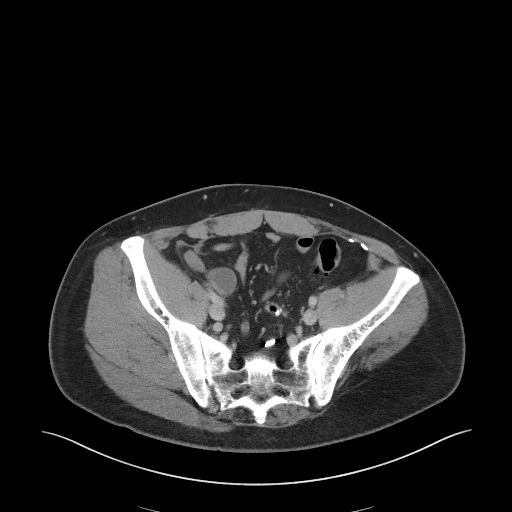
[im 41/98  soft-tissue]
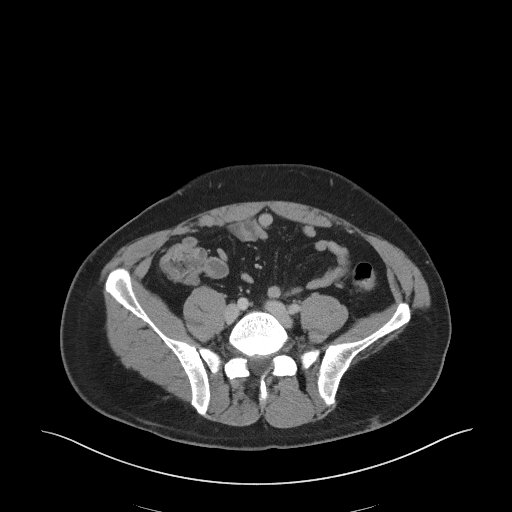
[im 49/98  soft-tissue]
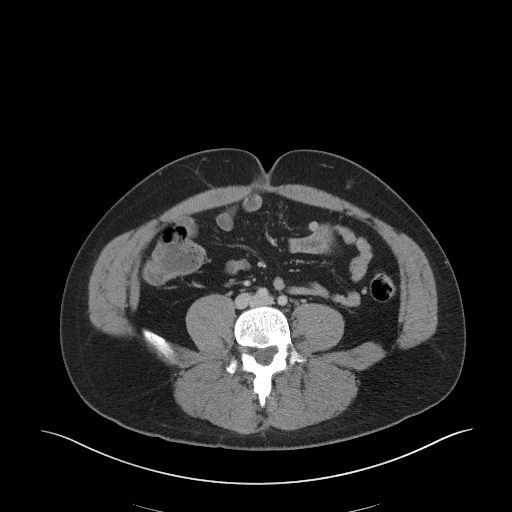
[im 57/98  soft-tissue]
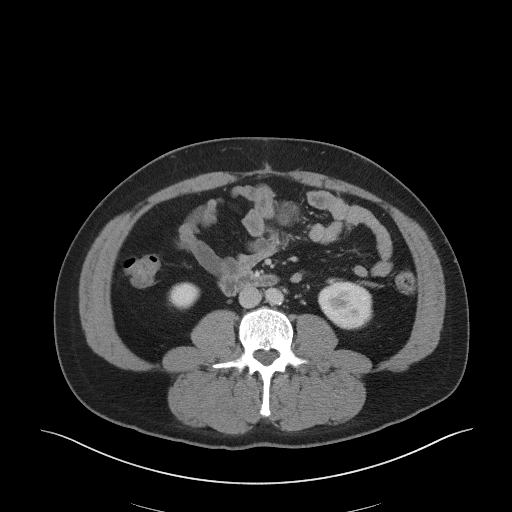
[im 65/98  soft-tissue]
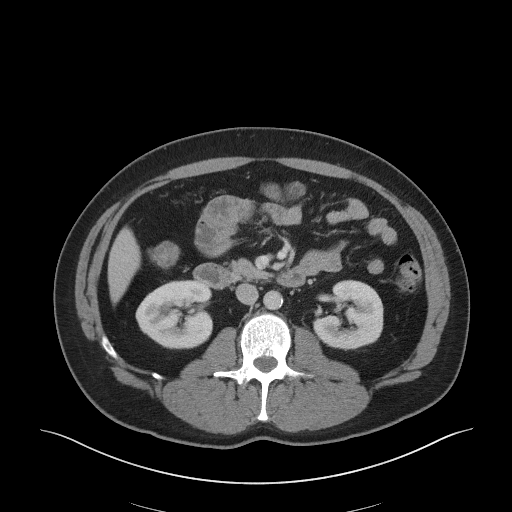
[im 65/98  bone]
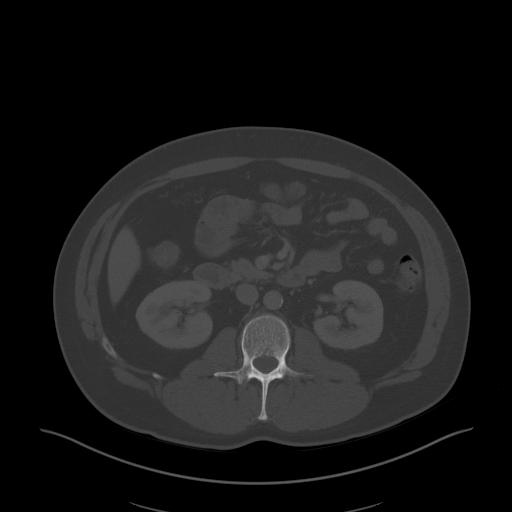
[im 69/98  soft-tissue]
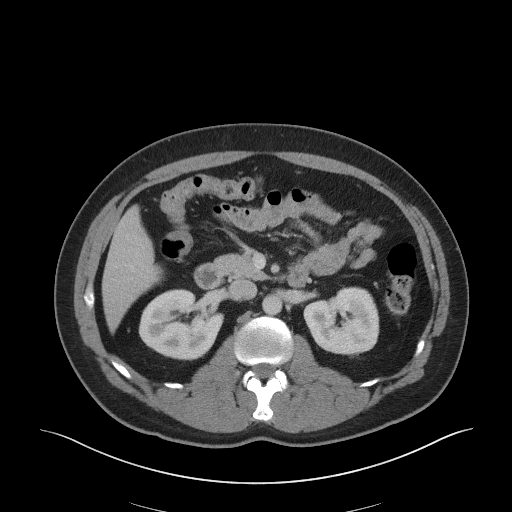
[im 77/98  soft-tissue]
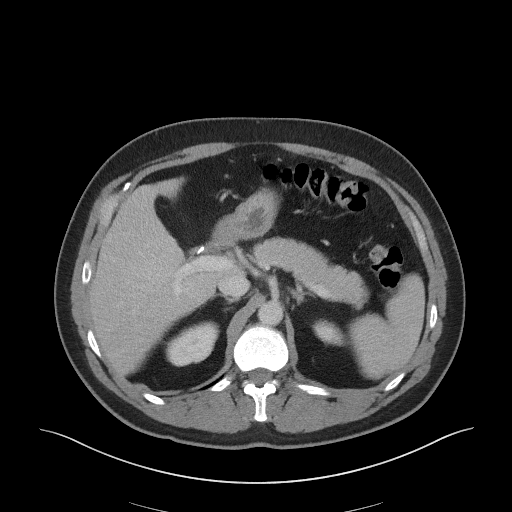
[im 85/98  soft-tissue]
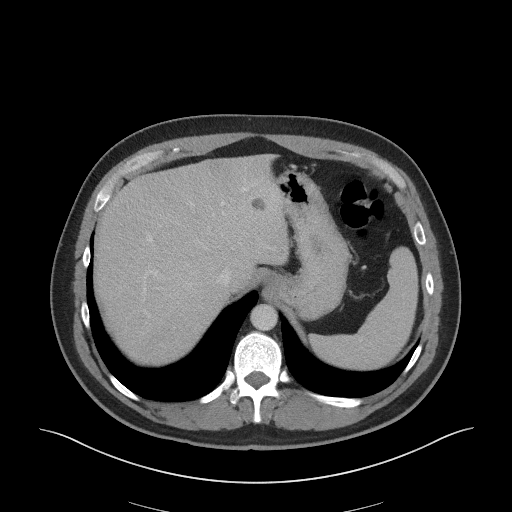
[im 93/98  soft-tissue]
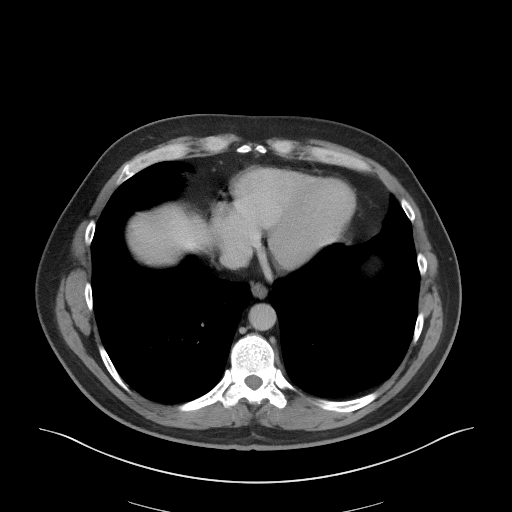

[Series 5: coronal st · coronal · 0.81mm/px · 3 of 103 slices shown]
[im 35/103  soft-tissue]
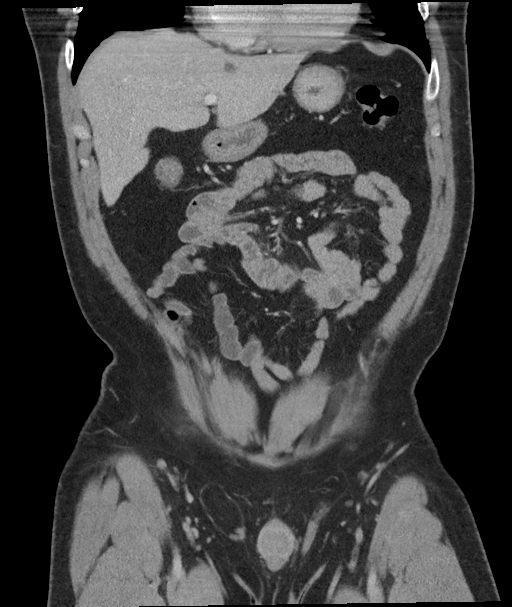
[im 46/103  soft-tissue]
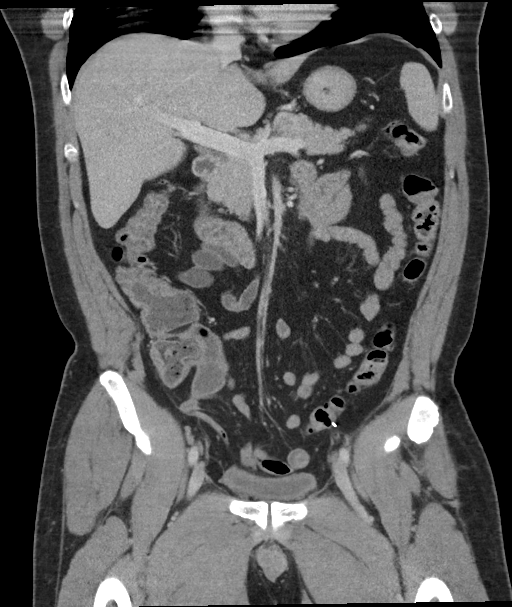
[im 57/103  soft-tissue]
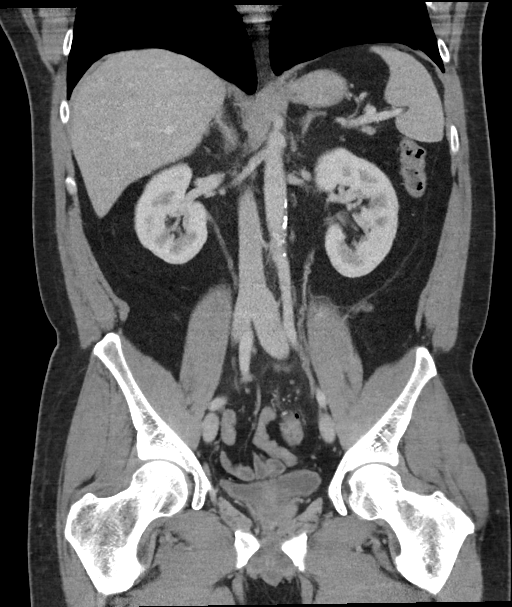

[16 of 46 positions shown; findings below may reference images not displayed]

FINDINGS: Lower chest: No acute abnormality.

Hepatobiliary: Gallbladder has been surgically removed. Scattered
cysts are noted throughout the left lobe of the liver.

Pancreas: Unremarkable. No pancreatic ductal dilatation or
surrounding inflammatory changes.

Spleen: Normal in size without focal abnormality.

Adrenals/Urinary Tract: Adrenal glands are within normal limits.
Kidneys demonstrate normal enhancement pattern. No renal calculi or
urinary tract obstructive changes are seen. Ureters and bladder are
within normal limits.

Stomach/Bowel: The appendix is within normal limits. Diverticular
change of the colon is noted. Changes of prior sigmoid colectomy are
seen with patent reanastomosis. No small bowel abnormality is noted.
Stomach is within normal limits.

Vascular/Lymphatic: Aortic atherosclerosis. No enlarged abdominal or
pelvic lymph nodes.

Reproductive: Prostate is unremarkable.

Other: No abdominal wall hernia or abnormality. No abdominopelvic
ascites. Surgical clips are noted in the retroperitoneum on the
left. Postsurgical changes are noted in the inguinal regions
bilaterally consistent with the given clinical history of hernia
repair. There remains some fat containing herniation on the right
similar to that seen on the prior exam.

Musculoskeletal: Postsurgical changes are noted in the lower lumbar
spine. No acute bony abnormality is noted.
IMPRESSION: Diverticular change without evidence of diverticulitis.

Postsurgical change. Persistent fat containing right inguinal hernia
is noted.

No acute abnormality is noted.

## 2020-02-18 MED ORDER — ONDANSETRON 8 MG PO TBDP: 8.0000 mg | ORAL_TABLET | Freq: Three times a day (TID) | ORAL | 0 refills | Status: DC | PRN

## 2020-02-18 MED ORDER — DICYCLOMINE HCL 10 MG PO CAPS: 10.0000 mg | ORAL_CAPSULE | Freq: Three times a day (TID) | ORAL | 0 refills | Status: DC | PRN

## 2020-02-18 MED ORDER — SODIUM CHLORIDE 0.9 % IV BOLUS
1000.0000 mL | Freq: Once | INTRAVENOUS | Status: AC
  Administered 2020-02-18: 1000 mL via INTRAVENOUS

## 2020-02-18 MED ORDER — ONDANSETRON HCL 4 MG/2ML IJ SOLN
4.0000 mg | Freq: Once | INTRAMUSCULAR | Status: AC
  Administered 2020-02-18: 4 mg via INTRAVENOUS
  Filled 2020-02-18: qty 2

## 2020-02-18 MED ORDER — IOHEXOL 300 MG/ML  SOLN
100.0000 mL | Freq: Once | INTRAMUSCULAR | Status: AC | PRN
  Administered 2020-02-18: 100 mL via INTRAVENOUS

## 2020-02-18 MED ORDER — FENTANYL CITRATE (PF) 100 MCG/2ML IJ SOLN
50.0000 ug | INTRAMUSCULAR | Status: DC | PRN
  Administered 2020-02-18: 50 ug via INTRAVENOUS
  Filled 2020-02-18: qty 2

## 2020-02-18 MED ORDER — DICYCLOMINE HCL 10 MG/ML IM SOLN
20.0000 mg | Freq: Once | INTRAMUSCULAR | Status: AC
  Administered 2020-02-18: 20 mg via INTRAMUSCULAR
  Filled 2020-02-18: qty 2

## 2020-02-18 MED ORDER — MORPHINE SULFATE (PF) 4 MG/ML IV SOLN
4.0000 mg | Freq: Once | INTRAVENOUS | Status: AC
  Administered 2020-02-18: 4 mg via INTRAVENOUS
  Filled 2020-02-18: qty 1

## 2020-02-18 NOTE — Discharge Instructions (Signed)
Take your usual pain medication as needed.  In addition you may take the Bentyl prescribed today for crampy type abdominal pain, and the Zofran for nausea.  Return to the ER for new, worsening, or persistent severe abdominal pain, fever, weakness, vomiting, inability to hold anything down by mouth, or any other new or worsening symptoms that concern you.

## 2020-02-18 NOTE — ED Provider Notes (Signed)
[facility] Emergency Department Provider Note ____________________________________________   First MD Initiated Contact with Patient 02/18/20 1957     (approximate)  I have reviewed the triage vital signs and the nursing notes.   HISTORY  Chief Complaint Abdominal Pain    HPI Mark Austin is a 39 y.o. male with PMH as noted below who presents with left lower quadrant abdominal pain over the last 2 days, gradual onset, worsening course, nonradiating.  He states that it is present constantly, but then has a crampy aspect in which it intermittently becomes worse.  He reports nausea and multiple episodes of vomiting yesterday as well as numerous episodes of watery diarrhea, both without blood.  These seem to have subsided today.  He states that the pain is different than when he had diverticulitis in the past.  Past Medical History:  Diagnosis Date  . Back ache   . Diverticulitis   . GERD (gastroesophageal reflux disease)   . History of kidney stones   . Hypertension   . Inguinal hernia     There are no problems to display for this patient.   Past Surgical History:  Procedure Laterality Date  . back surgeries     x5 lumbar L4L5  . BACK SURGERY    . BOWEL RESECTION  2016  . CHOLECYSTECTOMY  2017  . COLON SURGERY    . COLONOSCOPY    . EXCISION MASS LOWER EXTREMETIES Left 11/30/2019   Procedure: EXCISION MASS LOWER EXTREMETIES, Left Hip;  Surgeon: Jules Husbands, MD;  Location: [facility] ORS;  Service: General;  Laterality: Left;  . HERNIA REPAIR    . INGUINAL HERNIA REPAIR Left   . SHOULDER SURGERY Right    rotator cuff tear  . XI ROBOTIC ASSISTED INGUINAL HERNIA REPAIR WITH MESH Right 10/08/2019   Procedure: XI ROBOTIC ASSISTED INGUINAL HERNIA REPAIR WITH MESH;  Surgeon: Jules Husbands, MD;  Location: [facility] ORS;  Service: General;  Laterality: Right;    Prior to Admission medications   Medication Sig Start Date End Date Taking? Authorizing Provider   amLODipine (NORVASC) 10 MG tablet Take 10 mg by mouth daily.  09/16/19   [provider]  atorvastatin (LIPITOR) 10 MG tablet Take 10 mg by mouth at bedtime.  11/09/19   [provider]  chlorthalidone (HYGROTON) 25 MG tablet Take 25 mg by mouth daily.  11/08/19   [provider]  dicyclomine (BENTYL) 10 MG capsule Take 1 capsule (10 mg total) by mouth 3 (three) times daily as needed for up to 3 days (abdominal cramping). 02/18/20 02/21/20  Arta Silence, MD  HYDROcodone-acetaminophen (NORCO/VICODIN) 5-325 MG tablet Take 1 tablet by mouth every 6 (six) hours as needed for moderate pain. 11/30/19   Pabon, Diego F, MD  lansoprazole (PREVACID) 30 MG capsule Take 30 mg by mouth as needed.  10/20/19 11/24/19  [provider]  lisinopril (ZESTRIL) 40 MG tablet Take 40 mg by mouth daily.  07/07/19   [provider]  morphine (MSIR) 15 MG tablet Take 15 mg by mouth in the morning and at bedtime.    [provider]  ondansetron (ZOFRAN ODT) 8 MG disintegrating tablet Take 1 tablet (8 mg total) by mouth every 8 (eight) hours as needed for nausea or vomiting. 02/18/20   Arta Silence, MD  prazosin (MINIPRESS) 1 MG capsule Take 3 mg by mouth at bedtime.     [provider]  propranolol ER (INDERAL LA) 160 MG SR capsule Take 160  mg by mouth daily.    [provider]  tapentadol (NUCYNTA ER) 100 MG 12 hr tablet Take 100 mg by mouth in the morning and at bedtime.  07/06/10   [provider]  testosterone cypionate (DEPOTESTOTERONE CYPIONATE) 100 MG/ML injection Inject 80 mg into the muscle every 7 (seven) days. For IM use only  Thursday's    [provider]  varenicline (CHANTIX PAK) 0.5 MG X 11 & 1 MG X 42 tablet Take 0.5 mg by mouth 2 (two) times daily.     [provider]    Allergies Toradol [ketorolac tromethamine] and Tramadol  Family History  Problem Relation Age of Onset  . Healthy Mother   . Healthy  Father     Social History Social History   Tobacco Use  . Smoking status: Former Smoker    Packs/day: 0.75    Types: Cigarettes    Quit date: 11/23/2019    Years since quitting: 0.2  . Smokeless tobacco: Never Used  Vaping Use  . Vaping Use: Never used  Substance Use Topics  . Alcohol use: Never  . Drug use: Not Currently    Review of Systems  Constitutional: No fever/chills.  Positive for fatigue. Eyes: No redness. ENT: No sore throat. Cardiovascular: Denies chest pain. Respiratory: Denies shortness of breath. Gastrointestinal: Positive for vomiting and diarrhea. Genitourinary: Negative for dysuria or testicular pain.  Musculoskeletal: Negative for back pain. Skin: Negative for rash. Neurological: Negative for headache.   ____________________________________________   PHYSICAL EXAM:  VITAL SIGNS: ED Triage Vitals  Enc Vitals Group     BP 02/18/20 1202 (!) 160/120     Pulse Rate 02/18/20 1200 96     Resp 02/18/20 1200 20     Temp 02/18/20 1200 98.3 F (36.8 C)     Temp Source 02/18/20 1200 Oral     SpO2 02/18/20 1200 97 %     Weight 02/18/20 1201 (!) 225 lb (102.1 kg)     Height 02/18/20 1201 5\' 11"  (1.803 m)     Head Circumference --      Peak Flow --      Pain Score 02/18/20 1201 8     Pain Loc --      Pain Edu? --      Excl. in [place]? --     Constitutional: Alert and oriented. Well appearing and in no acute distress. Eyes: Conjunctivae are normal.  Head: Atraumatic. Nose: No congestion/rhinnorhea. Mouth/Throat: Mucous membranes are moist.   Neck: Normal range of motion.  Cardiovascular: Normal rate, regular rhythm.  Good peripheral circulation. Respiratory: Normal respiratory effort.  No retractions. Gastrointestinal: Soft with mild left lower quadrant tenderness.  No inguinal tenderness.  No peritoneal signs.  No distention.  Genitourinary: No flank tenderness. Musculoskeletal:  Extremities warm and well perfused.  Neurologic:  Normal speech and  language. No gross focal neurologic deficits are appreciated.  Skin:  Skin is warm and dry. No rash noted. Psychiatric: Mood and affect are normal. Speech and behavior are normal.  ____________________________________________   LABS (all labs ordered are listed, but only abnormal results are displayed)  Labs Reviewed  COMPREHENSIVE METABOLIC PANEL - Abnormal; Notable for the following components:      Result Value   Sodium 133 (*)    Chloride 96 (*)    Glucose, Bld 101 (*)    Total Protein 8.6 (*)    Total Bilirubin 1.5 (*)    All other components within normal limits  CBC -  Abnormal; Notable for the following components:   RBC 6.19 (*)    Hemoglobin 18.7 (*)    HCT 54.4 (*)    All other components within normal limits  URINALYSIS, COMPLETE (UACMP) WITH MICROSCOPIC - Abnormal; Notable for the following components:   Color, Urine YELLOW (*)    APPearance CLEAR (*)    All other components within normal limits  LIPASE, BLOOD   ____________________________________________  EKG   ____________________________________________  RADIOLOGY  CT abdomen: No acute abnormality.  Diverticulosis with no evidence of diverticulitis.  Right inguinal fat-containing hernia, unchanged from prior.  ____________________________________________   PROCEDURES  Procedure(s) performed: No  Procedures  Critical Care performed: No ____________________________________________   INITIAL IMPRESSION / ASSESSMENT AND PLAN / ED COURSE  Pertinent labs & imaging results that were available during my care of the patient were reviewed by me and considered in my medical decision making (see chart for details).  39 year old male with PMH as noted above presents with left lower quadrant abdominal pain for 2 days associated with nausea, vomiting, diarrhea with multiple episodes yesterday.  I reviewed the past medical records in [place].  The patient has had a right inguinal hernia repair.  He has also  had abdominal wall soft tissue mass removed earlier this year.  On exam, he is overall well-appearing.  His vital signs are normal except for hypertension.  He has some tenderness in the left lower quadrant but no peritoneal signs.  The patient waited 8 hours prior to being seen, and during this time lab work-up and CT abdomen were completed.  Lab work-up is unremarkable for the patient, with normal WBC count.  His urinalysis is also negative.  CT shows no acute abnormalities.  Overall given the presence of significant vomiting and diarrhea, presentation is most consistent with gastroenteritis or foodborne illness, versus possible mild colitis not evident on the CT.  There is no evidence by urinalysis or imaging of ureteral stone or other urinary tract etiology.  There is also no evidence of vascular cause.  We will give symptomatic treatment with Zofran, Bentyl, and give fluids.  ----------------------------------------- 11:00 PM on 02/18/2020 -----------------------------------------  The patient reported feeling somewhat better after the Bentyl and Zofran, and stated that the pain is still present but improved.  I gave a dose of IV morphine, and then we attempted a p.o. trial.  The patient is tolerating p.o. without difficulty at this time.  On further discussion with him, he states that he actually has had similar episodes of left lower quadrant pain several times a year for the last few years, and it is thought that it may be due to scarring from his prior colectomy.  Especially given this additional information and his negative CT, there is no evidence of an acute intra-abdominal issue.  The patient feels much more comfortable and wants to go home.  He is stable for discharge at this time.  I counseled him on results of the work-up.  Return precautions given, and he expresses understanding.  ____________________________________________   FINAL CLINICAL IMPRESSION(S) / ED  DIAGNOSES  Final diagnoses:  Left lower quadrant abdominal pain      NEW MEDICATIONS STARTED DURING THIS VISIT:  New Prescriptions   DICYCLOMINE (BENTYL) 10 MG CAPSULE    Take 1 capsule (10 mg total) by mouth 3 (three) times daily as needed for up to 3 days (abdominal cramping).   ONDANSETRON (ZOFRAN ODT) 8 MG DISINTEGRATING TABLET    Take 1 tablet (8 mg total) by  mouth every 8 (eight) hours as needed for nausea or vomiting.     Note:  This document was prepared using Dragon voice recognition software and may include unintentional dictation errors.    Arta Silence, MD 02/18/20 2303

## 2020-02-18 NOTE — ED Triage Notes (Signed)
Pt here for LLQ pain that started dull a few days ago but has gotten severe and sharp. Hx diverticulitis, feels same.  + NVD, cannot keep anything down. Pt reports pain is worse than time when he perforated from his diverticulitis. Denies fever but has had chills.

## 2020-02-18 NOTE — ED Notes (Signed)
Water and crackers provided for po challenge.

## 2020-02-18 NOTE — ED Notes (Signed)
Pt requesting pain meds. Provider at bedside.

## 2020-02-18 NOTE — ED Notes (Signed)
Pt reports no "significant" improvement in pain. md notified, pt appears in no acute distress.

## 2020-07-17 ENCOUNTER — Other Ambulatory Visit: Payer: Self-pay | Admitting: Nurse Practitioner

## 2020-07-17 DIAGNOSIS — M5416 Radiculopathy, lumbar region: Secondary | ICD-10-CM

## 2020-07-17 DIAGNOSIS — G8929 Other chronic pain: Secondary | ICD-10-CM

## 2020-08-03 ENCOUNTER — Ambulatory Visit
Admission: RE | Admit: 2020-08-03 | Discharge: 2020-08-03 | Disposition: A | Discharge status: Home / Self Care | Payer: Managed Care, Other (non HMO) | Source: Ambulatory Visit | Attending: Nurse Practitioner | Admitting: Nurse Practitioner

## 2020-08-03 ENCOUNTER — Other Ambulatory Visit: Payer: Self-pay

## 2020-08-03 DIAGNOSIS — M5442 Lumbago with sciatica, left side: Secondary | ICD-10-CM | POA: Diagnosis present

## 2020-08-03 DIAGNOSIS — M5416 Radiculopathy, lumbar region: Secondary | ICD-10-CM | POA: Insufficient documentation

## 2020-08-03 DIAGNOSIS — G8929 Other chronic pain: Secondary | ICD-10-CM | POA: Insufficient documentation

## 2020-08-03 DIAGNOSIS — M5441 Lumbago with sciatica, right side: Secondary | ICD-10-CM | POA: Diagnosis present

## 2020-08-03 IMAGING — MR MR LUMBAR SPINE W/O CM
5 series · 31 of 48 positions shown · non-contrast
Comparison: [DATE].

CLINICAL DATA: Lower back pain.

EXAM:
MRI LUMBAR SPINE WITHOUT CONTRAST
TECHNIQUE: Multiplanar, multisequence MR imaging of the lumbar spine was
performed. No intravenous contrast was administered.

[Series 5: T2 · sagittal · 4.0mm · 0.81mm/px · 6 of 17 slices shown (1 of 2)]
[im 1/17]
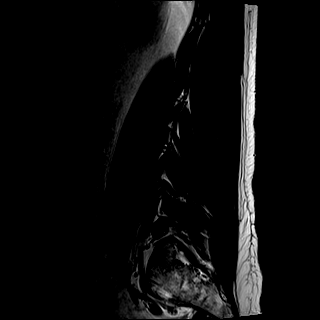
[im 4/17]
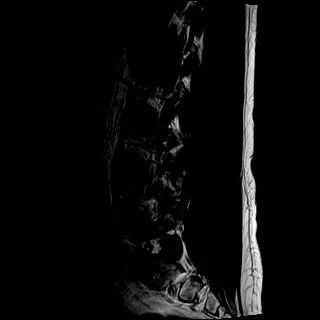
[im 7/17]
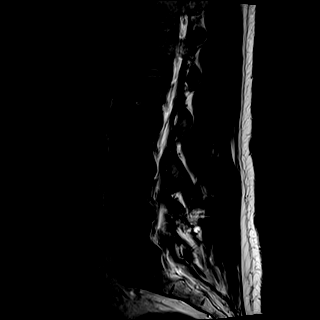
[im 10/17]
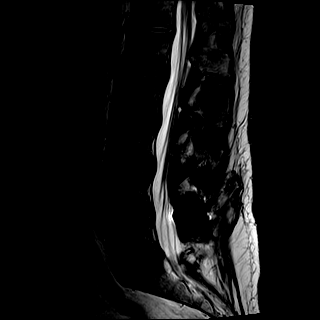
[im 13/17]
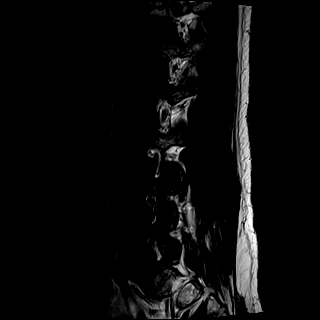
[im 17/17]
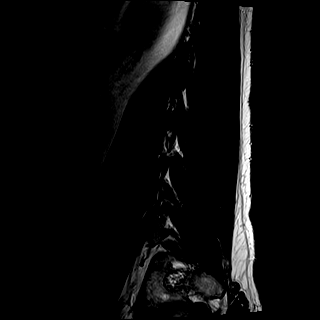

[Series 6: T1 · sagittal · 4.0mm · 0.81mm/px · 6 of 17 slices shown (1 of 2)]
[im 1/17]
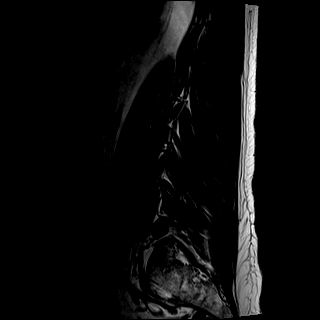
[im 4/17]
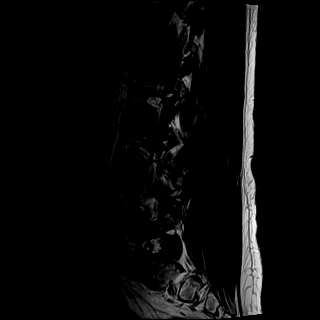
[im 7/17]
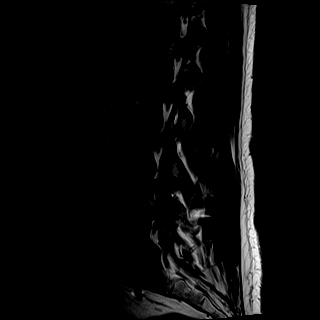
[im 10/17]
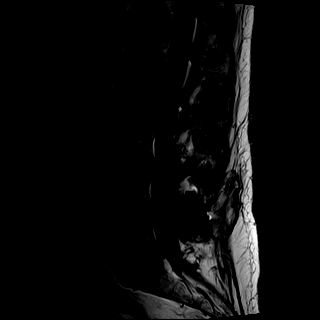
[im 13/17]
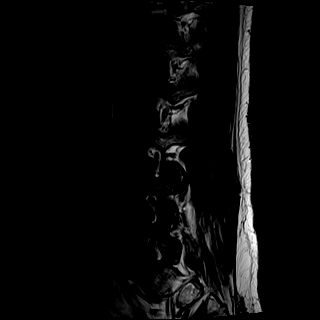
[im 17/17]
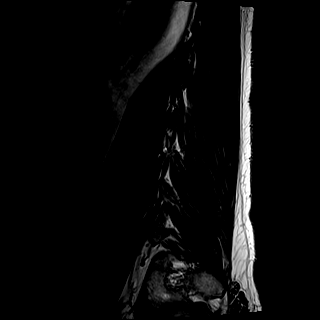

[Series 7: STIR · sagittal · 4.0mm · 0.41mm/px · 1 of 17 slices shown]
[im 1/17]
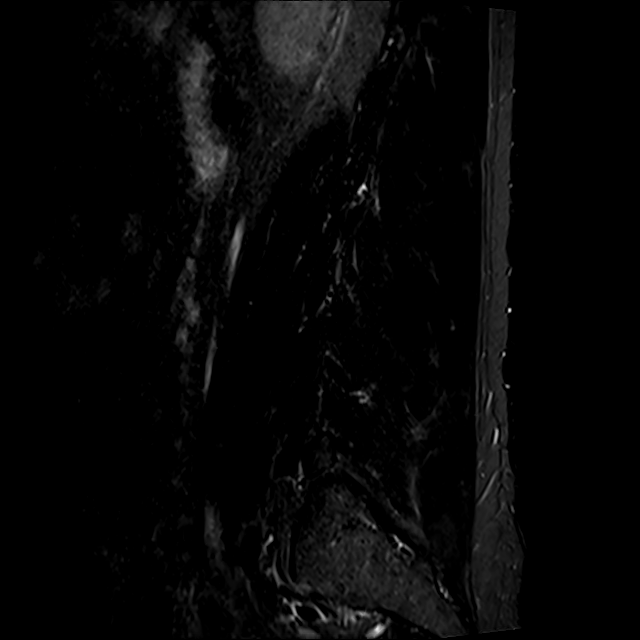

[Series 8: T2 · axial · 4.0mm · 0.78mm/px · z∈[-167,+61]mm · 9 of 40 slices shown (2 of 2)]
[im 1/40]
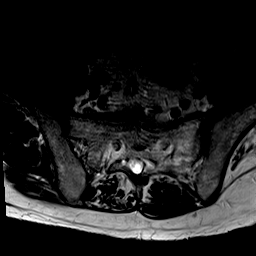
[im 6/40]
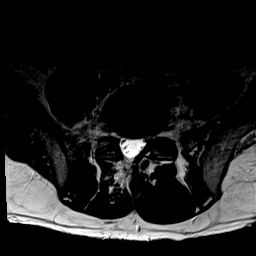
[im 12/40]
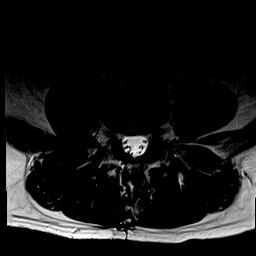
[im 17/40]
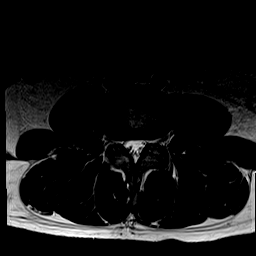
[im 20/40]
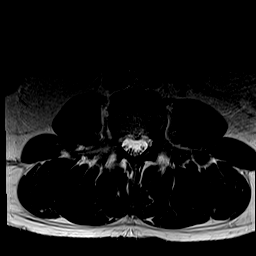
[im 23/40]
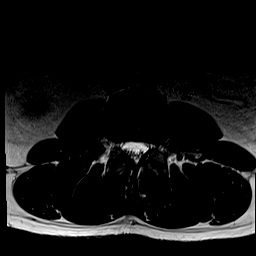
[im 28/40]
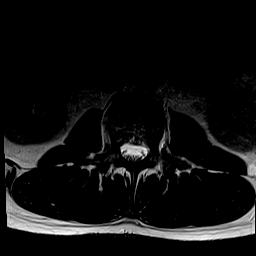
[im 34/40]
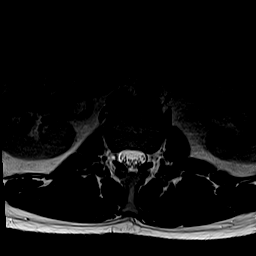
[im 40/40]
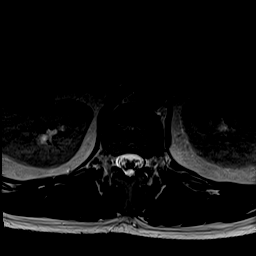

[Series 9: T1 · axial · 4.0mm · 0.39mm/px · z∈[-167,+61]mm · 9 of 40 slices shown (2 of 2)]
[im 1/40]
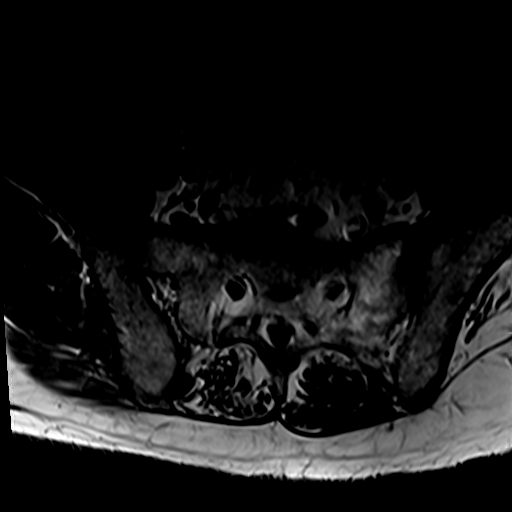
[im 6/40]
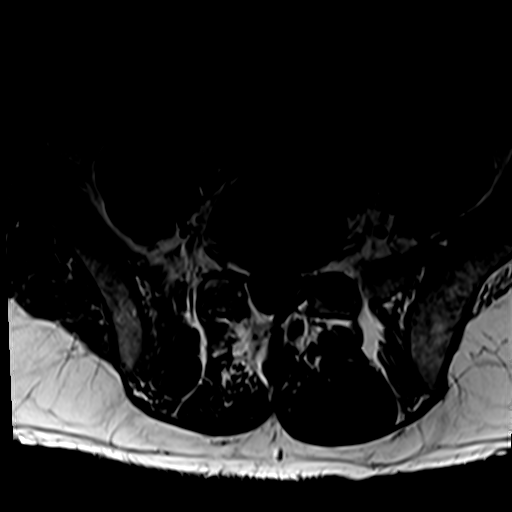
[im 12/40]
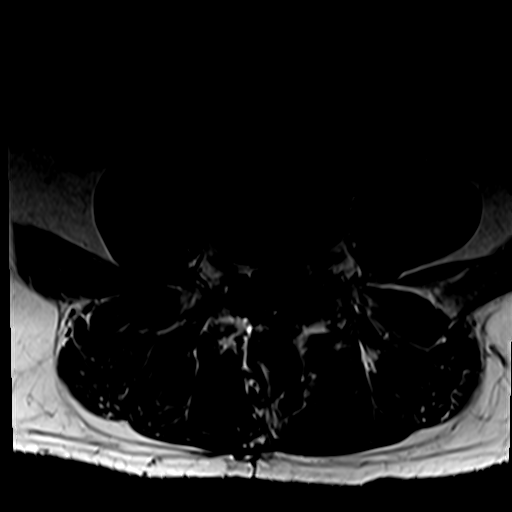
[im 17/40]
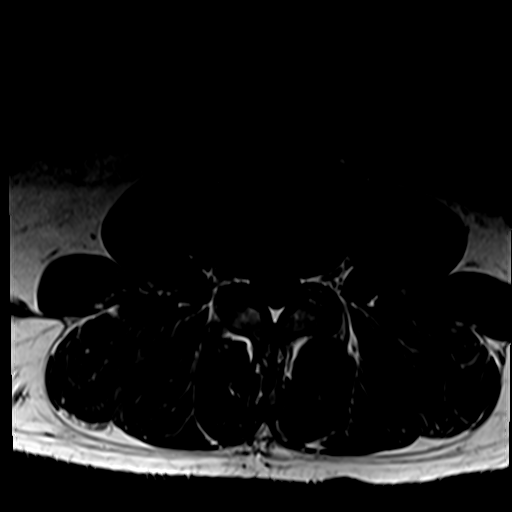
[im 20/40]
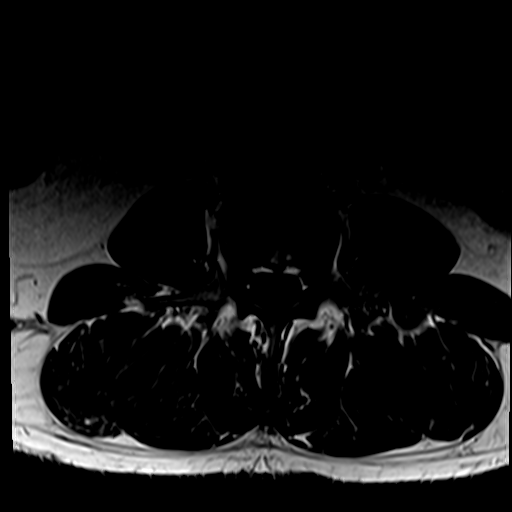
[im 23/40]
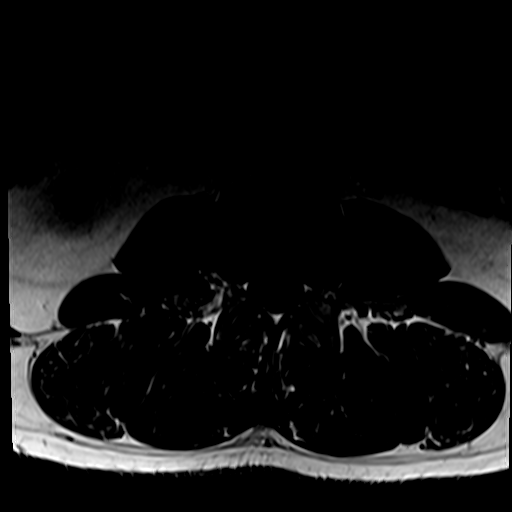
[im 28/40]
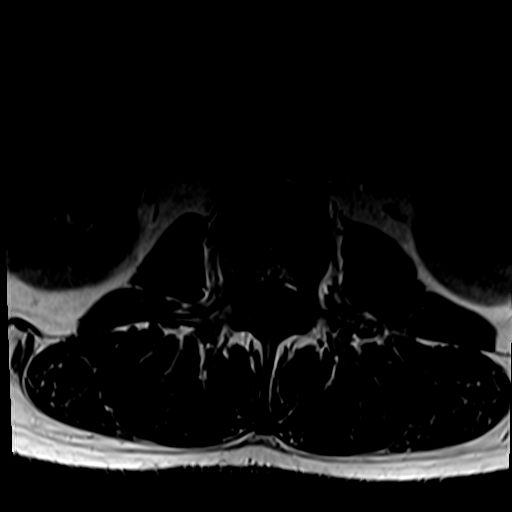
[im 34/40]
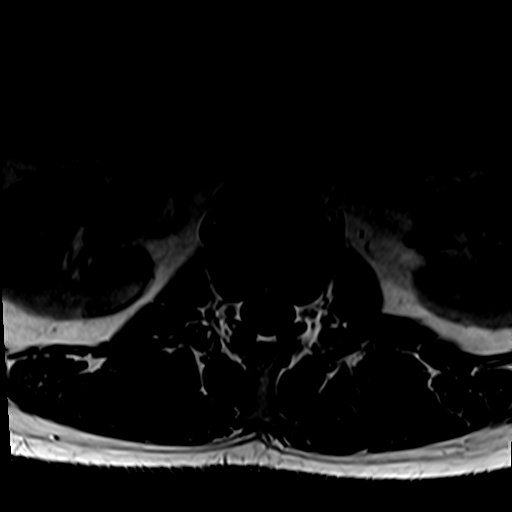
[im 40/40]
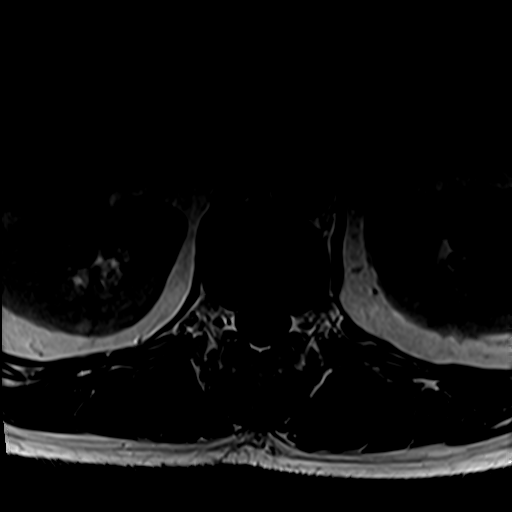

[31 of 48 positions shown; findings below may reference images not displayed]

FINDINGS: Segmentation:  Standard.

Alignment:  Straightening of lordosis.

Vertebrae: Normal bone marrow signal intensity. Vertebral body
heights are preserved. No aggressive osseous lesion. Postsurgical
appearance of the posterior spinal elements at the L4-5, L5-S1
level. Associated susceptibility artifact.

Conus medullaris and cauda equina: Conus extends to the L1 level.
Conus and cauda equina appear normal.

Disc levels: Desiccation most prominent at the L4-5 and L5-S1
levels. Remaining disc spaces are grossly preserved.

L1-2: No significant disc bulge. Patent spinal canal and neural
foramen.

L2-3: Minimal disc bulge.  Patent spinal canal and neural foramen.

L3-4: Mild disc bulge and facet degenerative spurring. Patent spinal
canal. Mild bilateral neural foraminal narrowing.

L4-5: Sequela of posterior decompression. Disc bulge with shallow
central protrusion/annular fissuring. Facet degenerative spurring.
Patent spinal canal. Mild right and moderate left neural foraminal
narrowing.

L5-S1: Sequela of right hemilaminectomy. Bilateral facet
degenerative spurring. Disc bulge with superimposed central
protrusion/annular fissuring abutting the descending right S1 nerve
root. Patent spinal canal. Mild bilateral neural foraminal
narrowing.

Paraspinal and other soft tissues: Postsurgical appearance of the
paraspinal soft tissues.
IMPRESSION: Sequela of L4-5 posterior decompression and L5-S1 right
hemilaminectomy. Patent spinal canal.

Moderate left and mild right neural foraminal narrowing at the L4-5
level.

Shallow central protrusions/annular fissuring at the L4-S1 levels
with abutment of the descending right S1 nerve root. Mild bilateral
L3-4, L5-S1 neural foraminal narrowing.

## 2020-10-22 ENCOUNTER — Emergency Department
Admission: EM | Admit: 2020-10-22 | Discharge: 2020-10-22 | Disposition: A | Discharge status: Home / Self Care | Payer: Managed Care, Other (non HMO) | Attending: Emergency Medicine | Admitting: Emergency Medicine

## 2020-10-22 ENCOUNTER — Other Ambulatory Visit: Payer: Self-pay

## 2020-10-22 DIAGNOSIS — X58XXXA Exposure to other specified factors, initial encounter: Secondary | ICD-10-CM | POA: Diagnosis not present

## 2020-10-22 DIAGNOSIS — S0501XA Injury of conjunctiva and corneal abrasion without foreign body, right eye, initial encounter: Secondary | ICD-10-CM | POA: Diagnosis not present

## 2020-10-22 DIAGNOSIS — R110 Nausea: Secondary | ICD-10-CM | POA: Diagnosis not present

## 2020-10-22 DIAGNOSIS — I10 Essential (primary) hypertension: Secondary | ICD-10-CM | POA: Diagnosis not present

## 2020-10-22 DIAGNOSIS — E876 Hypokalemia: Secondary | ICD-10-CM | POA: Diagnosis not present

## 2020-10-22 DIAGNOSIS — Z87891 Personal history of nicotine dependence: Secondary | ICD-10-CM | POA: Diagnosis not present

## 2020-10-22 DIAGNOSIS — Z79899 Other long term (current) drug therapy: Secondary | ICD-10-CM | POA: Insufficient documentation

## 2020-10-22 DIAGNOSIS — S0591XA Unspecified injury of right eye and orbit, initial encounter: Secondary | ICD-10-CM | POA: Diagnosis present

## 2020-10-22 DIAGNOSIS — E860 Dehydration: Secondary | ICD-10-CM | POA: Diagnosis not present

## 2020-10-22 DIAGNOSIS — R55 Syncope and collapse: Secondary | ICD-10-CM | POA: Insufficient documentation

## 2020-10-22 LAB — MAGNESIUM: Magnesium: 2 mg/dL (ref 1.7–2.4)

## 2020-10-22 LAB — CBC WITH DIFFERENTIAL/PLATELET
Abs Immature Granulocytes: 0.07 10*3/uL (ref 0.00–0.07)
Basophils Absolute: 0 10*3/uL (ref 0.0–0.1)
Basophils Relative: 0 %
Eosinophils Absolute: 0 10*3/uL (ref 0.0–0.5)
Eosinophils Relative: 0 %
HCT: 51.7 % (ref 39.0–52.0)
Hemoglobin: 17.8 g/dL — ABNORMAL HIGH (ref 13.0–17.0)
Immature Granulocytes: 1 %
Lymphocytes Relative: 9 %
Lymphs Abs: 1.3 10*3/uL (ref 0.7–4.0)
MCH: 30.8 pg (ref 26.0–34.0)
MCHC: 34.4 g/dL (ref 30.0–36.0)
MCV: 89.4 fL (ref 80.0–100.0)
Monocytes Absolute: 0.8 10*3/uL (ref 0.1–1.0)
Monocytes Relative: 5 %
Neutro Abs: 12.4 10*3/uL — ABNORMAL HIGH (ref 1.7–7.7)
Neutrophils Relative %: 85 %
Platelets: 194 10*3/uL (ref 150–400)
RBC: 5.78 MIL/uL (ref 4.22–5.81)
RDW: 12.1 % (ref 11.5–15.5)
WBC: 14.6 10*3/uL — ABNORMAL HIGH (ref 4.0–10.5)
nRBC: 0 % (ref 0.0–0.2)

## 2020-10-22 LAB — COMPREHENSIVE METABOLIC PANEL
ALT: 21 U/L (ref 0–44)
AST: 39 U/L (ref 15–41)
Albumin: 4.4 g/dL (ref 3.5–5.0)
Alkaline Phosphatase: 45 U/L (ref 38–126)
Anion gap: 13 (ref 5–15)
BUN: 15 mg/dL (ref 6–20)
CO2: 25 mmol/L (ref 22–32)
Calcium: 8.7 mg/dL — ABNORMAL LOW (ref 8.9–10.3)
Chloride: 96 mmol/L — ABNORMAL LOW (ref 98–111)
Creatinine, Ser: 0.98 mg/dL (ref 0.61–1.24)
GFR, Estimated: >60 mL/min (ref >60)
Glucose, Bld: 59 mg/dL — ABNORMAL LOW (ref 70–99)
Potassium: 3.3 mmol/L — ABNORMAL LOW (ref 3.5–5.1)
Sodium: 134 mmol/L — ABNORMAL LOW (ref 135–145)
Total Bilirubin: 2 mg/dL — ABNORMAL HIGH (ref 0.3–1.2)
Total Protein: 7.6 g/dL (ref 6.5–8.1)

## 2020-10-22 LAB — TROPONIN I (HIGH SENSITIVITY)
Troponin I (High Sensitivity): 4 ng/L (ref <18)
Troponin I (High Sensitivity): 3 ng/L (ref <18)

## 2020-10-22 LAB — CK: Total CK: 643 U/L — ABNORMAL HIGH (ref 49–397)

## 2020-10-22 LAB — CBG MONITORING, ED: Glucose-Capillary: 96 mg/dL (ref 70–99)

## 2020-10-22 MED ORDER — TAPENTADOL HCL ER 50 MG PO TB12
100.0000 mg | ORAL_TABLET | Freq: Once | ORAL | Status: AC
  Administered 2020-10-22: 100 mg via ORAL
  Filled 2020-10-22: qty 1

## 2020-10-22 MED ORDER — POTASSIUM CHLORIDE CRYS ER 20 MEQ PO TBCR
40.0000 meq | EXTENDED_RELEASE_TABLET | Freq: Once | ORAL | Status: AC
  Administered 2020-10-22: 40 meq via ORAL
  Filled 2020-10-22: qty 2

## 2020-10-22 MED ORDER — ERYTHROMYCIN 5 MG/GM OP OINT: 1.0000 "application " | TOPICAL_OINTMENT | Freq: Three times a day (TID) | OPHTHALMIC | 0 refills | Status: AC

## 2020-10-22 MED ORDER — TETRACAINE HCL 0.5 % OP SOLN
2.0000 [drp] | Freq: Once | OPHTHALMIC | Status: AC
  Administered 2020-10-22: 2 [drp] via OPHTHALMIC
  Filled 2020-10-22: qty 4

## 2020-10-22 MED ORDER — LACTATED RINGERS IV BOLUS
1000.0000 mL | Freq: Once | INTRAVENOUS | Status: AC
  Administered 2020-10-22: 1000 mL via INTRAVENOUS

## 2020-10-22 MED ORDER — FLUORESCEIN SODIUM 1 MG OP STRP
1.0000 | ORAL_STRIP | Freq: Once | OPHTHALMIC | Status: AC
  Administered 2020-10-22: 1 via OPHTHALMIC
  Filled 2020-10-22: qty 1

## 2020-10-22 MED ORDER — MORPHINE SULFATE 15 MG PO TABS
15.0000 mg | ORAL_TABLET | Freq: Once | ORAL | Status: AC
  Administered 2020-10-22: 15 mg via ORAL
  Filled 2020-10-22: qty 1

## 2020-10-22 NOTE — ED Notes (Signed)
Pt noted to be hypoglycemic on CMP. Pt provided with juice

## 2020-10-22 NOTE — ED Triage Notes (Signed)
Pt presents to the [facility] via EMS with c/o generalized weakness, N/V, and near syncope. Pt states that he was cutting down limb in his yard when he began having intermittent episodes of generalized weakness. Pt states that symptoms began to worsen and had near syncopal episode. Pt reports vomiting en route with EMS. Pt was given 4mg  zofran IM.

## 2020-10-22 NOTE — ED Provider Notes (Addendum)
[facility] Emergency Department Provider Note  ____________________________________________   Event Date/Time   First MD Initiated Contact with Patient 10/22/20 1603     (approximate)  I have reviewed the triage vital signs and the nursing notes.   HISTORY  Chief Complaint Near Syncope, Weakness, and Vomiting    HPI Mark Austin is a 40 y.o. male here with weakness, near syncope, vomiting.  The patient states that he was outside for significant amount of time earlier today.  He was  doing yard work and was working very hard.  He states he has not worked out or exercised and egg decent amount of time.  He was trying to drink water but had not eaten much.  He reports that he was feeling increasingly weak and tired as he was working outside.  Prior to arrival, he tried to rest to catch his energy and was not able to do so.  He called for help.  He states that he could not get enough energy to walk.  He felt very generally weak.  He called his wife and she tried to help him up but he had to stop several times due to weakness.  Reports he has generalized weakness, diffuse cramping.  No chest pain.  No shortness of breath.  He felt mildly lightheaded at this time but this is improved.  He was sweating a significant amount.  No recent medication changes.  No other complaints.       Past Medical History:  Diagnosis Date  . Back ache   . Diverticulitis   . GERD (gastroesophageal reflux disease)   . History of kidney stones   . Hypertension   . Inguinal hernia     There are no problems to display for this patient.   Past Surgical History:  Procedure Laterality Date  . back surgeries     x5 lumbar L4L5  . BACK SURGERY    . BOWEL RESECTION  2016  . CHOLECYSTECTOMY  2017  . COLON SURGERY    . COLONOSCOPY    . EXCISION MASS LOWER EXTREMETIES Left 11/30/2019   Procedure: EXCISION MASS LOWER EXTREMETIES, Left Hip;  Surgeon: Jules Husbands, MD;  Location: [facility]  ORS;  Service: General;  Laterality: Left;  . HERNIA REPAIR    . INGUINAL HERNIA REPAIR Left   . SHOULDER SURGERY Right    rotator cuff tear  . XI ROBOTIC ASSISTED INGUINAL HERNIA REPAIR WITH MESH Right 10/08/2019   Procedure: XI ROBOTIC ASSISTED INGUINAL HERNIA REPAIR WITH MESH;  Surgeon: Jules Husbands, MD;  Location: [facility] ORS;  Service: General;  Laterality: Right;    Prior to Admission medications   Medication Sig Start Date End Date Taking? Authorizing Provider  erythromycin ophthalmic ointment Place 1 application into the right eye 3 (three) times daily for 5 days. 10/22/20 10/27/20 Yes Duffy Bruce, MD  amLODipine (NORVASC) 10 MG tablet Take 10 mg by mouth daily.  09/16/19   [provider]  atorvastatin (LIPITOR) 10 MG tablet Take 10 mg by mouth at bedtime.  11/09/19   [provider]  chlorthalidone (HYGROTON) 25 MG tablet Take 25 mg by mouth daily.  11/08/19   [provider]  dicyclomine (BENTYL) 10 MG capsule Take 1 capsule (10 mg total) by mouth 3 (three) times daily as needed for up to 3 days (abdominal cramping). 02/18/20 02/21/20  Arta Silence, MD  HYDROcodone-acetaminophen (NORCO/VICODIN) 5-325 MG tablet Take 1 tablet by mouth every 6 (six) hours as  needed for moderate pain. 11/30/19   Pabon, Diego F, MD  lansoprazole (PREVACID) 30 MG capsule Take 30 mg by mouth as needed.  10/20/19 11/24/19  [provider]  lisinopril (ZESTRIL) 40 MG tablet Take 40 mg by mouth daily.  07/07/19   [provider]  morphine (MSIR) 15 MG tablet Take 15 mg by mouth in the morning and at bedtime.    [provider]  ondansetron (ZOFRAN ODT) 8 MG disintegrating tablet Take 1 tablet (8 mg total) by mouth every 8 (eight) hours as needed for nausea or vomiting. 02/18/20   Arta Silence, MD  prazosin (MINIPRESS) 1 MG capsule Take 3 mg by mouth at bedtime.     [provider]  propranolol ER (INDERAL LA) 160 MG SR capsule Take 160 mg by  mouth daily.    [provider]  tapentadol (NUCYNTA ER) 100 MG 12 hr tablet Take 100 mg by mouth in the morning and at bedtime.  07/06/10   [provider]  testosterone cypionate (DEPOTESTOTERONE CYPIONATE) 100 MG/ML injection Inject 80 mg into the muscle every 7 (seven) days. For IM use only  Thursday's    [provider]  varenicline (CHANTIX PAK) 0.5 MG X 11 & 1 MG X 42 tablet Take 0.5 mg by mouth 2 (two) times daily.     [provider]    Allergies Toradol [ketorolac tromethamine], Tramadol, and Carisoprodol  Family History  Problem Relation Age of Onset  . Healthy Mother   . Healthy Father     Social History Social History   Tobacco Use  . Smoking status: Former Smoker    Packs/day: 0.75    Types: Cigarettes    Quit date: 11/23/2019    Years since quitting: 0.9  . Smokeless tobacco: Never Used  Vaping Use  . Vaping Use: Never used  Substance Use Topics  . Alcohol use: Never  . Drug use: Not Currently    Review of Systems  Review of Systems  Constitutional: Positive for fatigue. Negative for chills and fever.  HENT: Negative for sore throat.   Respiratory: Negative for shortness of breath.   Cardiovascular: Negative for chest pain.  Gastrointestinal: Negative for abdominal pain.  Genitourinary: Negative for flank pain.  Musculoskeletal: Positive for arthralgias and myalgias. Negative for neck pain.  Skin: Negative for rash and wound.  Allergic/Immunologic: Negative for immunocompromised state.  Neurological: Positive for weakness and light-headedness. Negative for numbness.  Hematological: Does not bruise/bleed easily.  All other systems reviewed and are negative.    ____________________________________________  PHYSICAL EXAM:      VITAL SIGNS: ED Triage Vitals [10/22/20 1538]  Enc Vitals Group     BP (!) 161/100     Pulse Rate 80     Resp 18     Temp 98 F (36.7 C)     Temp Source Oral     SpO2 99 %     Weight  215 lb (97.5 kg)     Height 5\' 11"  (1.803 m)     Head Circumference      Peak Flow      Pain Score 6     Pain Loc      Pain Edu?      Excl. in [place]?      Physical Exam Vitals and nursing note reviewed.  Constitutional:      General: He is not in acute distress.    Appearance: He is well-developed.  HENT:  Head: Normocephalic and atraumatic.     Mouth/Throat:     Mouth: Mucous membranes are dry.  Eyes:     Conjunctiva/sclera: Conjunctivae normal.  Cardiovascular:     Rate and Rhythm: Normal rate and regular rhythm.     Heart sounds: Normal heart sounds. No murmur heard. No friction rub.  Pulmonary:     Effort: Pulmonary effort is normal. No respiratory distress.     Breath sounds: Normal breath sounds. No wheezing or rales.  Abdominal:     General: There is no distension.     Palpations: Abdomen is soft.     Tenderness: There is no abdominal tenderness.  Musculoskeletal:     Cervical back: Neck supple.  Skin:    General: Skin is warm.     Capillary Refill: Capillary refill takes less than 2 seconds.  Neurological:     Mental Status: He is alert and oriented to person, place, and time.     Motor: No abnormal muscle tone.       ____________________________________________   LABS (all labs ordered are listed, but only abnormal results are displayed)  Labs Reviewed  CBC WITH DIFFERENTIAL/PLATELET - Abnormal; Notable for the following components:      Result Value   WBC 14.6 (*)    Hemoglobin 17.8 (*)    Neutro Abs 12.4 (*)    All other components within normal limits  COMPREHENSIVE METABOLIC PANEL - Abnormal; Notable for the following components:   Sodium 134 (*)    Potassium 3.3 (*)    Chloride 96 (*)    Glucose, Bld 59 (*)    Calcium 8.7 (*)    Total Bilirubin 2.0 (*)    All other components within normal limits  CK - Abnormal; Notable for the following components:   Total CK 643 (*)    All other components within normal limits  MAGNESIUM  CBG  MONITORING, ED  TROPONIN I (HIGH SENSITIVITY)  TROPONIN I (HIGH SENSITIVITY)    ____________________________________________  EKG: Normal sinus rhythm, ventricular rate 78.  QRS 114, QTc 452.  No acute ST elevations or depressions.  Probable LVH. ________________________________________  RADIOLOGY All imaging, including plain films, CT scans, and ultrasounds, independently reviewed by me, and interpretations confirmed via formal radiology reads.  ED MD interpretation:     Official radiology report(s): No results found.  ____________________________________________  PROCEDURES   Procedure(s) performed (including Critical Care):  Procedures  ____________________________________________  INITIAL IMPRESSION / MDM / [place] / ED COURSE  As part of my medical decision making, I reviewed the following data within the [place] notes reviewed and incorporated, Old chart reviewed, Notes from prior ED visits, and [place] Controlled Substance Database       *Mark Austin was evaluated in Emergency Department on 10/22/2020 for the symptoms described in the history of present illness. He was evaluated in the context of the global COVID-19 pandemic, which necessitated consideration that the patient might be at risk for infection with the SARS-CoV-2 virus that causes COVID-19. Institutional protocols and algorithms that pertain to the evaluation of patients at risk for COVID-19 are in a state of rapid change based on information released by regulatory bodies including the CDC and federal and state organizations. These policies and algorithms were followed during the patient's care in the ED.  Some ED evaluations and interventions may be delayed as a result of limited staffing during the pandemic.*     Medical Decision Making: Pleasant 40 year old male here  with near syncopal episode after being out and doing yard work.  Patient appears fatigued but has no  focal deficits on arrival.  EKG is nonischemic and troponins are negative x2, do not suspect ACS.  Lab work shows likely dehydration, hyperglycemia, and hypokalemia with mild CK elevation and likely reactive leukocytosis.  I suspect patient likely had dehydration combined with mild overexertion and possible hypoglycemia in the setting of not eating anything prior to heavy physical activity today.  Symptoms are all resolved with fluids and eating in the ED.  He is not on insulin.  CK mildly elevated at 643 but he has been given fluids and has no evidence of AKI.  Will plan to have patient limit strenuous exercise, encourage hydration, and discharged with close outpatient follow-up.  He has a history of similar mild rhabdo in the setting of exercise which has improved with fluids.  Addendum: Patient also complaining of mild right eye irritation in the ED.  He reportedly was hit by a branch while doing yard work.  Fluorescein staining shows superficial linear corneal abrasion.  No evidence of globe rupture.  No evidence of corneal ulceration.  Will start on Romycin.  ____________________________________________  FINAL CLINICAL IMPRESSION(S) / ED DIAGNOSES  Final diagnoses:  Dehydration  Hypokalemia  Abrasion of right cornea, initial encounter     MEDICATIONS GIVEN DURING THIS VISIT:  Medications  lactated ringers bolus 1,000 mL (0 mLs Intravenous Stopped 10/22/20 1843)  morphine (MSIR) tablet 15 mg (15 mg Oral Given 10/22/20 1902)  tapentadol (NUCYNTA) 12 hr tablet 100 mg (100 mg Oral Given 10/22/20 1902)  lactated ringers bolus 1,000 mL (1,000 mLs Intravenous New Bag/Given 10/22/20 1931)  tetracaine (PONTOCAINE) 0.5 % ophthalmic solution 2 drop (2 drops Right Eye Given 10/22/20 1932)  fluorescein ophthalmic strip 1 strip (1 strip Right Eye Given 10/22/20 1933)  potassium chloride SA (KLOR-CON) CR tablet 40 mEq (40 mEq Oral Given 10/22/20 1936)     ED Discharge Orders         Ordered     erythromycin ophthalmic ointment  3 times daily        10/22/20 2011           Note:  This document was prepared using Dragon voice recognition software and may include unintentional dictation errors.   Duffy Bruce, MD 10/22/20 2037    Duffy Bruce, MD 10/22/20 2038

## 2021-01-13 ENCOUNTER — Encounter: Payer: Self-pay | Admitting: Emergency Medicine

## 2021-01-13 ENCOUNTER — Other Ambulatory Visit: Payer: Self-pay

## 2021-01-13 ENCOUNTER — Emergency Department
Admission: EM | Admit: 2021-01-13 | Discharge: 2021-01-13 | Disposition: A | Discharge status: Home / Self Care | Payer: Managed Care, Other (non HMO) | Attending: Emergency Medicine | Admitting: Emergency Medicine

## 2021-01-13 ENCOUNTER — Emergency Department: Payer: Managed Care, Other (non HMO)

## 2021-01-13 DIAGNOSIS — K219 Gastro-esophageal reflux disease without esophagitis: Secondary | ICD-10-CM | POA: Insufficient documentation

## 2021-01-13 DIAGNOSIS — Z79899 Other long term (current) drug therapy: Secondary | ICD-10-CM | POA: Insufficient documentation

## 2021-01-13 DIAGNOSIS — I10 Essential (primary) hypertension: Secondary | ICD-10-CM | POA: Diagnosis not present

## 2021-01-13 DIAGNOSIS — Z87891 Personal history of nicotine dependence: Secondary | ICD-10-CM | POA: Insufficient documentation

## 2021-01-13 DIAGNOSIS — M549 Dorsalgia, unspecified: Secondary | ICD-10-CM | POA: Insufficient documentation

## 2021-01-13 DIAGNOSIS — Z87442 Personal history of urinary calculi: Secondary | ICD-10-CM | POA: Diagnosis not present

## 2021-01-13 DIAGNOSIS — R1032 Left lower quadrant pain: Secondary | ICD-10-CM | POA: Diagnosis present

## 2021-01-13 LAB — URINALYSIS, COMPLETE (UACMP) WITH MICROSCOPIC
Bacteria, UA: NONE SEEN
Bilirubin Urine: NEGATIVE
Glucose, UA: NEGATIVE mg/dL
Hgb urine dipstick: NEGATIVE
Ketones, ur: NEGATIVE mg/dL
Leukocytes,Ua: NEGATIVE
Nitrite: NEGATIVE
Protein, ur: NEGATIVE mg/dL
Specific Gravity, Urine: 1.004 — ABNORMAL LOW (ref 1.005–1.030)
Squamous Epithelial / HPF: NONE SEEN (ref 0–5)
pH: 6 (ref 5.0–8.0)

## 2021-01-13 LAB — COMPREHENSIVE METABOLIC PANEL
ALT: 14 U/L (ref 0–44)
AST: 24 U/L (ref 15–41)
Albumin: 4.6 g/dL (ref 3.5–5.0)
Alkaline Phosphatase: 54 U/L (ref 38–126)
Anion gap: 9 (ref 5–15)
BUN: 11 mg/dL (ref 6–20)
CO2: 27 mmol/L (ref 22–32)
Calcium: 9.6 mg/dL (ref 8.9–10.3)
Chloride: 100 mmol/L (ref 98–111)
Creatinine, Ser: 0.87 mg/dL (ref 0.61–1.24)
GFR, Estimated: >60 mL/min (ref >60)
Glucose, Bld: 110 mg/dL — ABNORMAL HIGH (ref 70–99)
Potassium: 4.2 mmol/L (ref 3.5–5.1)
Sodium: 136 mmol/L (ref 135–145)
Total Bilirubin: 1 mg/dL (ref 0.3–1.2)
Total Protein: 8 g/dL (ref 6.5–8.1)

## 2021-01-13 LAB — CBC
HCT: 53.2 % — ABNORMAL HIGH (ref 39.0–52.0)
Hemoglobin: 18.5 g/dL — ABNORMAL HIGH (ref 13.0–17.0)
MCH: 31.3 pg (ref 26.0–34.0)
MCHC: 34.8 g/dL (ref 30.0–36.0)
MCV: 89.9 fL (ref 80.0–100.0)
Platelets: 195 10*3/uL (ref 150–400)
RBC: 5.92 MIL/uL — ABNORMAL HIGH (ref 4.22–5.81)
RDW: 12.5 % (ref 11.5–15.5)
WBC: 12.5 10*3/uL — ABNORMAL HIGH (ref 4.0–10.5)
nRBC: 0 % (ref 0.0–0.2)

## 2021-01-13 LAB — LIPASE, BLOOD: Lipase: 101 U/L — ABNORMAL HIGH (ref 11–51)

## 2021-01-13 IMAGING — CT CT ABD-PELV W/ CM
2 of 5 series · 16 of 46 positions shown, 18 images · IV contrast (APPLIED)
Comparison: CT abdomen pelvis [DATE].

CLINICAL DATA: Left lower quadrant pain with diarrhea and nausea.
History of diverticulitis.

EXAM:
CT ABDOMEN AND PELVIS WITH CONTRAST
TECHNIQUE: Multidetector CT imaging of the abdomen and pelvis was performed
using the standard protocol following bolus administration of
intravenous contrast.
CONTRAST:  100mL OMNIPAQUE IOHEXOL 300 MG/ML  SOLN

[Series 2: routine abd/pel with · axial · 0.75mm/px · z∈[-915,-455]mm · 13 of 104 slices shown, 15 images]
[im 6/104  soft-tissue]
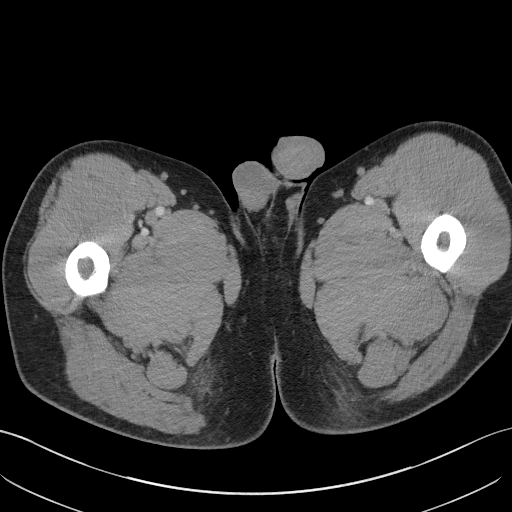
[im 6/104  bone]
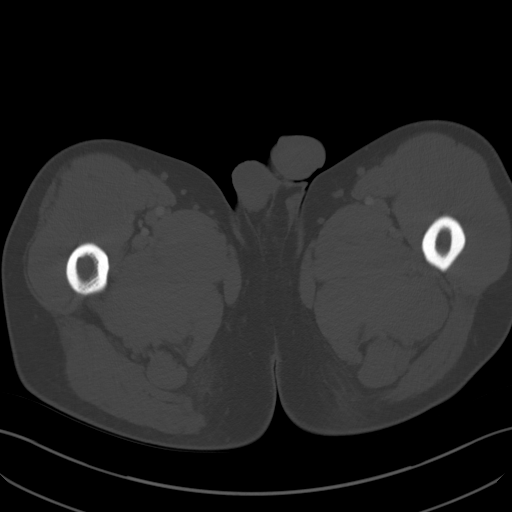
[im 16/104  soft-tissue]
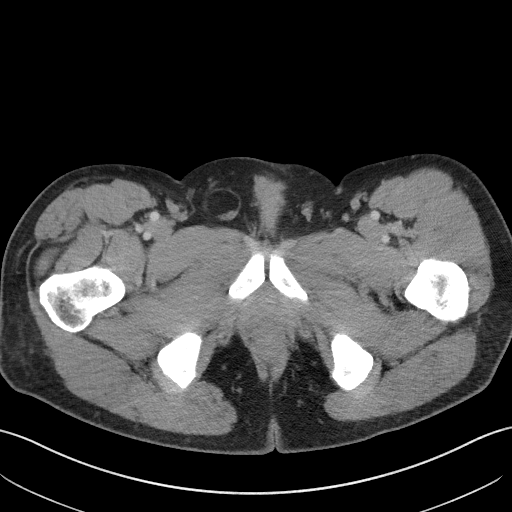
[im 21/104  soft-tissue]
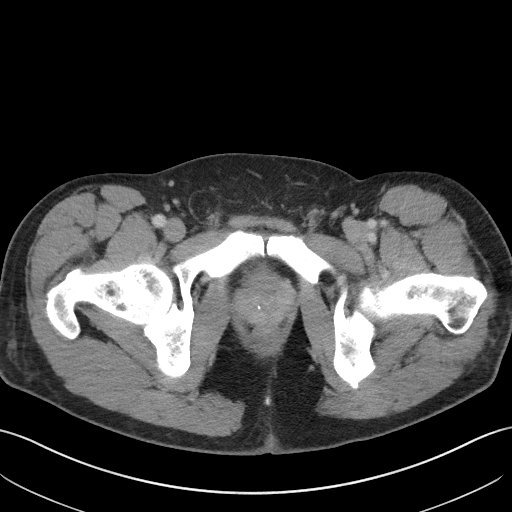
[im 31/104  soft-tissue]
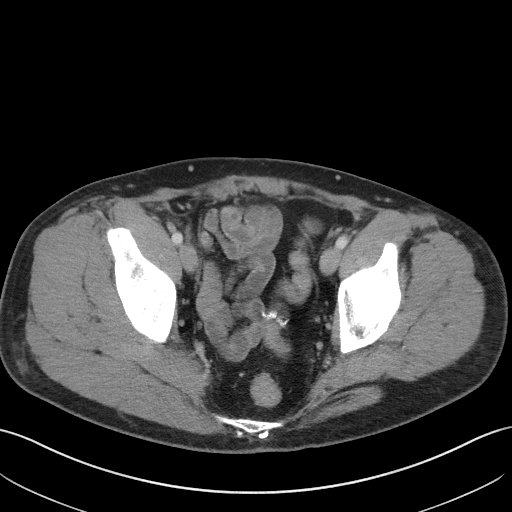
[im 37/104  soft-tissue]
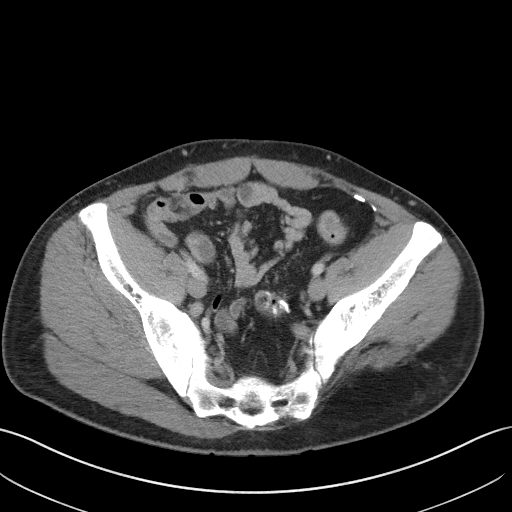
[im 47/104  soft-tissue]
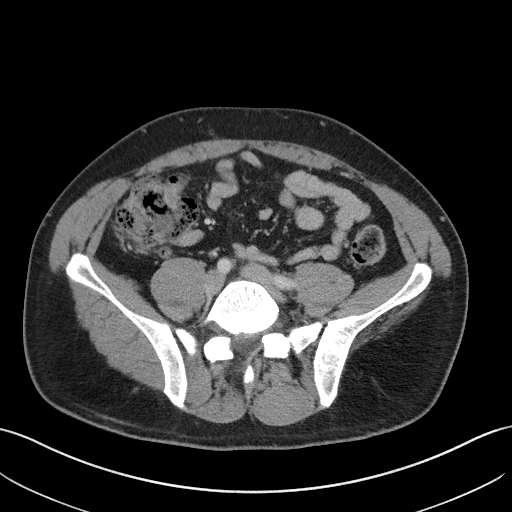
[im 52/104  soft-tissue]
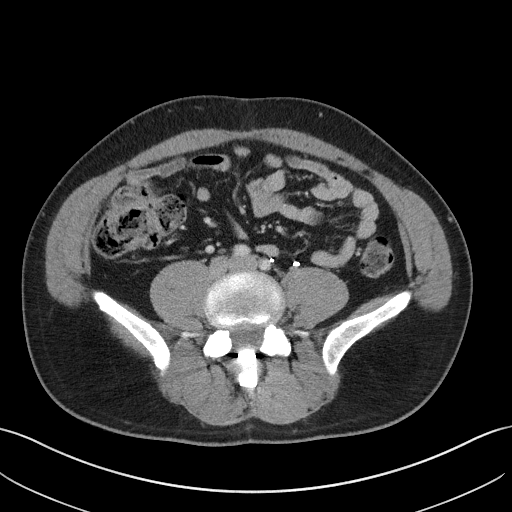
[im 57/104  soft-tissue]
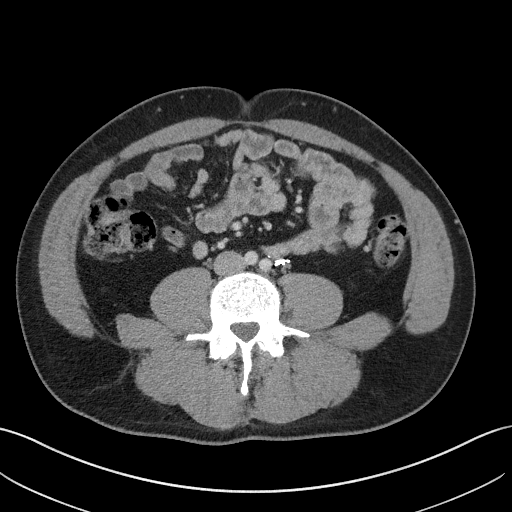
[im 67/104  soft-tissue]
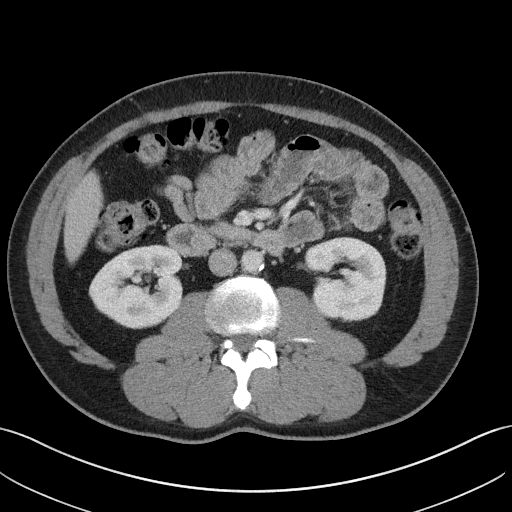
[im 67/104  bone]
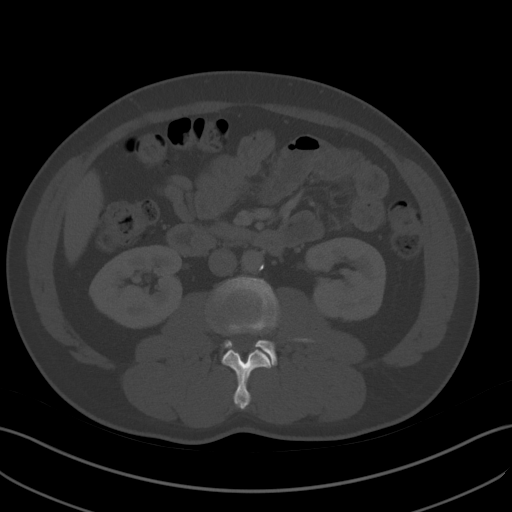
[im 73/104  soft-tissue]
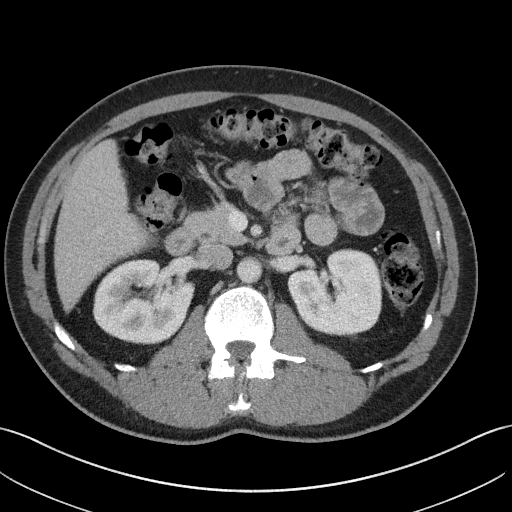
[im 83/104  soft-tissue]
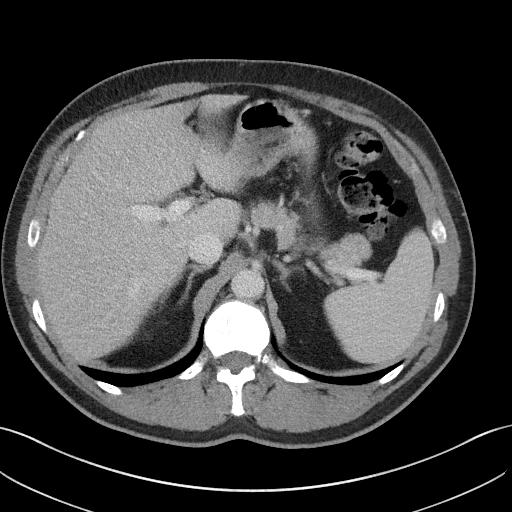
[im 88/104  soft-tissue]
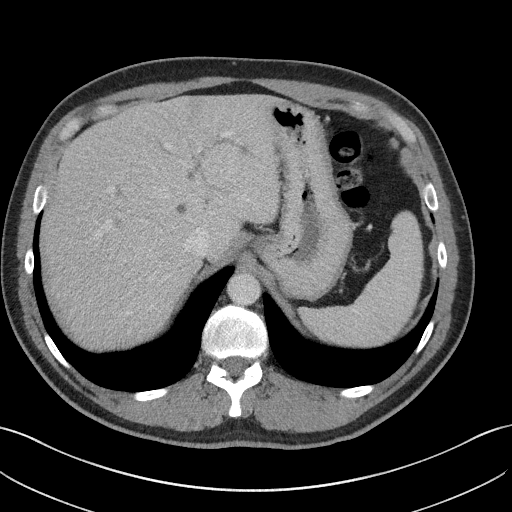
[im 98/104  soft-tissue]
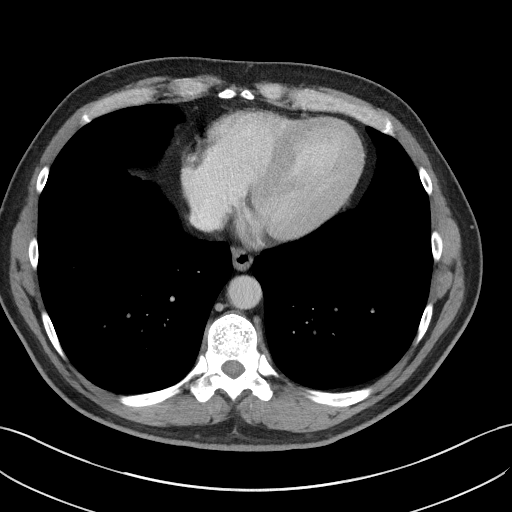

[Series 5: coronal st · coronal · 0.74mm/px · 3 of 94 slices shown]
[im 32/94  soft-tissue]
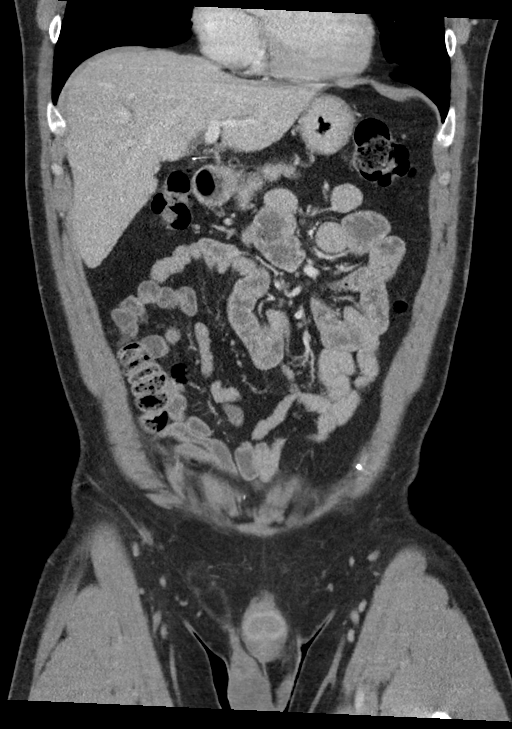
[im 42/94  soft-tissue]
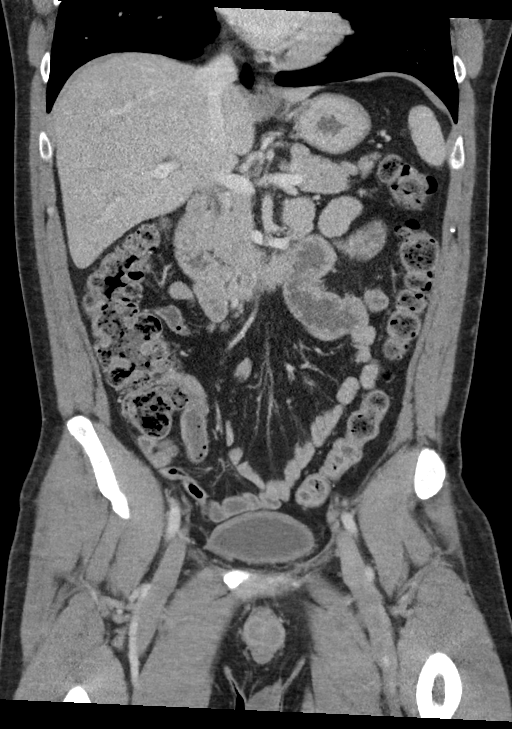
[im 52/94  soft-tissue]
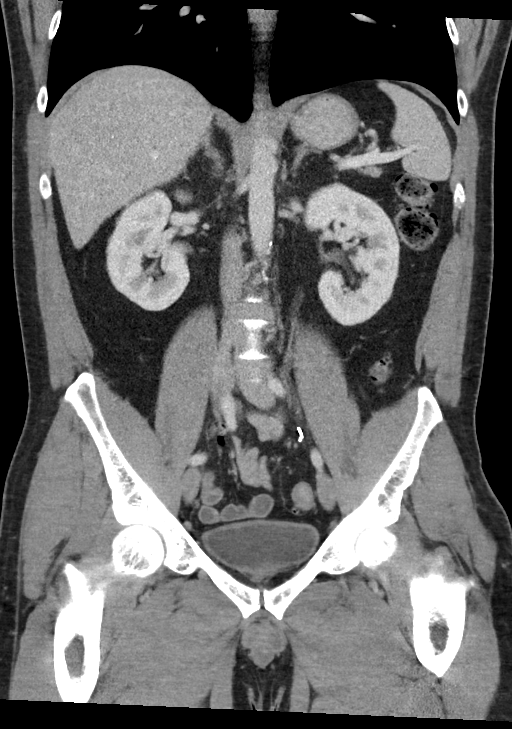

[16 of 46 positions shown; findings below may reference images not displayed]

FINDINGS: Lower chest: Normal heart size. Lung bases are clear. No pleural
effusion.

Hepatobiliary: Liver is normal in size and contour. Similar appendix
cyst left hepatic lobe. Gallbladder surgically absent. No
intrahepatic or extrahepatic biliary ductal dilatation.

Pancreas: Unremarkable

Spleen: Unremarkable

Adrenals/Urinary Tract: Normal adrenal glands. Kidneys enhance
symmetrically with contrast. No hydronephrosis. Urinary bladder is
unremarkable.

Stomach/Bowel: Postsurgical changes involving the sigmoid colon.
Sigmoid colonic diverticulosis. No CT evidence to suggest acute
diverticulitis. Normal appendix. No evidence for bowel obstruction.
No free fluid or free intraperitoneal air. Normal morphology of the
stomach.

Vascular/Lymphatic: Normal caliber abdominal aorta. Peripheral
calcified atherosclerotic plaque. No retroperitoneal
lymphadenopathy.

Reproductive: Heterogeneous prostate.

Other: Small fat containing right inguinal hernia. Postsurgical
clips retroperitoneum.

Musculoskeletal: No aggressive or acute appearing osseous lesions.
IMPRESSION: No acute process within the abdomen or pelvis. No CT evidence to
suggest acute diverticulitis.

## 2021-01-13 MED ORDER — IOHEXOL 300 MG/ML  SOLN
100.0000 mL | Freq: Once | INTRAMUSCULAR | Status: AC | PRN
  Administered 2021-01-13: 100 mL via INTRAVENOUS

## 2021-01-13 MED ORDER — DICYCLOMINE HCL 10 MG PO CAPS: 10.0000 mg | ORAL_CAPSULE | Freq: Four times a day (QID) | ORAL | 0 refills | Status: DC

## 2021-01-13 MED ORDER — HYDROMORPHONE HCL 1 MG/ML IJ SOLN
1.0000 mg | Freq: Once | INTRAMUSCULAR | Status: AC
  Administered 2021-01-13: 1 mg via INTRAVENOUS
  Filled 2021-01-13: qty 1

## 2021-01-13 MED ORDER — SODIUM CHLORIDE 0.9 % IV SOLN
1000.0000 mL | Freq: Once | INTRAVENOUS | Status: AC
  Administered 2021-01-13: 1000 mL via INTRAVENOUS

## 2021-01-13 MED ORDER — MORPHINE SULFATE (PF) 4 MG/ML IV SOLN
4.0000 mg | Freq: Once | INTRAVENOUS | Status: AC
  Administered 2021-01-13: 4 mg via INTRAVENOUS
  Filled 2021-01-13: qty 1

## 2021-01-13 MED ORDER — ONDANSETRON HCL 4 MG/2ML IJ SOLN
4.0000 mg | Freq: Once | INTRAMUSCULAR | Status: AC
  Administered 2021-01-13: 4 mg via INTRAVENOUS
  Filled 2021-01-13: qty 2

## 2021-01-13 MED ORDER — DICYCLOMINE HCL 10 MG/ML IM SOLN
20.0000 mg | Freq: Once | INTRAMUSCULAR | Status: AC
  Administered 2021-01-13: 20 mg via INTRAMUSCULAR
  Filled 2021-01-13: qty 2

## 2021-01-13 NOTE — ED Triage Notes (Signed)
Pt reports that he developed LLQ pain, with diarrhea and nausea for the last two weeks just has been getting worse. Has hx of diverticulitis.

## 2021-01-13 NOTE — ED Provider Notes (Signed)
[facility] Emergency Department Provider Note   ____________________________________________    I have reviewed the triage vital signs and the nursing notes.   HISTORY  Chief Complaint Abdominal Pain     HPI Mark Austin is a 40 y.o. male who presents with complaints of left lower quadrant abdominal pain.  Patient describes cramping severe abdominal pain.  Reports a history of diverticulitis and a history of kidney stones.  He also notes that he has had this pain in the past with severe cramping pain at "no one has been able to figure out what it is ".  Does see pain management for back pain however reports that is more central back pain  Past Medical History:  Diagnosis Date   Back ache    Diverticulitis    GERD (gastroesophageal reflux disease)    History of kidney stones    Hypertension    Inguinal hernia     There are no problems to display for this patient.   Past Surgical History:  Procedure Laterality Date   back surgeries     x5 lumbar L4L5   BACK SURGERY     BOWEL RESECTION  2016   CHOLECYSTECTOMY  2017   COLON SURGERY     COLONOSCOPY     EXCISION MASS LOWER EXTREMETIES Left 11/30/2019   Procedure: EXCISION MASS LOWER EXTREMETIES, Left Hip;  Surgeon: Jules Husbands, MD;  Location: [facility] ORS;  Service: General;  Laterality: Left;   HERNIA REPAIR     INGUINAL HERNIA REPAIR Left    SHOULDER SURGERY Right    rotator cuff tear   XI ROBOTIC ASSISTED INGUINAL HERNIA REPAIR WITH MESH Right 10/08/2019   Procedure: XI ROBOTIC ASSISTED INGUINAL HERNIA REPAIR WITH MESH;  Surgeon: Jules Husbands, MD;  Location: [facility] ORS;  Service: General;  Laterality: Right;    Prior to Admission medications   Medication Sig Start Date End Date Taking? Authorizing Provider  dicyclomine (BENTYL) 10 MG capsule Take 1 capsule (10 mg total) by mouth 4 (four) times daily for 14 days. 01/13/21 01/27/21 Yes Lavonia Drafts, MD  amLODipine (NORVASC) 10 MG tablet Take  10 mg by mouth daily.  09/16/19   [provider]  atorvastatin (LIPITOR) 10 MG tablet Take 10 mg by mouth at bedtime.  11/09/19   [provider]  chlorthalidone (HYGROTON) 25 MG tablet Take 25 mg by mouth daily.  11/08/19   [provider]  HYDROcodone-acetaminophen (NORCO/VICODIN) 5-325 MG tablet Take 1 tablet by mouth every 6 (six) hours as needed for moderate pain. 11/30/19   Pabon, Diego F, MD  lansoprazole (PREVACID) 30 MG capsule Take 30 mg by mouth as needed.  10/20/19 11/24/19  [provider]  lisinopril (ZESTRIL) 40 MG tablet Take 40 mg by mouth daily.  07/07/19   [provider]  morphine (MSIR) 15 MG tablet Take 15 mg by mouth in the morning and at bedtime.    [provider]  ondansetron (ZOFRAN ODT) 8 MG disintegrating tablet Take 1 tablet (8 mg total) by mouth every 8 (eight) hours as needed for nausea or vomiting. 02/18/20   Arta Silence, MD  prazosin (MINIPRESS) 1 MG capsule Take 3 mg by mouth at bedtime.     [provider]  propranolol ER (INDERAL LA) 160 MG SR capsule Take 160 mg by mouth daily.    [provider]  tapentadol (NUCYNTA ER) 100 MG 12 hr tablet Take 100 mg by mouth in the morning  and at bedtime.  07/06/10   [provider]  testosterone cypionate (DEPOTESTOTERONE CYPIONATE) 100 MG/ML injection Inject 80 mg into the muscle every 7 (seven) days. For IM use only  Thursday's    [provider]  varenicline (CHANTIX PAK) 0.5 MG X 11 & 1 MG X 42 tablet Take 0.5 mg by mouth 2 (two) times daily.     [provider]     Allergies Toradol [ketorolac tromethamine], Tramadol, and Carisoprodol  Family History  Problem Relation Age of Onset   Healthy Mother    Healthy Father     Social History Social History   Tobacco Use   Smoking status: Former    Packs/day: 0.75    Pack years: 0.00    Types: Cigarettes    Quit date: 11/23/2019    Years since quitting: 1.1    Smokeless tobacco: Never  Vaping Use   Vaping Use: Never used  Substance Use Topics   Alcohol use: Never   Drug use: Not Currently    Review of Systems  Constitutional: No fever/chills Eyes: No visual changes.  ENT: No sore throat. Cardiovascular: Denies chest pain. Respiratory: Denies shortness of breath. Gastrointestinal: As above Genitourinary: Negative for dysuria.  No hematuria Musculoskeletal: Negative for back pain. Skin: Negative for rash. Neurological: Negative for headaches or weakness   ____________________________________________   PHYSICAL EXAM:  VITAL SIGNS: ED Triage Vitals  Enc Vitals Group     BP 01/13/21 0818 (!) 208/125     Pulse Rate 01/13/21 0818 (!) 104     Resp 01/13/21 0816 20     Temp 01/13/21 0816 98.4 F (36.9 C)     Temp Source 01/13/21 0816 Oral     SpO2 01/13/21 0900 97 %     Weight 01/13/21 0818 96.6 kg (213 lb)     Height 01/13/21 0818 1.803 m (5\' 11" )     Head Circumference --      Peak Flow --      Pain Score 01/13/21 0818 8     Pain Loc --      Pain Edu? --      Excl. in [place]? --     Constitutional: Alert and oriented.  Eyes: Conjunctivae are normal.   Nose: No congestion/rhinnorhea.  Cardiovascular: Normal rate, regular rhythm. Grossly normal heart sounds.  Good peripheral circulation. Respiratory: Normal respiratory effort.  No retractions. Lungs CTAB. Gastrointestinal: Soft, left lower quadrant tenderness palpation, moderate.  No distention.  No CVA tenderness.  Musculoskeletal: No lower extremity tenderness nor edema.  Warm and well perfused Neurologic:  Normal speech and language. No gross focal neurologic deficits are appreciated.  Skin:  Skin is warm, dry and intact. No rash noted. Psychiatric: Mood and affect are normal. Speech and behavior are normal.  ____________________________________________   LABS (all labs ordered are listed, but only abnormal results are displayed)  Labs Reviewed  LIPASE, BLOOD -  Abnormal; Notable for the following components:      Result Value   Lipase 101 (*)    All other components within normal limits  COMPREHENSIVE METABOLIC PANEL - Abnormal; Notable for the following components:   Glucose, Bld 110 (*)    All other components within normal limits  CBC - Abnormal; Notable for the following components:   WBC 12.5 (*)    RBC 5.92 (*)    Hemoglobin 18.5 (*)    HCT 53.2 (*)    All other components within normal limits  URINALYSIS, COMPLETE (UACMP) WITH  MICROSCOPIC - Abnormal; Notable for the following components:   Color, Urine STRAW (*)    APPearance CLEAR (*)    Specific Gravity, Urine 1.004 (*)    All other components within normal limits   ____________________________________________  EKG  None ____________________________________________  RADIOLOGY  CT abdomen pelvis reviewed by me, no acute abnormality ____________________________________________   PROCEDURES  Procedure(s) performed: No  Procedures   Critical Care performed: No ____________________________________________   INITIAL IMPRESSION / ASSESSMENT AND PLAN / ED COURSE  Pertinent labs & imaging results that were available during my care of the patient were reviewed by me and considered in my medical decision making (see chart for details).   Patient presents with left lower quad abdominal pain as noted above, history of this in the past which has been unexplained however also history of ureterolithiasis and diverticulitis.  Will treat with IV morphine, IV Zofran obtain CT abdomen pelvis, urine, labs  Lab work overall reassuring, nonspecific elevation of white blood cell count.  CT scan without acute abnormality.  Urinalysis unremarkable.  Additional dose of IV narcotics given with significant improvement.  Patient feeling much better and is pain-free after treatment.  Unclear cause of pain.  Recommend follow-up with GI for further evaluation of these intermittent painful  cramping spells.    ____________________________________________   FINAL CLINICAL IMPRESSION(S) / ED DIAGNOSES  Final diagnoses:  Left lower quadrant abdominal pain        Note:  This document was prepared using Dragon voice recognition software and may include unintentional dictation errors.    Lavonia Drafts, MD 01/13/21 [phone]

## 2021-01-15 ENCOUNTER — Other Ambulatory Visit: Payer: Self-pay

## 2021-01-15 ENCOUNTER — Emergency Department: Payer: Managed Care, Other (non HMO)

## 2021-01-15 ENCOUNTER — Emergency Department
Admission: EM | Admit: 2021-01-15 | Discharge: 2021-01-15 | Disposition: A | Discharge status: Home / Self Care | Payer: Managed Care, Other (non HMO) | Attending: Student in an Organized Health Care Education/Training Program | Admitting: Student in an Organized Health Care Education/Training Program

## 2021-01-15 ENCOUNTER — Encounter: Payer: Self-pay | Admitting: Emergency Medicine

## 2021-01-15 DIAGNOSIS — K219 Gastro-esophageal reflux disease without esophagitis: Secondary | ICD-10-CM | POA: Diagnosis not present

## 2021-01-15 DIAGNOSIS — Z79899 Other long term (current) drug therapy: Secondary | ICD-10-CM | POA: Diagnosis not present

## 2021-01-15 DIAGNOSIS — I10 Essential (primary) hypertension: Secondary | ICD-10-CM | POA: Insufficient documentation

## 2021-01-15 DIAGNOSIS — R1032 Left lower quadrant pain: Secondary | ICD-10-CM | POA: Diagnosis present

## 2021-01-15 LAB — CBC
HCT: 54.3 % — ABNORMAL HIGH (ref 39.0–52.0)
Hemoglobin: 19 g/dL — ABNORMAL HIGH (ref 13.0–17.0)
MCH: 31.5 pg (ref 26.0–34.0)
MCHC: 35 g/dL (ref 30.0–36.0)
MCV: 89.9 fL (ref 80.0–100.0)
Platelets: 188 10*3/uL (ref 150–400)
RBC: 6.04 MIL/uL — ABNORMAL HIGH (ref 4.22–5.81)
RDW: 12.4 % (ref 11.5–15.5)
WBC: 9.9 10*3/uL (ref 4.0–10.5)
nRBC: 0 % (ref 0.0–0.2)

## 2021-01-15 LAB — COMPREHENSIVE METABOLIC PANEL
ALT: 21 U/L (ref 0–44)
AST: 29 U/L (ref 15–41)
Albumin: 4.4 g/dL (ref 3.5–5.0)
Alkaline Phosphatase: 57 U/L (ref 38–126)
Anion gap: 6 (ref 5–15)
BUN: 11 mg/dL (ref 6–20)
CO2: 30 mmol/L (ref 22–32)
Calcium: 9.3 mg/dL (ref 8.9–10.3)
Chloride: 100 mmol/L (ref 98–111)
Creatinine, Ser: 0.93 mg/dL (ref 0.61–1.24)
GFR, Estimated: >60 mL/min (ref >60)
Glucose, Bld: 103 mg/dL — ABNORMAL HIGH (ref 70–99)
Potassium: 4.2 mmol/L (ref 3.5–5.1)
Sodium: 136 mmol/L (ref 135–145)
Total Bilirubin: 1 mg/dL (ref 0.3–1.2)
Total Protein: 8 g/dL (ref 6.5–8.1)

## 2021-01-15 LAB — URINALYSIS, COMPLETE (UACMP) WITH MICROSCOPIC
Bacteria, UA: NONE SEEN
Bilirubin Urine: NEGATIVE
Glucose, UA: NEGATIVE mg/dL
Hgb urine dipstick: NEGATIVE
Ketones, ur: NEGATIVE mg/dL
Leukocytes,Ua: NEGATIVE
Nitrite: NEGATIVE
Protein, ur: NEGATIVE mg/dL
Specific Gravity, Urine: 1.029 (ref 1.005–1.030)
pH: 5 (ref 5.0–8.0)

## 2021-01-15 LAB — LIPASE, BLOOD: Lipase: 93 U/L — ABNORMAL HIGH (ref 11–51)

## 2021-01-15 IMAGING — CT CT CTA ABD/PEL W/CM AND/OR W/O CM
2 of 9 series · 12 of 46 positions shown, 17 images · IV contrast (omnipaque)
Comparison: [DATE]

CLINICAL DATA: 40-year-old male with abdominal pain.

EXAM:
CT ANGIOGRAPHY ABDOMEN AND PELVIS WITH CONTRAST AND WITHOUT CONTRAST
TECHNIQUE: Multidetector CT imaging of the abdomen and pelvis was performed
using the standard protocol during bolus administration of
intravenous contrast. Multiplanar reconstructed images and MIPs were
obtained and reviewed to evaluate the vascular anatomy.
CONTRAST:  100mL OMNIPAQUE IOHEXOL 350 MG/ML SOLN

[Series 6: axial venous · axial · portal-venous · 0.80mm/px · z∈[-577,-135]mm · 10 of 263 slices shown, 15 images]
[im 21/263  soft-tissue]
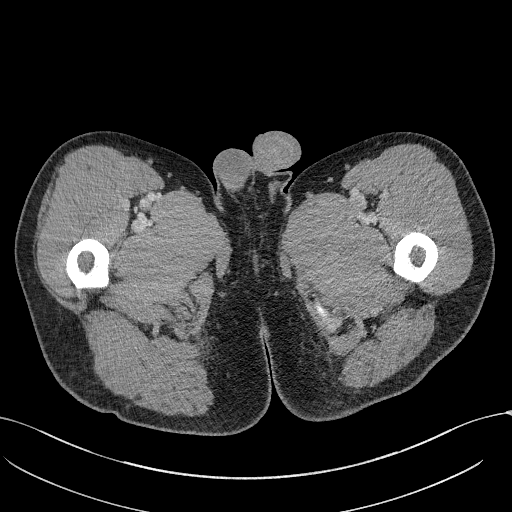
[im 21/263  bone]
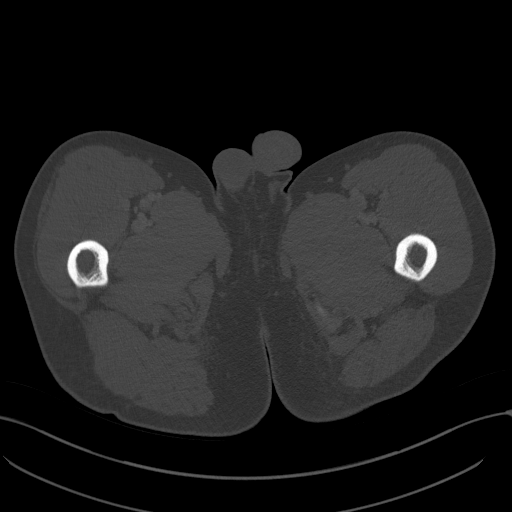
[im 61/263  soft-tissue]
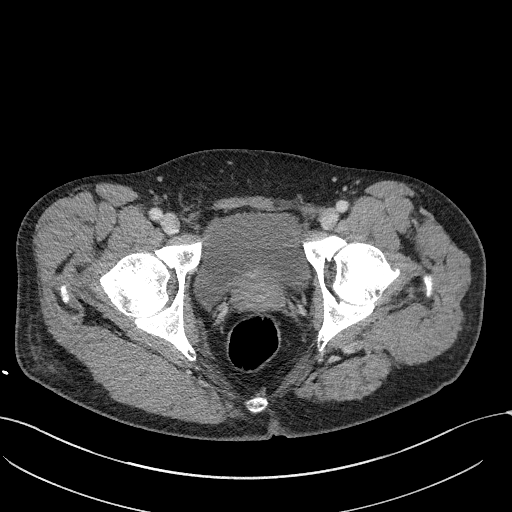
[im 81/263  soft-tissue]
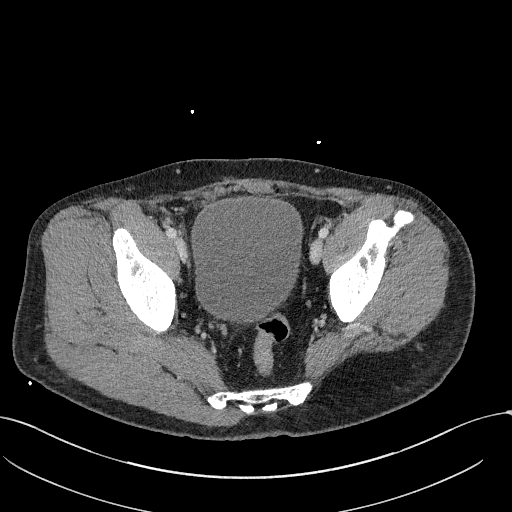
[im 101/263  soft-tissue]
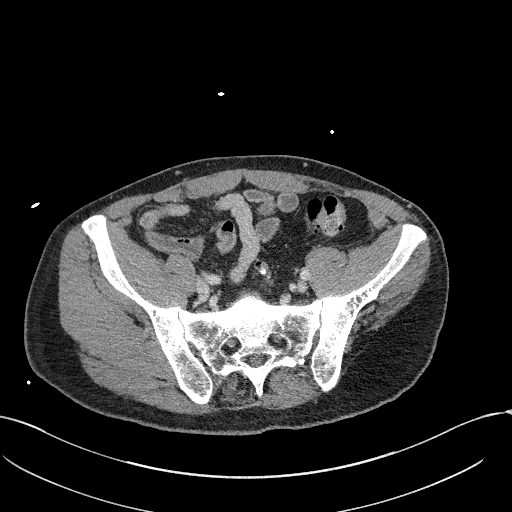
[im 142/263  soft-tissue]
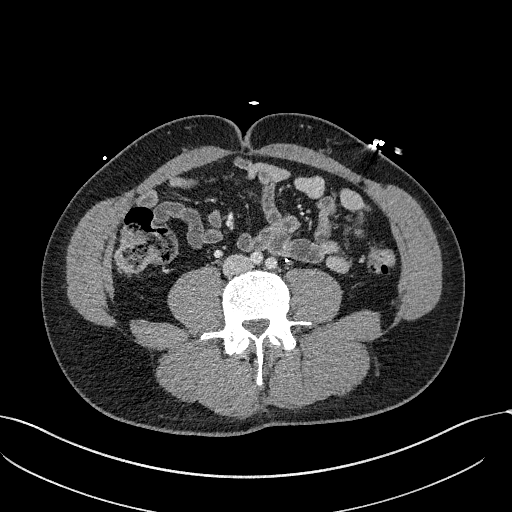
[im 162/263  soft-tissue]
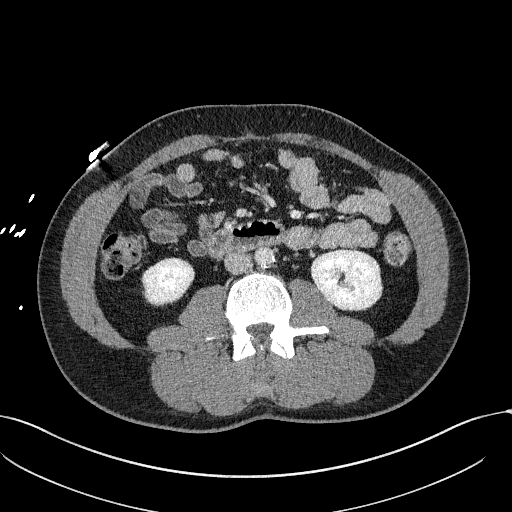
[im 182/263  soft-tissue]
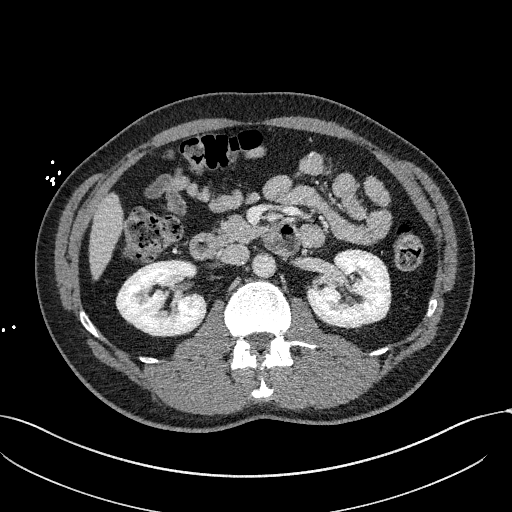
[im 182/263  lung]
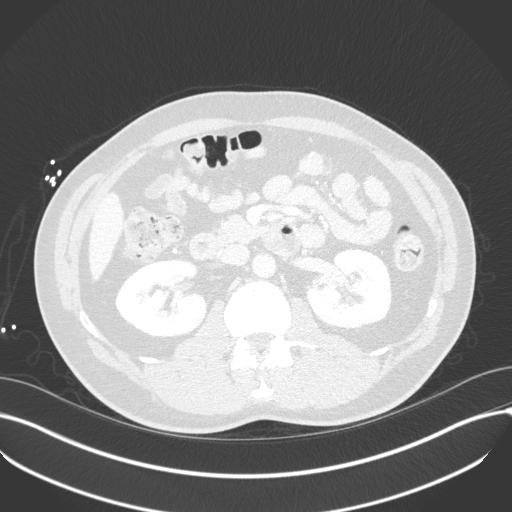
[im 202/263  lung]
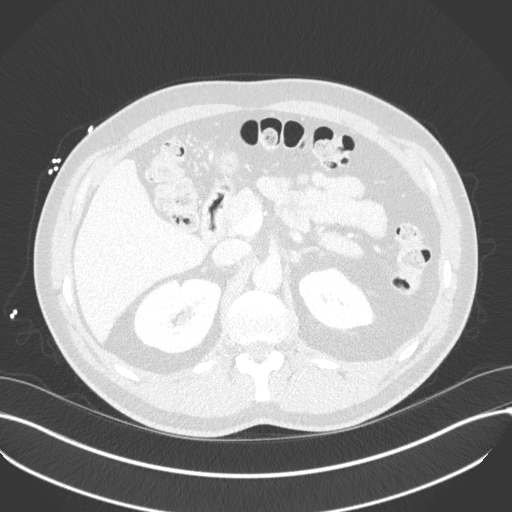
[im 222/263  soft-tissue]
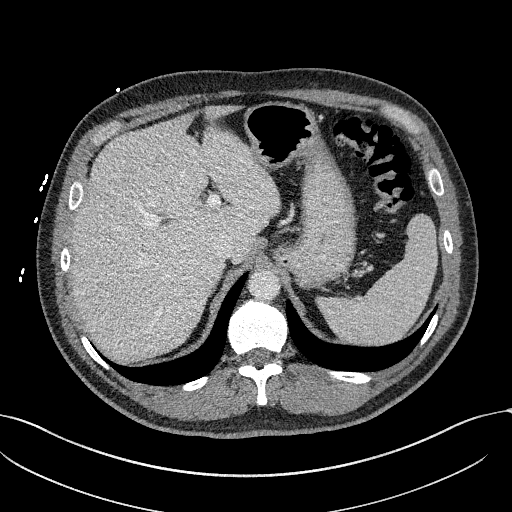
[im 222/263  lung]
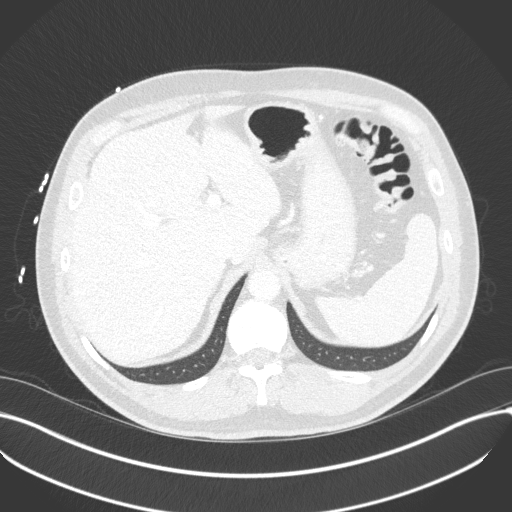
[im 242/263  soft-tissue]
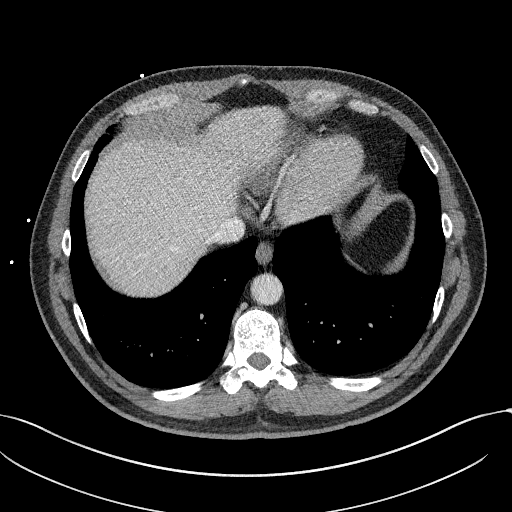
[im 242/263  lung]
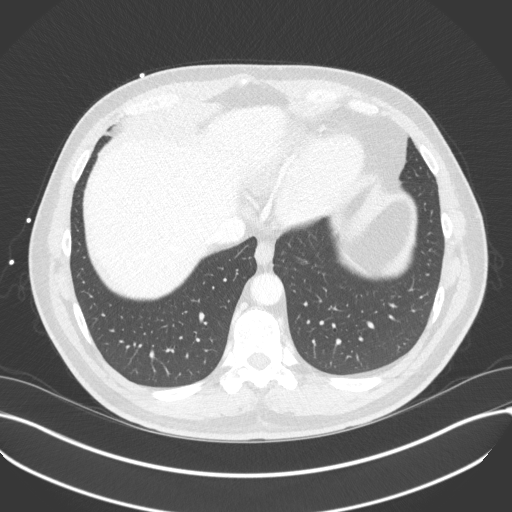
[im 242/263  bone]
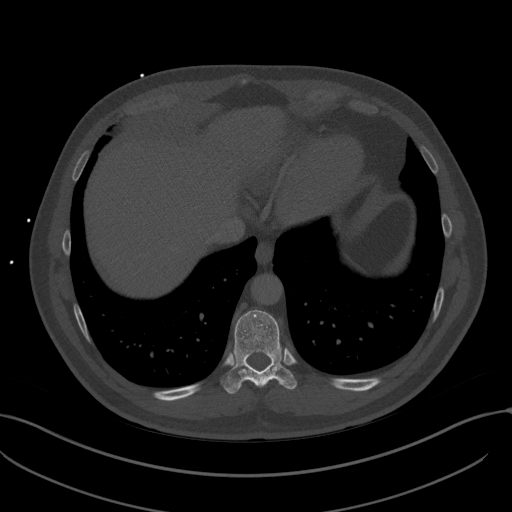

[Series 13: coronal venous mpr · coronal · portal-venous · 0.83mm/px · 2 of 151 slices shown]
[im 51/151  soft-tissue]
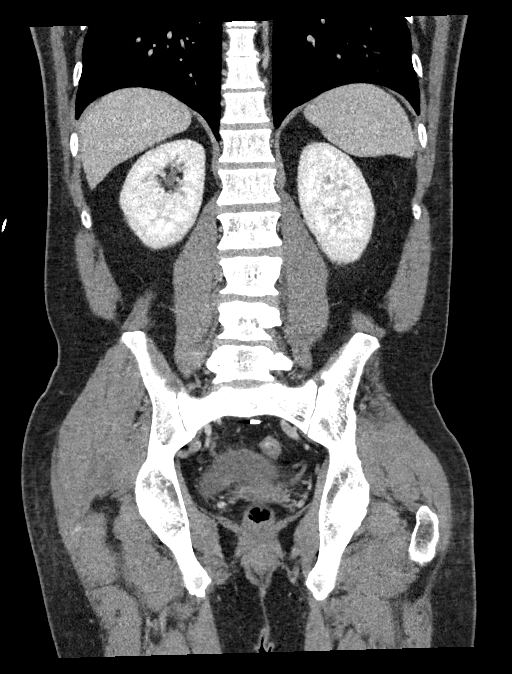
[im 101/151  soft-tissue]
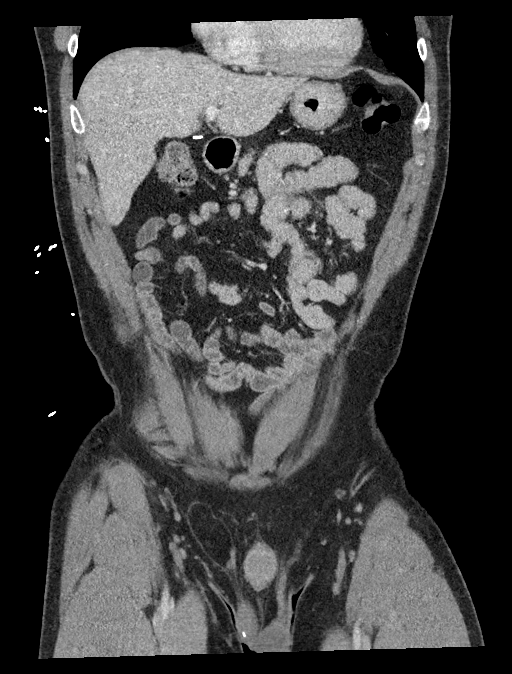

[12 of 46 positions shown; findings below may reference images not displayed]

FINDINGS: VASCULAR

Aorta: Atherosclerosis without signficant stenosis, dissection, or
aneurysm.

Celiac: Patent without evidence of aneurysm, dissection, vasculitis
or significant stenosis.

SMA: Patent without evidence of aneurysm, dissection, vasculitis or
significant stenosis.

Renals: Single bilateral renal arteries are patent without evidence
of aneurysm, dissection, vasculitis, fibromuscular dysplasia or
significant stenosis.

IMA: Patent without evidence of aneurysm, dissection, vasculitis or
significant stenosis.

Inflow: Patent without evidence of aneurysm, dissection, vasculitis
or significant stenosis.

Proximal Outflow: Bilateral common femoral and visualized portions
of the superficial and profunda femoral arteries are patent without
evidence of aneurysm, dissection, vasculitis or significant
stenosis.

Veins: No venous abnormality.

Review of the MIP images confirms the above findings.

NON-VASCULAR

Lower chest: No acute abnormality.

Hepatobiliary: The liver is normal in size, contour, and
attenuation. Similar appearing simple hepatic cyst in the left lobe
measuring approximately 1.3 cm. Additional subcentimeter
well-defined hypoechoic focus in segment [DATE], favored represent
simple hepatic cysts though too small to characterize by CT. Status
post cholecystectomy. No intra or extrahepatic biliary ductal
dilation.

Pancreas: Unremarkable. No pancreatic ductal dilatation or
surrounding inflammatory changes.

Spleen: Normal in size without focal abnormality.

Adrenals/Urinary Tract: Adrenal glands are unremarkable. Kidneys are
normal, without renal calculi, focal lesion, or hydronephrosis.
Bladder is unremarkable.

Stomach/Bowel: Stomach is within normal limits. Appendix appears
normal. Scattered transverse, descending, and sigmoid diverticula
without surrounding inflammatory changes. Postsurgical changes after
sigmoid colon colostomy without complicating features. No evidence
of bowel wall thickening, distention, or inflammatory changes.

Lymphatic: No abdominopelvic lymphadenopathy.

Reproductive: Prostate is unremarkable.

Other: Fat containing right inguinal hernia without complicating
features, unchanged. No ascites.

Musculoskeletal: No acute osseous abnormality. Similar appearing
postsurgical changes after L4-L5 spinous process fusion.
IMPRESSION: VASCULAR

No evidence of acute aortic syndrome. Mild scattered aortic
atherosclerosis ([R3]-[R3]).

NON-VASCULAR

1. No acute abdominopelvic abnormality.
2. Colonic diverticulosis, no evidence of diverticulitis.

## 2021-01-15 MED ORDER — AMOXICILLIN-POT CLAVULANATE 875-125 MG PO TABS
1.0000 | ORAL_TABLET | Freq: Once | ORAL | Status: AC
  Administered 2021-01-15: 1 via ORAL
  Filled 2021-01-15: qty 1

## 2021-01-15 MED ORDER — ONDANSETRON 8 MG PO TBDP: 8.0000 mg | ORAL_TABLET | Freq: Three times a day (TID) | ORAL | 0 refills | Status: DC | PRN

## 2021-01-15 MED ORDER — HYDROMORPHONE HCL 1 MG/ML IJ SOLN
1.0000 mg | INTRAMUSCULAR | Status: DC | PRN
  Administered 2021-01-15 (×2): 1 mg via INTRAVENOUS
  Filled 2021-01-15 (×2): qty 1

## 2021-01-15 MED ORDER — DROPERIDOL 2.5 MG/ML IJ SOLN
2.5000 mg | Freq: Once | INTRAMUSCULAR | Status: AC
  Administered 2021-01-15: 2.5 mg via INTRAVENOUS
  Filled 2021-01-15: qty 2

## 2021-01-15 MED ORDER — SODIUM CHLORIDE 0.9 % IV BOLUS
1000.0000 mL | Freq: Once | INTRAVENOUS | Status: AC
  Administered 2021-01-15: 1000 mL via INTRAVENOUS

## 2021-01-15 MED ORDER — ONDANSETRON 4 MG PO TBDP
4.0000 mg | ORAL_TABLET | Freq: Once | ORAL | Status: AC | PRN
  Administered 2021-01-15: 4 mg via ORAL
  Filled 2021-01-15: qty 1

## 2021-01-15 MED ORDER — IOHEXOL 350 MG/ML SOLN
100.0000 mL | Freq: Once | INTRAVENOUS | Status: AC | PRN
  Administered 2021-01-15: 100 mL via INTRAVENOUS

## 2021-01-15 MED ORDER — AMOXICILLIN-POT CLAVULANATE 875-125 MG PO TABS: 1.0000 | ORAL_TABLET | Freq: Two times a day (BID) | ORAL | 0 refills | Status: AC

## 2021-01-15 NOTE — ED Triage Notes (Signed)
Pt wife reporting pt in pain and needed pain medication. RN spoke with pt and offered an oxycodone and zofran. Pt states that he took morphine before he came and that didn't help so a percocet wont help.

## 2021-01-15 NOTE — ED Provider Notes (Signed)
[facility] Emergency Department Provider Note    Event Date/Time   First MD Initiated Contact with Patient 01/15/21 [phone]     (approximate)  I have reviewed the triage vital signs and the nursing notes.   HISTORY  Chief Complaint Abdominal Pain    HPI Mark Austin is a 40 y.o. male past medical history presents to the ER for evaluation of progressively worsening left lower quadrant abdominal pain now reports is severe associate with nausea has not been able to sleep since 3 AM as he awoke with severe pain denies any dysuria.  No hematuria.  Feels like it is worsening diverticular pain but had recent CT imaging that was negative.  Denies any new medications no recent stressors.  No trauma.  No pain in his back.  Past Medical History:  Diagnosis Date   Back ache    Diverticulitis    GERD (gastroesophageal reflux disease)    History of kidney stones    Hypertension    Inguinal hernia    Family History  Problem Relation Age of Onset   Healthy Mother    Healthy Father    Past Surgical History:  Procedure Laterality Date   back surgeries     x5 lumbar L4L5   BACK SURGERY     BOWEL RESECTION  2016   CHOLECYSTECTOMY  2017   COLON SURGERY     COLONOSCOPY     EXCISION MASS LOWER EXTREMETIES Left 11/30/2019   Procedure: EXCISION MASS LOWER EXTREMETIES, Left Hip;  Surgeon: Jules Husbands, MD;  Location: [facility] ORS;  Service: General;  Laterality: Left;   HERNIA REPAIR     INGUINAL HERNIA REPAIR Left    SHOULDER SURGERY Right    rotator cuff tear   XI ROBOTIC ASSISTED INGUINAL HERNIA REPAIR WITH MESH Right 10/08/2019   Procedure: XI ROBOTIC ASSISTED INGUINAL HERNIA REPAIR WITH MESH;  Surgeon: Jules Husbands, MD;  Location: [facility] ORS;  Service: General;  Laterality: Right;   There are no problems to display for this patient.     Prior to Admission medications   Medication Sig Start Date End Date Taking? Authorizing Provider  amoxicillin-clavulanate  (AUGMENTIN) 875-125 MG tablet Take 1 tablet by mouth 2 (two) times daily for 7 days. 01/15/21 01/22/21 Yes Merlyn Lot, MD  amLODipine (NORVASC) 10 MG tablet Take 10 mg by mouth daily.  09/16/19   [provider]  atorvastatin (LIPITOR) 10 MG tablet Take 10 mg by mouth at bedtime.  11/09/19   [provider]  chlorthalidone (HYGROTON) 25 MG tablet Take 25 mg by mouth daily.  11/08/19   [provider]  dicyclomine (BENTYL) 10 MG capsule Take 1 capsule (10 mg total) by mouth 4 (four) times daily for 14 days. 01/13/21 01/27/21  Lavonia Drafts, MD  HYDROcodone-acetaminophen (NORCO/VICODIN) 5-325 MG tablet Take 1 tablet by mouth every 6 (six) hours as needed for moderate pain. 11/30/19   Pabon, Diego F, MD  lansoprazole (PREVACID) 30 MG capsule Take 30 mg by mouth as needed.  10/20/19 11/24/19  [provider]  lisinopril (ZESTRIL) 40 MG tablet Take 40 mg by mouth daily.  07/07/19   [provider]  morphine (MSIR) 15 MG tablet Take 15 mg by mouth in the morning and at bedtime.    [provider]  ondansetron (ZOFRAN ODT) 8 MG disintegrating tablet Take 1 tablet (8 mg total) by mouth every 8 (eight) hours as needed for nausea or vomiting. 01/15/21   Quentin Cornwall,  Saralyn Pilar, MD  prazosin (MINIPRESS) 1 MG capsule Take 3 mg by mouth at bedtime.     [provider]  propranolol ER (INDERAL LA) 160 MG SR capsule Take 160 mg by mouth daily.    [provider]  tapentadol (NUCYNTA ER) 100 MG 12 hr tablet Take 100 mg by mouth in the morning and at bedtime.  07/06/10   [provider]  testosterone cypionate (DEPOTESTOTERONE CYPIONATE) 100 MG/ML injection Inject 80 mg into the muscle every 7 (seven) days. For IM use only  Thursday's    [provider]  varenicline (CHANTIX PAK) 0.5 MG X 11 & 1 MG X 42 tablet Take 0.5 mg by mouth 2 (two) times daily.     [provider]    Allergies Atorvastatin, Brassica oleracea, Carvedilol,  Gabapentin, Ketorolac tromethamine, Lidocaine, Tramadol, and Carisoprodol    Social History Social History   Tobacco Use   Smoking status: Former    Packs/day: 0.75    Pack years: 0.00    Types: Cigarettes    Quit date: 11/23/2019    Years since quitting: 1.1   Smokeless tobacco: Never  Vaping Use   Vaping Use: Never used  Substance Use Topics   Alcohol use: Never   Drug use: Not Currently    Review of Systems Patient denies headaches, rhinorrhea, blurry vision, numbness, shortness of breath, chest pain, edema, cough, abdominal pain, nausea, vomiting, diarrhea, dysuria, fevers, rashes or hallucinations unless otherwise stated above in HPI. ____________________________________________   PHYSICAL EXAM:  VITAL SIGNS: Vitals:   01/15/21 1022 01/15/21 1100  BP: (!) 200/127 (!) 176/113  Pulse: 74 74  Resp: 14 13  Temp:    SpO2: 96% 95%    Constitutional: Alert and oriented.  Eyes: Conjunctivae are normal.  Head: Atraumatic. Nose: No congestion/rhinnorhea. Mouth/Throat: Mucous membranes are moist.   Neck: No stridor. Painless ROM.  Cardiovascular: Normal rate, regular rhythm. Grossly normal heart sounds.  Good peripheral circulation. Respiratory: Normal respiratory effort.  No retractions. Lungs CTAB. Gastrointestinal: Soft with ttp in llq, no overlying rash, erythema or warmth. No distention. No abdominal bruits. No CVA tenderness. Genitourinary:  Musculoskeletal: No lower extremity tenderness nor edema.  No joint effusions. Neurologic:  Normal speech and language. No gross focal neurologic deficits are appreciated. No facial droop Skin:  Skin is warm, dry and intact. No rash noted. Psychiatric: Mood and affect are normal. Speech and behavior are normal.  ____________________________________________   LABS (all labs ordered are listed, but only abnormal results are displayed)  Results for orders placed or performed during the hospital encounter of 01/15/21 (from  the past 24 hour(s))  Lipase, blood     Status: Abnormal   Collection Time: 01/15/21  5:54 AM  Result Value Ref Range   Lipase 93 (H) 11 - 51 U/L  Comprehensive metabolic panel     Status: Abnormal   Collection Time: 01/15/21  5:54 AM  Result Value Ref Range   Sodium 136 135 - 145 mmol/L   Potassium 4.2 3.5 - 5.1 mmol/L   Chloride 100 98 - 111 mmol/L   CO2 30 22 - 32 mmol/L   Glucose, Bld 103 (H) 70 - 99 mg/dL   BUN 11 6 - 20 mg/dL   Creatinine, Ser 0.93 0.61 - 1.24 mg/dL   Calcium 9.3 8.9 - 10.3 mg/dL   Total Protein 8.0 6.5 - 8.1 g/dL   Albumin 4.4 3.5 - 5.0 g/dL   AST 29 15 - 41 U/L  ALT 21 0 - 44 U/L   Alkaline Phosphatase 57 38 - 126 U/L   Total Bilirubin 1.0 0.3 - 1.2 mg/dL   GFR, Estimated >60 >60 mL/min   Anion gap 6 5 - 15  CBC     Status: Abnormal   Collection Time: 01/15/21  5:54 AM  Result Value Ref Range   WBC 9.9 4.0 - 10.5 K/uL   RBC 6.04 (H) 4.22 - 5.81 MIL/uL   Hemoglobin 19.0 (H) 13.0 - 17.0 g/dL   HCT 54.3 (H) 39.0 - 52.0 %   MCV 89.9 80.0 - 100.0 fL   MCH 31.5 26.0 - 34.0 pg   MCHC 35.0 30.0 - 36.0 g/dL   RDW 12.4 11.5 - 15.5 %   Platelets 188 150 - 400 K/uL   nRBC 0.0 0.0 - 0.2 %  Urinalysis, Complete w Microscopic Urine, Clean Catch     Status: Abnormal   Collection Time: 01/15/21 11:59 AM  Result Value Ref Range   Color, Urine YELLOW (A) YELLOW   APPearance CLEAR (A) CLEAR   Specific Gravity, Urine 1.029 1.005 - 1.030   pH 5.0 5.0 - 8.0   Glucose, UA NEGATIVE NEGATIVE mg/dL   Hgb urine dipstick NEGATIVE NEGATIVE   Bilirubin Urine NEGATIVE NEGATIVE   Ketones, ur NEGATIVE NEGATIVE mg/dL   Protein, ur NEGATIVE NEGATIVE mg/dL   Nitrite NEGATIVE NEGATIVE   Leukocytes,Ua NEGATIVE NEGATIVE   RBC / HPF 0-5 0 - 5 RBC/hpf   WBC, UA 0-5 0 - 5 WBC/hpf   Bacteria, UA NONE SEEN NONE SEEN   Squamous Epithelial / LPF 0-5 0 - 5   Mucus PRESENT    ____________________________________________  EKG My review and personal interpretation at Time: 10:25    Indication: abd pain  Rate: 70  Rhythm: sinus Axis: normal Other: normal intervals, no stemi ____________________________________________  RADIOLOGY  I personally reviewed all radiographic images ordered to evaluate for the above acute complaints and reviewed radiology reports and findings.  These findings were personally discussed with the patient.  Please see medical record for radiology report.  ____________________________________________   PROCEDURES  Procedure(s) performed:  Procedures    Critical Care performed: no ____________________________________________   INITIAL IMPRESSION / ASSESSMENT AND PLAN / ED COURSE  Pertinent labs & imaging results that were available during my care of the patient were reviewed by me and considered in my medical decision making (see chart for details).   DDX: Diverticulitis, mesenteric ischemia, dissection, AAA, musculoskeletal strain, shingles, constipation, stone, UTI  Martin Belling is a 40 y.o. who presents to the ED with presentation as described above.  Patient uncomfortable appearing hypertensive with persistent worsening left lower quadrant pain with a history of diverticulitis.  CT imaging from few days ago was unremarkable.  Had a leukocytosis then that is since improved.  Patient given his pain however will order CT imaging to further evaluate for the but differential.  Will give IV fluids as well as IV pain medication.  Clinical Course as of 01/15/21 1239  Mon Jan 15, 2021  1136 Patient reassessed.  Blood pressure improved.  Still having persistent pain.  CT imaging without clear explanation for his discomfort no white count.  No fever.  States that it feels like his diverticulitis previously would like to try a course of antibiotics which I think is reasonable given his history.  Will trial p.o. antibiotics states that he is feeling some nausea will trial droperidol and reassess. [PR]  1236 Patient reassessed.  Feeling well.  Tolerating p.o.  Repeat abdominal exam without guarding or rebound.  States he feels comfortable with outpatient follow-up will give referral to GI.  Discussed strict return precautions. [PR]    Clinical Course User Index [PR] Merlyn Lot, MD    The patient was evaluated in Emergency Department today for the symptoms described in the history of present illness. He/she was evaluated in the context of the global COVID-19 pandemic, which necessitated consideration that the patient might be at risk for infection with the SARS-CoV-2 virus that causes COVID-19. Institutional protocols and algorithms that pertain to the evaluation of patients at risk for COVID-19 are in a state of rapid change based on information released by regulatory bodies including the CDC and federal and state organizations. These policies and algorithms were followed during the patient's care in the ED.  As part of my medical decision making, I reviewed the following data within the [place] notes reviewed and incorporated, Labs reviewed, notes from prior ED visits and [place] Controlled Substance Database   ____________________________________________   FINAL CLINICAL IMPRESSION(S) / ED DIAGNOSES  Final diagnoses:  Left lower quadrant abdominal pain      NEW MEDICATIONS STARTED DURING THIS VISIT:  New Prescriptions   AMOXICILLIN-CLAVULANATE (AUGMENTIN) 875-125 MG TABLET    Take 1 tablet by mouth 2 (two) times daily for 7 days.     Note:  This document was prepared using Dragon voice recognition software and may include unintentional dictation errors.    Merlyn Lot, MD 01/15/21 1239

## 2021-01-15 NOTE — ED Triage Notes (Signed)
Pt arrived via POV with reports of LLQ abd pain, diarrhea and nausea x 2 weeks, pt states he now started vomiting and worsening pain.  Pt has hx of diverticulitis, feels the same.

## 2021-01-15 NOTE — ED Notes (Signed)
Pt to CT

## 2021-01-15 NOTE — ED Notes (Addendum)
Pt c/o LLQ abdominal pain, sharp, 8-9/10 over last couple weeks that has been worse past couple days. Hx diverticulitis. EDP has seen pt.

## 2021-01-15 NOTE — Discharge Instructions (Addendum)

## 2021-03-07 ENCOUNTER — Other Ambulatory Visit: Payer: Self-pay

## 2021-03-07 DIAGNOSIS — I10 Essential (primary) hypertension: Secondary | ICD-10-CM | POA: Insufficient documentation

## 2021-03-07 DIAGNOSIS — G8929 Other chronic pain: Secondary | ICD-10-CM | POA: Insufficient documentation

## 2021-03-07 DIAGNOSIS — G4730 Sleep apnea, unspecified: Secondary | ICD-10-CM | POA: Insufficient documentation

## 2021-03-07 DIAGNOSIS — G43909 Migraine, unspecified, not intractable, without status migrainosus: Secondary | ICD-10-CM | POA: Insufficient documentation

## 2021-03-07 DIAGNOSIS — IMO0001 Reserved for inherently not codable concepts without codable children: Secondary | ICD-10-CM | POA: Insufficient documentation

## 2021-03-07 DIAGNOSIS — F411 Generalized anxiety disorder: Secondary | ICD-10-CM | POA: Insufficient documentation

## 2021-03-07 DIAGNOSIS — H9191 Unspecified hearing loss, right ear: Secondary | ICD-10-CM | POA: Insufficient documentation

## 2021-03-07 DIAGNOSIS — G4733 Obstructive sleep apnea (adult) (pediatric): Secondary | ICD-10-CM | POA: Insufficient documentation

## 2021-03-08 ENCOUNTER — Other Ambulatory Visit: Payer: Self-pay

## 2021-03-08 ENCOUNTER — Encounter: Payer: Self-pay | Admitting: Gastroenterology

## 2021-03-08 ENCOUNTER — Ambulatory Visit (INDEPENDENT_AMBULATORY_CARE_PROVIDER_SITE_OTHER): Payer: Managed Care, Other (non HMO) | Admitting: Gastroenterology

## 2021-03-08 VITALS — BP 125/86 | HR 85 | Temp 97.5°F | Ht 71.0 in | Wt 214.2 lb

## 2021-03-08 DIAGNOSIS — R1032 Left lower quadrant pain: Secondary | ICD-10-CM | POA: Diagnosis not present

## 2021-03-08 MED ORDER — CLENPIQ 10-3.5-12 MG-GM -GM/160ML PO SOLN: 320.0000 mL | ORAL | 0 refills | Status: DC

## 2021-03-08 NOTE — Progress Notes (Signed)
Gastroenterology Consultation  Referring Provider:     [place] Physician:  [facility] Primary Gastroenterologist:  Dr. Allen Norris     Reason for Consultation:     Left-sided abdominal pain        HPI:   Mark Austin is a 40 y.o. y/o male referred for consultation & management of Left-sided abdominal pain by Dr. Domingo Madeira, [facility].  This patient comes in today with a report of having diverticulitis as early as the age of 30 with surgery at that time.  The patient has had multiple bouts with multiple admissions to the hospital for diverticulitis.  The patient started to get worsening abdominal pains for the last 2 years.  His most recent trip to the emergency room did not show any sign of diverticulitis.  He also reports his last colonoscopy was 6 years ago and at that time he states that the physician told him he had a colonoscopy that looked like an old colon.  He denies any fevers chills nausea or vomiting at the present time.  The patient states that his abdominal pain is so bad that he has to go to the emergency room and it can last from a couple of hours to a couple of days.  He also reports that he has lost a total of 60 pounds without trying. He denies any early satiety black stools or bloody stools.  Past Medical History:  Diagnosis Date   Back ache    Diverticulitis    GERD (gastroesophageal reflux disease)    History of kidney stones    Hypertension    Inguinal hernia     Past Surgical History:  Procedure Laterality Date   back surgeries     x5 lumbar L4L5   BACK SURGERY     BOWEL RESECTION  2016   CHOLECYSTECTOMY  2017   COLON SURGERY     COLONOSCOPY     EXCISION MASS LOWER EXTREMETIES Left 11/30/2019   Procedure: EXCISION MASS LOWER EXTREMETIES, Left Hip;  Surgeon: Jules Husbands, MD;  Location: [facility] ORS;  Service: General;  Laterality: Left;   HERNIA REPAIR     INGUINAL HERNIA REPAIR Left    SHOULDER SURGERY Right     rotator cuff tear   XI ROBOTIC ASSISTED INGUINAL HERNIA REPAIR WITH MESH Right 10/08/2019   Procedure: XI ROBOTIC ASSISTED INGUINAL HERNIA REPAIR WITH MESH;  Surgeon: Jules Husbands, MD;  Location: [facility] ORS;  Service: General;  Laterality: Right;    Prior to Admission medications   Medication Sig Start Date End Date Taking? Authorizing Provider  amLODipine (NORVASC) 10 MG tablet Take 10 mg by mouth daily.  09/16/19   [provider]  atorvastatin (LIPITOR) 10 MG tablet Take 10 mg by mouth at bedtime.  11/09/19   [provider]  busPIRone (BUSPAR) 15 MG tablet SMARTSIG:0.25-1 Tablet(s) By Mouth 1-3 Times Daily PRN 10/31/20   [provider]  chlorthalidone (HYGROTON) 25 MG tablet Take 25 mg by mouth daily.  11/08/19   [provider]  cyclobenzaprine (FLEXERIL) 10 MG tablet Take 10 mg by mouth 3 (three) times daily. 11/10/20   [provider]  dicyclomine (BENTYL) 10 MG capsule Take 1 capsule (10 mg total) by mouth 4 (four) times daily for 14 days. 01/13/21 01/27/21  Lavonia Drafts, MD  HYDROcodone-acetaminophen (NORCO/VICODIN) 5-325 MG tablet Take 1 tablet by mouth every 6 (six) hours as needed for moderate pain. 11/30/19  Pabon, Diego F, MD  lansoprazole (PREVACID) 30 MG capsule Take 30 mg by mouth as needed.  10/20/19 11/24/19  [provider]  lisinopril (ZESTRIL) 40 MG tablet Take 40 mg by mouth daily.  07/07/19   [provider]  methylphenidate (RITALIN LA) 30 MG 24 hr capsule Take 30 mg by mouth every morning. 01/08/21   [provider]  morphine (MSIR) 15 MG tablet Take 15 mg by mouth in the morning and at bedtime.    [provider]  ondansetron (ZOFRAN ODT) 8 MG disintegrating tablet Take 1 tablet (8 mg total) by mouth every 8 (eight) hours as needed for nausea or vomiting. 01/15/21   Merlyn Lot, MD  prazosin (MINIPRESS) 1 MG capsule Take 3 mg by mouth at bedtime.     [provider]  propranolol ER  (INDERAL LA) 160 MG SR capsule Take 160 mg by mouth daily.    [provider]  tapentadol (NUCYNTA ER) 100 MG 12 hr tablet Take 100 mg by mouth in the morning and at bedtime.  07/06/10   [provider]  testosterone cypionate (DEPOTESTOTERONE CYPIONATE) 100 MG/ML injection Inject 80 mg into the muscle every 7 (seven) days. For IM use only  Thursday's    [provider]  varenicline (CHANTIX PAK) 0.5 MG X 11 & 1 MG X 42 tablet Take 0.5 mg by mouth 2 (two) times daily.     [provider]    Family History  Problem Relation Age of Onset   Healthy Mother    Healthy Father      Social History   Tobacco Use   Smoking status: Former    Packs/day: 0.75    Types: Cigarettes    Quit date: 11/23/2019    Years since quitting: 1.2   Smokeless tobacco: Never  Vaping Use   Vaping Use: Never used  Substance Use Topics   Alcohol use: Never   Drug use: Not Currently    Allergies as of 03/08/2021 - Review Complete 01/15/2021  Allergen Reaction Noted   Atorvastatin  06/12/2020   Brassica oleracea Hives 02/03/2016   Carvedilol  12/07/2018   Gabapentin  02/05/2016   Ketorolac tromethamine Other (See Comments) and Swelling 01/26/2009   Lidocaine  08/06/2016   Tramadol Other (See Comments) 09/04/2015   Carisoprodol Rash 05/03/2009    Review of Systems:    All systems reviewed and negative except where noted in HPI.   Physical Exam:  There were no vitals taken for this visit. No LMP for male patient. General:   Alert,  Well-developed, well-nourished, pleasant and cooperative in NAD Head:  Normocephalic and atraumatic. Eyes:  Sclera clear, no icterus.   Conjunctiva pink. Ears:  Normal auditory acuity. Neck:  Supple; no masses or thyromegaly. Lungs:  Respirations even and unlabored.  Clear throughout to auscultation.   No wheezes, crackles, or rhonchi. No acute distress. Heart:  Regular rate and rhythm; no murmurs, clicks, rubs, or gallops. Abdomen:   Normal bowel sounds.  No bruits.  Soft, non-tender and non-distended without masses, hepatosplenomegaly or hernias noted.  No guarding or rebound tenderness.  Negative Carnett sign.   Rectal:  Deferred.  Pulses:  Normal pulses noted. Extremities:  No clubbing or edema.  No cyanosis. Neurologic:  Alert and oriented x3;  grossly normal neurologically. Skin:  Intact without significant lesions or rashes.  No jaundice. Lymph Nodes:  No significant cervical adenopathy. Psych:  Alert and cooperative. Normal mood and affect.  Imaging Studies: No  results found.  Assessment and Plan:   Mark Austin is a 40 y.o. y/o male who comes in today with bouts of significant left lower quadrant pain with a history of diverticulitis but this most recent CT scan not being consistent with diverticulitis. The patient has chronic back pain and has been told to try and left his legs up when he is having the pain to see if this pain is musculoskeletal since he does have spasmodic pains in his back at times. The patient has also been told that since his weight loss has been so significant and his pain is in the left lower quadrant he should be set up for a colonoscopy.  The patient will be set up for colonoscopy and follow at that time.  The patient has been explained the plan and agrees with it.    Lucilla Lame, MD. Marval Regal    Note: This dictation was prepared with Dragon dictation along with smaller phrase technology. Any transcriptional errors that result from this process are unintentional.

## 2021-03-08 NOTE — H&P (View-Only) (Signed)
Gastroenterology Consultation  Referring Provider:     [place] Physician:  Center, [facility] Primary Gastroenterologist:  Dr. Allen Norris     Reason for Consultation:     Left-sided abdominal pain        HPI:   Mark Austin is a 40 y.o. y/o male referred for consultation & management of Left-sided abdominal pain by Dr. Domingo Madeira, [facility].  This patient comes in today with a report of having diverticulitis as early as the age of 39 with surgery at that time.  The patient has had multiple bouts with multiple admissions to the hospital for diverticulitis.  The patient started to get worsening abdominal pains for the last 2 years.  His most recent trip to the emergency room did not show any sign of diverticulitis.  He also reports his last colonoscopy was 6 years ago and at that time he states that the physician told him he had a colonoscopy that looked like an old colon.  He denies any fevers chills nausea or vomiting at the present time.  The patient states that his abdominal pain is so bad that he has to go to the emergency room and it can last from a couple of hours to a couple of days.  He also reports that he has lost a total of 60 pounds without trying. He denies any early satiety black stools or bloody stools.  Past Medical History:  Diagnosis Date   Back ache    Diverticulitis    GERD (gastroesophageal reflux disease)    History of kidney stones    Hypertension    Inguinal hernia     Past Surgical History:  Procedure Laterality Date   back surgeries     x5 lumbar L4L5   BACK SURGERY     BOWEL RESECTION  2016   CHOLECYSTECTOMY  2017   COLON SURGERY     COLONOSCOPY     EXCISION MASS LOWER EXTREMETIES Left 11/30/2019   Procedure: EXCISION MASS LOWER EXTREMETIES, Left Hip;  Surgeon: Jules Husbands, MD;  Location: [facility] ORS;  Service: General;  Laterality: Left;   HERNIA REPAIR     INGUINAL HERNIA REPAIR Left    SHOULDER SURGERY Right     rotator cuff tear   XI ROBOTIC ASSISTED INGUINAL HERNIA REPAIR WITH MESH Right 10/08/2019   Procedure: XI ROBOTIC ASSISTED INGUINAL HERNIA REPAIR WITH MESH;  Surgeon: Jules Husbands, MD;  Location: [facility] ORS;  Service: General;  Laterality: Right;    Prior to Admission medications   Medication Sig Start Date End Date Taking? Authorizing Provider  amLODipine (NORVASC) 10 MG tablet Take 10 mg by mouth daily.  09/16/19   [provider]  atorvastatin (LIPITOR) 10 MG tablet Take 10 mg by mouth at bedtime.  11/09/19   [provider]  busPIRone (BUSPAR) 15 MG tablet SMARTSIG:0.25-1 Tablet(s) By Mouth 1-3 Times Daily PRN 10/31/20   [provider]  chlorthalidone (HYGROTON) 25 MG tablet Take 25 mg by mouth daily.  11/08/19   [provider]  cyclobenzaprine (FLEXERIL) 10 MG tablet Take 10 mg by mouth 3 (three) times daily. 11/10/20   [provider]  dicyclomine (BENTYL) 10 MG capsule Take 1 capsule (10 mg total) by mouth 4 (four) times daily for 14 days. 01/13/21 01/27/21  Lavonia Drafts, MD  HYDROcodone-acetaminophen (NORCO/VICODIN) 5-325 MG tablet Take 1 tablet by mouth every 6 (six) hours as needed for moderate pain. 11/30/19  Pabon, Diego F, MD  lansoprazole (PREVACID) 30 MG capsule Take 30 mg by mouth as needed.  10/20/19 11/24/19  [provider]  lisinopril (ZESTRIL) 40 MG tablet Take 40 mg by mouth daily.  07/07/19   [provider]  methylphenidate (RITALIN LA) 30 MG 24 hr capsule Take 30 mg by mouth every morning. 01/08/21   [provider]  morphine (MSIR) 15 MG tablet Take 15 mg by mouth in the morning and at bedtime.    [provider]  ondansetron (ZOFRAN ODT) 8 MG disintegrating tablet Take 1 tablet (8 mg total) by mouth every 8 (eight) hours as needed for nausea or vomiting. 01/15/21   Merlyn Lot, MD  prazosin (MINIPRESS) 1 MG capsule Take 3 mg by mouth at bedtime.     [provider]  propranolol ER  (INDERAL LA) 160 MG SR capsule Take 160 mg by mouth daily.    [provider]  tapentadol (NUCYNTA ER) 100 MG 12 hr tablet Take 100 mg by mouth in the morning and at bedtime.  07/06/10   [provider]  testosterone cypionate (DEPOTESTOTERONE CYPIONATE) 100 MG/ML injection Inject 80 mg into the muscle every 7 (seven) days. For IM use only  Thursday's    [provider]  varenicline (CHANTIX PAK) 0.5 MG X 11 & 1 MG X 42 tablet Take 0.5 mg by mouth 2 (two) times daily.     [provider]    Family History  Problem Relation Age of Onset   Healthy Mother    Healthy Father      Social History   Tobacco Use   Smoking status: Former    Packs/day: 0.75    Types: Cigarettes    Quit date: 11/23/2019    Years since quitting: 1.2   Smokeless tobacco: Never  Vaping Use   Vaping Use: Never used  Substance Use Topics   Alcohol use: Never   Drug use: Not Currently    Allergies as of 03/08/2021 - Review Complete 01/15/2021  Allergen Reaction Noted   Atorvastatin  06/12/2020   Brassica oleracea Hives 02/03/2016   Carvedilol  12/07/2018   Gabapentin  02/05/2016   Ketorolac tromethamine Other (See Comments) and Swelling 01/26/2009   Lidocaine  08/06/2016   Tramadol Other (See Comments) 09/04/2015   Carisoprodol Rash 05/03/2009    Review of Systems:    All systems reviewed and negative except where noted in HPI.   Physical Exam:  There were no vitals taken for this visit. No LMP for male patient. General:   Alert,  Well-developed, well-nourished, pleasant and cooperative in NAD Head:  Normocephalic and atraumatic. Eyes:  Sclera clear, no icterus.   Conjunctiva pink. Ears:  Normal auditory acuity. Neck:  Supple; no masses or thyromegaly. Lungs:  Respirations even and unlabored.  Clear throughout to auscultation.   No wheezes, crackles, or rhonchi. No acute distress. Heart:  Regular rate and rhythm; no murmurs, clicks, rubs, or gallops. Abdomen:   Normal bowel sounds.  No bruits.  Soft, non-tender and non-distended without masses, hepatosplenomegaly or hernias noted.  No guarding or rebound tenderness.  Negative Carnett sign.   Rectal:  Deferred.  Pulses:  Normal pulses noted. Extremities:  No clubbing or edema.  No cyanosis. Neurologic:  Alert and oriented x3;  grossly normal neurologically. Skin:  Intact without significant lesions or rashes.  No jaundice. Lymph Nodes:  No significant cervical adenopathy. Psych:  Alert and cooperative. Normal mood and affect.  Imaging Studies: No  results found.  Assessment and Plan:   Jayse Obst is a 40 y.o. y/o male who comes in today with bouts of significant left lower quadrant pain with a history of diverticulitis but this most recent CT scan not being consistent with diverticulitis. The patient has chronic back pain and has been told to try and left his legs up when he is having the pain to see if this pain is musculoskeletal since he does have spasmodic pains in his back at times. The patient has also been told that since his weight loss has been so significant and his pain is in the left lower quadrant he should be set up for a colonoscopy.  The patient will be set up for colonoscopy and follow at that time.  The patient has been explained the plan and agrees with it.    Lucilla Lame, MD. Marval Regal    Note: This dictation was prepared with Dragon dictation along with smaller phrase technology. Any transcriptional errors that result from this process are unintentional.

## 2021-03-13 ENCOUNTER — Encounter: Payer: Self-pay | Admitting: Gastroenterology

## 2021-03-19 ENCOUNTER — Encounter
Admission: RE | Disposition: A | Discharge status: Home / Self Care | Payer: Self-pay | Source: Home / Self Care | Attending: Gastroenterology

## 2021-03-19 ENCOUNTER — Other Ambulatory Visit: Payer: Self-pay

## 2021-03-19 ENCOUNTER — Ambulatory Visit
Admission: RE | Admit: 2021-03-19 | Discharge: 2021-03-19 | Disposition: A | Discharge status: Home / Self Care | Payer: Managed Care, Other (non HMO) | Attending: Gastroenterology | Admitting: Gastroenterology

## 2021-03-19 ENCOUNTER — Ambulatory Visit: Payer: Managed Care, Other (non HMO) | Admitting: Anesthesiology

## 2021-03-19 DIAGNOSIS — D125 Benign neoplasm of sigmoid colon: Secondary | ICD-10-CM | POA: Insufficient documentation

## 2021-03-19 DIAGNOSIS — K635 Polyp of colon: Secondary | ICD-10-CM

## 2021-03-19 DIAGNOSIS — G8929 Other chronic pain: Secondary | ICD-10-CM | POA: Diagnosis not present

## 2021-03-19 DIAGNOSIS — R1032 Left lower quadrant pain: Secondary | ICD-10-CM | POA: Diagnosis present

## 2021-03-19 DIAGNOSIS — Z888 Allergy status to other drugs, medicaments and biological substances status: Secondary | ICD-10-CM | POA: Diagnosis not present

## 2021-03-19 DIAGNOSIS — Z980 Intestinal bypass and anastomosis status: Secondary | ICD-10-CM | POA: Insufficient documentation

## 2021-03-19 DIAGNOSIS — K5730 Diverticulosis of large intestine without perforation or abscess without bleeding: Secondary | ICD-10-CM | POA: Diagnosis not present

## 2021-03-19 DIAGNOSIS — Z79891 Long term (current) use of opiate analgesic: Secondary | ICD-10-CM | POA: Insufficient documentation

## 2021-03-19 DIAGNOSIS — Z8719 Personal history of other diseases of the digestive system: Secondary | ICD-10-CM | POA: Diagnosis not present

## 2021-03-19 DIAGNOSIS — Z884 Allergy status to anesthetic agent status: Secondary | ICD-10-CM | POA: Insufficient documentation

## 2021-03-19 DIAGNOSIS — Z6828 Body mass index (BMI) 28.0-28.9, adult: Secondary | ICD-10-CM | POA: Insufficient documentation

## 2021-03-19 DIAGNOSIS — Z79899 Other long term (current) drug therapy: Secondary | ICD-10-CM | POA: Insufficient documentation

## 2021-03-19 DIAGNOSIS — D122 Benign neoplasm of ascending colon: Secondary | ICD-10-CM | POA: Diagnosis not present

## 2021-03-19 DIAGNOSIS — R634 Abnormal weight loss: Secondary | ICD-10-CM | POA: Diagnosis not present

## 2021-03-19 DIAGNOSIS — K5732 Diverticulitis of large intestine without perforation or abscess without bleeding: Secondary | ICD-10-CM

## 2021-03-19 DIAGNOSIS — Z885 Allergy status to narcotic agent status: Secondary | ICD-10-CM | POA: Insufficient documentation

## 2021-03-19 DIAGNOSIS — M549 Dorsalgia, unspecified: Secondary | ICD-10-CM | POA: Diagnosis not present

## 2021-03-19 DIAGNOSIS — Z87891 Personal history of nicotine dependence: Secondary | ICD-10-CM | POA: Insufficient documentation

## 2021-03-19 DIAGNOSIS — Z79890 Hormone replacement therapy (postmenopausal): Secondary | ICD-10-CM | POA: Diagnosis not present

## 2021-03-19 HISTORY — PX: POLYPECTOMY: SHX5525

## 2021-03-19 HISTORY — DX: Unspecified convulsions: R56.9

## 2021-03-19 HISTORY — PX: COLONOSCOPY WITH PROPOFOL: SHX5780

## 2021-03-19 SURGERY — COLONOSCOPY WITH PROPOFOL
Anesthesia: General | Site: Rectum

## 2021-03-19 MED ORDER — LIDOCAINE HCL (CARDIAC) PF 100 MG/5ML IV SOSY
PREFILLED_SYRINGE | INTRAVENOUS | Status: DC | PRN
  Administered 2021-03-19: 50 mg via INTRAVENOUS

## 2021-03-19 MED ORDER — PROPOFOL 10 MG/ML IV BOLUS
INTRAVENOUS | Status: DC | PRN
  Administered 2021-03-19: 50 mg via INTRAVENOUS
  Administered 2021-03-19: 60 mg via INTRAVENOUS
  Administered 2021-03-19: 100 mg via INTRAVENOUS
  Administered 2021-03-19: 60 mg via INTRAVENOUS
  Administered 2021-03-19 (×2): 50 mg via INTRAVENOUS
  Administered 2021-03-19: 40 mg via INTRAVENOUS
  Administered 2021-03-19: 50 mg via INTRAVENOUS
  Administered 2021-03-19: 40 mg via INTRAVENOUS
  Administered 2021-03-19: 50 mg via INTRAVENOUS

## 2021-03-19 MED ORDER — LACTATED RINGERS IV SOLN: INTRAVENOUS | Status: DC

## 2021-03-19 MED ORDER — STERILE WATER FOR IRRIGATION IR SOLN: Status: DC | PRN

## 2021-03-19 MED ORDER — SODIUM CHLORIDE 0.9 % IV SOLN: INTRAVENOUS | Status: DC

## 2021-03-19 SURGICAL SUPPLY — 8 items
GOWN CVR UNV OPN BCK APRN NK (MISCELLANEOUS) ×2 IMPLANT
GOWN ISOL THUMB LOOP REG UNIV (MISCELLANEOUS) ×4
KIT PRC NS LF DISP ENDO (KITS) ×1 IMPLANT
KIT PROCEDURE OLYMPUS (KITS) ×2
MANIFOLD NEPTUNE II (INSTRUMENTS) ×2 IMPLANT
SNARE COLD EXACTO (MISCELLANEOUS) ×2 IMPLANT
TRAP ETRAP POLY (MISCELLANEOUS) ×2 IMPLANT
WATER STERILE IRR 250ML POUR (IV SOLUTION) ×2 IMPLANT

## 2021-03-19 NOTE — Op Note (Signed)
[facility] Gastroenterology Patient Name: Mark Austin Procedure Date: 03/19/2021 10:51 AM MRN: [id number] Account #: [id number] Date of Birth: 09-15-1980 Admit Type: Outpatient Age: 40 Room: [facility] OR ROOM 01 Gender: Male Note Status: Finalized Procedure:             Colonoscopy Indications:           Follow-up of diverticulitis Providers:             Lucilla Lame MD, MD Referring MD:          [facility], MD (Referring MD) Medicines:             Propofol per Anesthesia Complications:         No immediate complications. Procedure:             Pre-Anesthesia Assessment:                        - Prior to the procedure, a History and Physical was                         performed, and patient medications and allergies were                         reviewed. The patient's tolerance of previous                         anesthesia was also reviewed. The risks and benefits                         of the procedure and the sedation options and risks                         were discussed with the patient. All questions were                         answered, and informed consent was obtained. Prior                         Anticoagulants: The patient has taken no previous                         anticoagulant or antiplatelet agents. ASA Grade                         Assessment: II - A patient with mild systemic disease.                         After reviewing the risks and benefits, the patient                         was deemed in satisfactory condition to undergo the                         procedure.                        After obtaining informed consent, the colonoscope was  passed under direct vision. Throughout the procedure,                         the patient's blood pressure, pulse, and oxygen                         saturations were monitored continuously. The was                         introduced through the anus and advanced to  the the                         cecum, identified by appendiceal orifice and ileocecal                         valve. The colonoscopy was performed without                         difficulty. The patient tolerated the procedure well.                         The quality of the bowel preparation was excellent. Findings:      The perianal and digital rectal examinations were normal.      A 5 mm polyp was found in the ascending colon. The polyp was sessile.       The polyp was removed with a cold snare. Resection and retrieval were       complete.      Two sessile polyps were found in the sigmoid colon. The polyps were 3 to       6 mm in size. These polyps were removed with a cold snare. Resection and       retrieval were complete.      Small-mouthed diverticula were found in the sigmoid colon and descending       colon.      There was evidence of a prior end-to-side colo-colonic anastomosis in       the sigmoid colon. This was patent. Impression:            - One 5 mm polyp in the ascending colon, removed with                         a cold snare. Resected and retrieved.                        - Two 3 to 6 mm polyps in the sigmoid colon, removed                         with a cold snare. Resected and retrieved.                        - Diverticulosis in the sigmoid colon.                        - Patent end-to-side colo-colonic anastomosis. Recommendation:        - Discharge patient to home.                        - Resume previous diet.                        -  Continue present medications.                        - Await pathology results.                        - Repeat colonoscopy in 5 years for surveillance if                         adenomatous otherwise 10 years. Procedure Code(s):     --- Professional ---                        [phone], Colonoscopy, flexible; with removal of                         tumor(s), polyp(s), or other lesion(s) by snare                          technique Diagnosis Code(s):     --- Professional ---                        [id number], Diverticulitis of large intestine without                         perforation or abscess without bleeding                        K63.5, Polyp of colon CPT copyright 2019 American Medical Association. All rights reserved. The codes documented in this report are preliminary and upon coder review may  be revised to meet current compliance requirements. Lucilla Lame MD, MD 03/19/2021 11:20:03 AM This report has been signed electronically. Number of Addenda: 0 Note Initiated On: 03/19/2021 10:51 AM Scope Withdrawal Time: 0 hours 8 minutes 42 seconds  Total Procedure Duration: 0 hours 12 minutes 5 seconds  Estimated Blood Loss:  Estimated blood loss: none.      [facility]

## 2021-03-19 NOTE — Interval H&P Note (Signed)
Lucilla Lame, MD [facility] [address], [place], [place] [address] Phone:[phone] Fax : [phone]  Primary Care Physician:  [facility], [facility] Primary Gastroenterologist:  Dr. Allen Norris  Pre-Procedure History & Physical: HPI:  Mark Austin is a 40 y.o. male is here for an colonoscopy.   Past Medical History:  Diagnosis Date   Back ache    Diverticulitis    GERD (gastroesophageal reflux disease)    History of kidney stones    Hypertension    Inguinal hernia    Seizure ([place])    over 20 yrs ago.  related to medications    Past Surgical History:  Procedure Laterality Date   back surgeries     x5 lumbar L4L5   BACK SURGERY     BOWEL RESECTION  2016   CHOLECYSTECTOMY  2017   COLON SURGERY     COLONOSCOPY     EXCISION MASS LOWER EXTREMETIES Left 11/30/2019   Procedure: EXCISION MASS LOWER EXTREMETIES, Left Hip;  Surgeon: Jules Husbands, MD;  Location: [facility] ORS;  Service: General;  Laterality: Left;   HERNIA REPAIR     INGUINAL HERNIA REPAIR Left    SHOULDER SURGERY Right    rotator cuff tear   XI ROBOTIC ASSISTED INGUINAL HERNIA REPAIR WITH MESH Right 10/08/2019   Procedure: XI ROBOTIC ASSISTED INGUINAL HERNIA REPAIR WITH MESH;  Surgeon: Jules Husbands, MD;  Location: [facility] ORS;  Service: General;  Laterality: Right;    Prior to Admission medications   Medication Sig Start Date End Date Taking? Authorizing Provider  cyclobenzaprine (FLEXERIL) 10 MG tablet Take 10 mg by mouth 3 (three) times daily as needed. 11/10/20  Yes [provider]  lisinopril (ZESTRIL) 40 MG tablet Take 1 tablet by mouth daily. 02/06/21  Yes [provider]  methylphenidate (RITALIN LA) 30 MG 24 hr capsule Take 30 mg by mouth every morning. 01/08/21  Yes [provider]  methylphenidate (RITALIN) 10 MG tablet Take 10 mg by mouth daily. 03/07/21  Yes [provider]  morphine (MSIR) 15 MG tablet Take 15 mg by mouth in the morning and at bedtime.   Yes  [provider]  prazosin (MINIPRESS) 1 MG capsule Take 3 mg by mouth at bedtime.    Yes [provider]  propranolol ER (INDERAL LA) 160 MG SR capsule Take 160 mg by mouth daily.   Yes [provider]  Sod Picosulfate-Mag Ox-Cit Acd (CLENPIQ) 10-3.5-12 MG-GM -GM/160ML SOLN Take 320 mLs by mouth as directed. 03/08/21  Yes Lucilla Lame, MD  tapentadol (NUCYNTA) 100 MG 12 hr tablet Take 100 mg by mouth in the morning and at bedtime.  07/06/10  Yes [provider]  dicyclomine (BENTYL) 10 MG capsule Take 1 capsule (10 mg total) by mouth 4 (four) times daily for 14 days. 01/13/21 01/27/21  Lavonia Drafts, MD  ondansetron (ZOFRAN ODT) 8 MG disintegrating tablet Take 1 tablet (8 mg total) by mouth every 8 (eight) hours as needed for nausea or vomiting. Patient not taking: Reported on 03/13/2021 01/15/21   Merlyn Lot, MD  testosterone cypionate (DEPOTESTOTERONE CYPIONATE) 100 MG/ML injection Inject 80 mg into the muscle every 7 (seven) days. For IM use only  Thursday's Patient not taking: Reported on 03/13/2021    [provider]  varenicline (CHANTIX PAK) 0.5 MG X 11 & 1 MG X 42 tablet Take 0.5 mg by mouth 2 (two) times daily.  Patient not taking: Reported on 03/19/2021    [provider]  Allergies as of 03/08/2021 - Review Complete 03/08/2021  Allergen Reaction Noted   Atorvastatin  06/12/2020   Brassica oleracea Hives 02/03/2016   Carvedilol  12/07/2018   Gabapentin  02/05/2016   Ketorolac tromethamine Other (See Comments) and Swelling 01/26/2009   Lidocaine  08/06/2016   Rosuvastatin  02/06/2021   Tramadol Other (See Comments) 09/04/2015   Carisoprodol Rash 05/03/2009    Family History  Problem Relation Age of Onset   Healthy Mother    Healthy Father     Social History   Socioeconomic History   Marital status: Married    Spouse name: Not on file   Number of children: Not on file   Years of education: Not on file   Highest  education level: Not on file  Occupational History   Not on file  Tobacco Use   Smoking status: Every Day    Packs/day: 0.33    Types: Cigarettes    Last attempt to quit: 11/23/2019    Years since quitting: 1.3   Smokeless tobacco: Never   Tobacco comments:    Currently using chantix to quit  Vaping Use   Vaping Use: Never used  Substance and Sexual Activity   Alcohol use: Never   Drug use: Not Currently   Sexual activity: Not Currently  Other Topics Concern   Not on file  Social History Narrative   Not on file   Social Determinants of Health   Financial Resource Strain: Not on file  Food Insecurity: Not on file  Transportation Needs: Not on file  Physical Activity: Not on file  Stress: Not on file  Social Connections: Not on file  Intimate Partner Violence: Not on file    Review of Systems: See HPI, otherwise negative ROS  Physical Exam: BP (!) 172/103   Pulse 96   Temp 99.3 F (37.4 C) (Temporal)   Resp 18   Ht '5\' 11"'$  (1.803 m)   Wt 93.9 kg   SpO2 98%   BMI 28.87 kg/m  General:   Alert,  pleasant and cooperative in NAD Head:  Normocephalic and atraumatic. Neck:  Supple; no masses or thyromegaly. Lungs:  Clear throughout to auscultation.    Heart:  Regular rate and rhythm. Abdomen:  Soft, nontender and nondistended. Normal bowel sounds, without guarding, and without rebound.   Neurologic:  Alert and  oriented x4;  grossly normal neurologically.  Impression/Plan: Mark Austin is here for an colonoscopy to be performed for history of diverticulitis  Risks, benefits, limitations, and alternatives regarding  colonoscopy have been reviewed with the patient.  Questions have been answered.  All parties agreeable.   Lucilla Lame, MD  03/19/2021, 10:16 AM

## 2021-03-19 NOTE — Transfer of Care (Signed)
Immediate Anesthesia Transfer of Care Note  Patient: Mark Austin  Procedure(s) Performed: COLONOSCOPY WITH BIOPSY (Rectum) POLYPECTOMY (Rectum)  Patient Location: PACU  Anesthesia Type: General  Level of Consciousness: awake, alert  and patient cooperative  Airway and Oxygen Therapy: Patient Spontanous Breathing and Patient connected to supplemental oxygen  Post-op Assessment: Post-op Vital signs reviewed, Patient's Cardiovascular Status Stable, Respiratory Function Stable, Patent Airway and No signs of Nausea or vomiting  Post-op Vital Signs: Reviewed and stable  Complications: No notable events documented.

## 2021-03-19 NOTE — Anesthesia Postprocedure Evaluation (Signed)
Anesthesia Post Note  Patient: Mark Austin  Procedure(s) Performed: COLONOSCOPY WITH BIOPSY (Rectum) POLYPECTOMY (Rectum)     Patient location during evaluation: PACU Anesthesia Type: General Level of consciousness: awake and alert Pain management: pain level controlled Vital Signs Assessment: post-procedure vital signs reviewed and stable Respiratory status: spontaneous breathing, nonlabored ventilation, respiratory function stable and patient connected to nasal cannula oxygen Cardiovascular status: blood pressure returned to baseline and stable Postop Assessment: no apparent nausea or vomiting Anesthetic complications: no   No notable events documented.  Wanda Plump Shaylynn Nulty

## 2021-03-19 NOTE — Anesthesia Preprocedure Evaluation (Signed)
Anesthesia Evaluation  Patient identified by MRN, date of birth, ID band Patient awake    Airway Mallampati: II  TM Distance: >3 FB Neck ROM: Full    Dental   Pulmonary Current Smoker and Patient abstained from smoking.,    Pulmonary exam normal        Cardiovascular hypertension, Normal cardiovascular exam     Neuro/Psych  Headaches, Seizures -,  Anxiety TIA   GI/Hepatic GERD  ,  Endo/Other    Renal/GU      Musculoskeletal   Abdominal   Peds  Hematology   Anesthesia Other Findings   Reproductive/Obstetrics                             Anesthesia Physical Anesthesia Plan  ASA: 2  Anesthesia Plan: General   Post-op Pain Management:    Induction: Intravenous  PONV Risk Score and Plan:   Airway Management Planned: Natural Airway  Additional Equipment:   Intra-op Plan:   Post-operative Plan:   Informed Consent: I have reviewed the patients History and Physical, chart, labs and discussed the procedure including the risks, benefits and alternatives for the proposed anesthesia with the patient or authorized representative who has indicated his/her understanding and acceptance.       Plan Discussed with:   Anesthesia Plan Comments:         Anesthesia Quick Evaluation

## 2021-03-19 NOTE — Anesthesia Procedure Notes (Signed)
Date/Time: 03/19/2021 10:59 AM Performed by: Mayme Genta, CRNA Pre-anesthesia Checklist: Patient identified, Emergency Drugs available, Suction available, Timeout performed and Patient being monitored Patient Re-evaluated:Patient Re-evaluated prior to induction Oxygen Delivery Method: Nasal cannula Placement Confirmation: positive ETCO2

## 2021-03-20 ENCOUNTER — Encounter: Payer: Self-pay | Admitting: Gastroenterology

## 2021-03-21 LAB — SURGICAL PATHOLOGY

## 2021-03-22 ENCOUNTER — Encounter: Payer: Self-pay | Admitting: Gastroenterology

## 2021-04-06 ENCOUNTER — Emergency Department
Admission: EM | Admit: 2021-04-06 | Discharge: 2021-04-07 | Disposition: A | Discharge status: Home / Self Care | Payer: Managed Care, Other (non HMO) | Attending: Emergency Medicine | Admitting: Emergency Medicine

## 2021-04-06 ENCOUNTER — Other Ambulatory Visit: Payer: Self-pay

## 2021-04-06 DIAGNOSIS — K219 Gastro-esophageal reflux disease without esophagitis: Secondary | ICD-10-CM | POA: Diagnosis not present

## 2021-04-06 DIAGNOSIS — R112 Nausea with vomiting, unspecified: Secondary | ICD-10-CM | POA: Insufficient documentation

## 2021-04-06 DIAGNOSIS — R1013 Epigastric pain: Secondary | ICD-10-CM | POA: Insufficient documentation

## 2021-04-06 DIAGNOSIS — F17210 Nicotine dependence, cigarettes, uncomplicated: Secondary | ICD-10-CM | POA: Diagnosis not present

## 2021-04-06 DIAGNOSIS — R000 Tachycardia, unspecified: Secondary | ICD-10-CM | POA: Diagnosis not present

## 2021-04-06 DIAGNOSIS — R197 Diarrhea, unspecified: Secondary | ICD-10-CM | POA: Insufficient documentation

## 2021-04-06 DIAGNOSIS — I10 Essential (primary) hypertension: Secondary | ICD-10-CM | POA: Insufficient documentation

## 2021-04-06 DIAGNOSIS — Z79899 Other long term (current) drug therapy: Secondary | ICD-10-CM | POA: Diagnosis not present

## 2021-04-06 LAB — COMPREHENSIVE METABOLIC PANEL
ALT: 28 U/L (ref 0–44)
AST: 23 U/L (ref 15–41)
Albumin: 4.8 g/dL (ref 3.5–5.0)
Alkaline Phosphatase: 66 U/L (ref 38–126)
Anion gap: 11 (ref 5–15)
BUN: 12 mg/dL (ref 6–20)
CO2: 23 mmol/L (ref 22–32)
Calcium: 9.9 mg/dL (ref 8.9–10.3)
Chloride: 101 mmol/L (ref 98–111)
Creatinine, Ser: 0.81 mg/dL (ref 0.61–1.24)
GFR, Estimated: >60 mL/min (ref >60)
Glucose, Bld: 87 mg/dL (ref 70–99)
Potassium: 3.8 mmol/L (ref 3.5–5.1)
Sodium: 135 mmol/L (ref 135–145)
Total Bilirubin: 1.2 mg/dL (ref 0.3–1.2)
Total Protein: 8.4 g/dL — ABNORMAL HIGH (ref 6.5–8.1)

## 2021-04-06 LAB — CBC
HCT: 53.2 % — ABNORMAL HIGH (ref 39.0–52.0)
Hemoglobin: 18.9 g/dL — ABNORMAL HIGH (ref 13.0–17.0)
MCH: 31.8 pg (ref 26.0–34.0)
MCHC: 35.5 g/dL (ref 30.0–36.0)
MCV: 89.6 fL (ref 80.0–100.0)
Platelets: 244 10*3/uL (ref 150–400)
RBC: 5.94 MIL/uL — ABNORMAL HIGH (ref 4.22–5.81)
RDW: 12.2 % (ref 11.5–15.5)
WBC: 10.9 10*3/uL — ABNORMAL HIGH (ref 4.0–10.5)
nRBC: 0 % (ref 0.0–0.2)

## 2021-04-06 LAB — LIPASE, BLOOD: Lipase: 63 U/L — ABNORMAL HIGH (ref 11–51)

## 2021-04-06 LAB — TROPONIN I (HIGH SENSITIVITY): Troponin I (High Sensitivity): 3 ng/L (ref <18)

## 2021-04-06 MED ORDER — DROPERIDOL 2.5 MG/ML IJ SOLN
2.5000 mg | Freq: Once | INTRAMUSCULAR | Status: AC
  Administered 2021-04-06: 2.5 mg via INTRAVENOUS
  Filled 2021-04-06: qty 2

## 2021-04-06 MED ORDER — ALUM & MAG HYDROXIDE-SIMETH 200-200-20 MG/5ML PO SUSP
30.0000 mL | Freq: Once | ORAL | Status: AC
  Administered 2021-04-07: 30 mL via ORAL
  Filled 2021-04-06: qty 30

## 2021-04-06 MED ORDER — SUCRALFATE 1 G PO TABS
1.0000 g | ORAL_TABLET | Freq: Once | ORAL | Status: AC
  Administered 2021-04-07: 1 g via ORAL
  Filled 2021-04-06: qty 1

## 2021-04-06 MED ORDER — LACTATED RINGERS IV BOLUS
1000.0000 mL | Freq: Once | INTRAVENOUS | Status: AC
  Administered 2021-04-06: 1000 mL via INTRAVENOUS

## 2021-04-06 NOTE — ED Triage Notes (Addendum)
Pt states severe upper abd pain that radiates to back for several days with vomiting, diarrhea, sweating. Pt appears uncomfortable. Pt states has had cholecystectomy and has a history of htn. Pt states he feels weak and has not been able to keep po down.

## 2021-04-07 ENCOUNTER — Emergency Department: Payer: Managed Care, Other (non HMO)

## 2021-04-07 IMAGING — CR DG ABDOMEN ACUTE W/ 1V CHEST
3 series · 3 of 3 positions shown · non-contrast
Comparison: CT abdomen pelvis dated [DATE].

CLINICAL DATA: Epigastric pain.

EXAM:
DG ABDOMEN ACUTE WITH 1 VIEW CHEST

[chest pa]
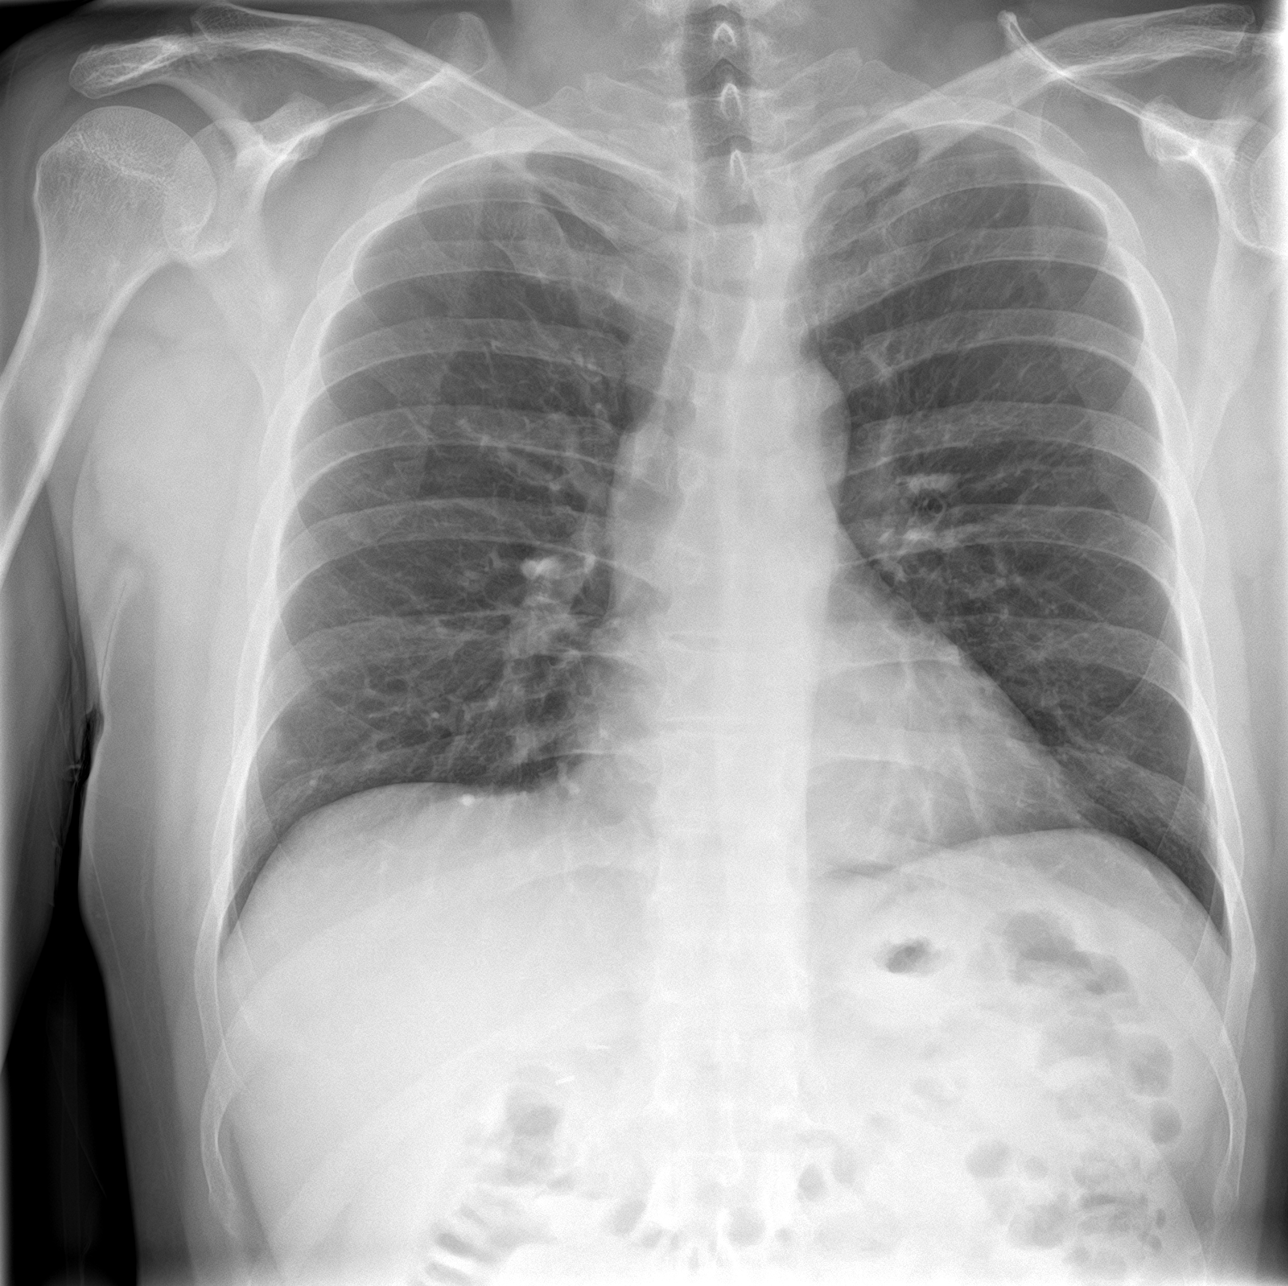

[abdomen erect]
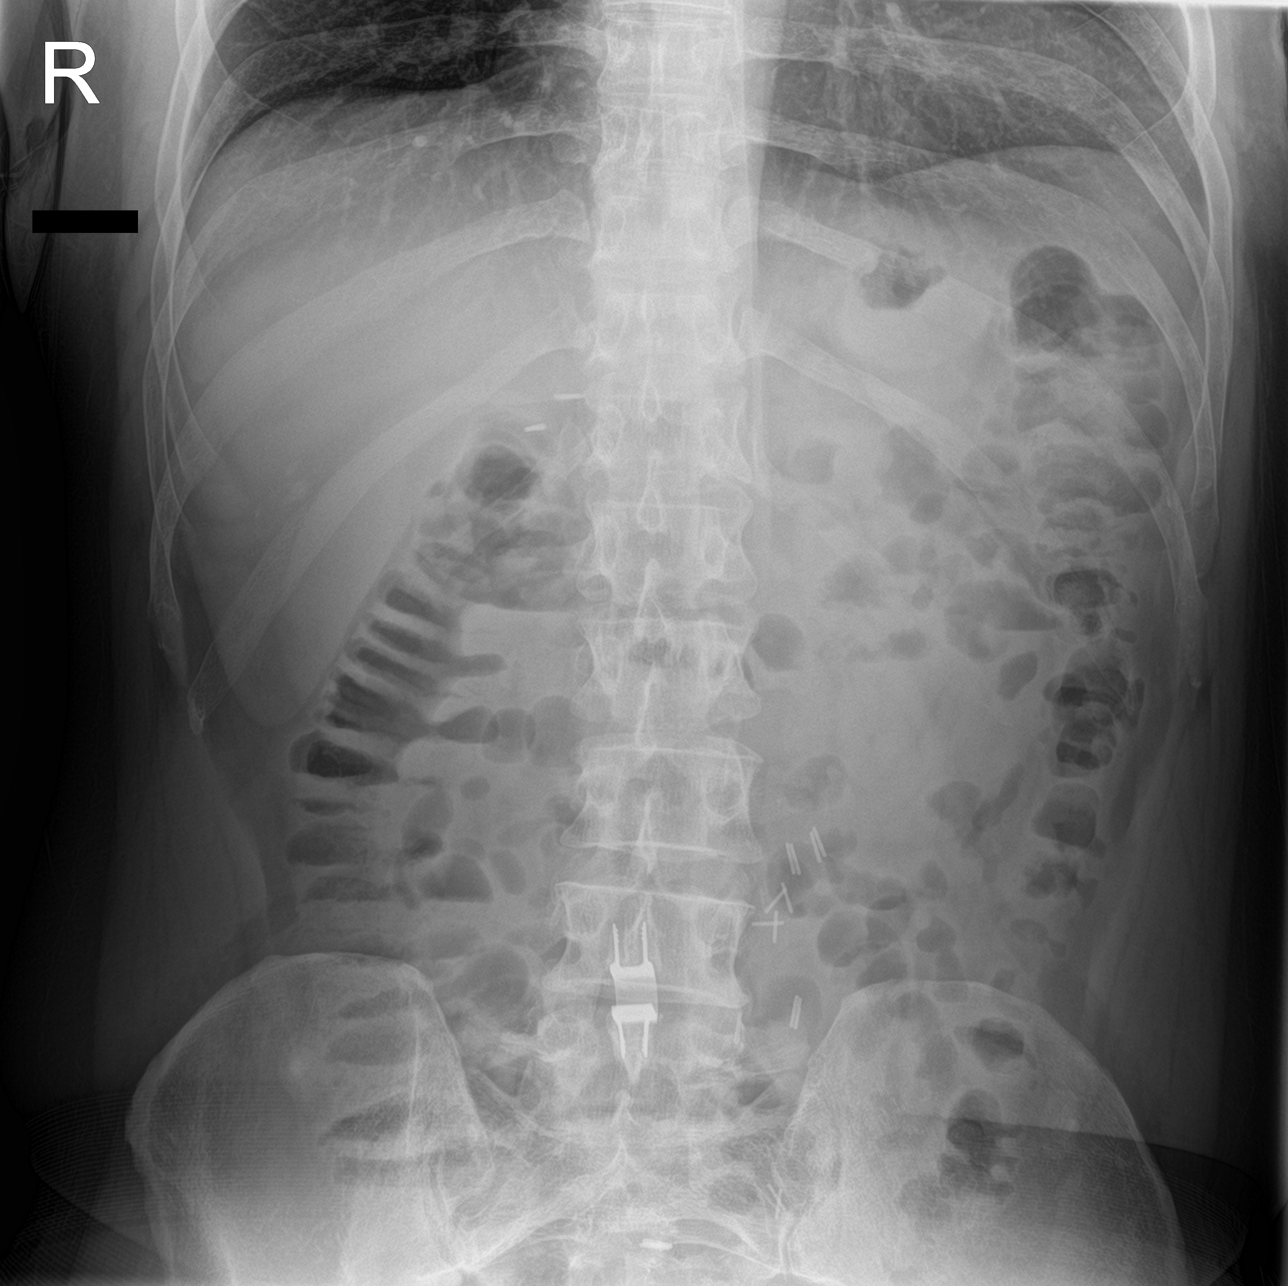

[abdomen supine]
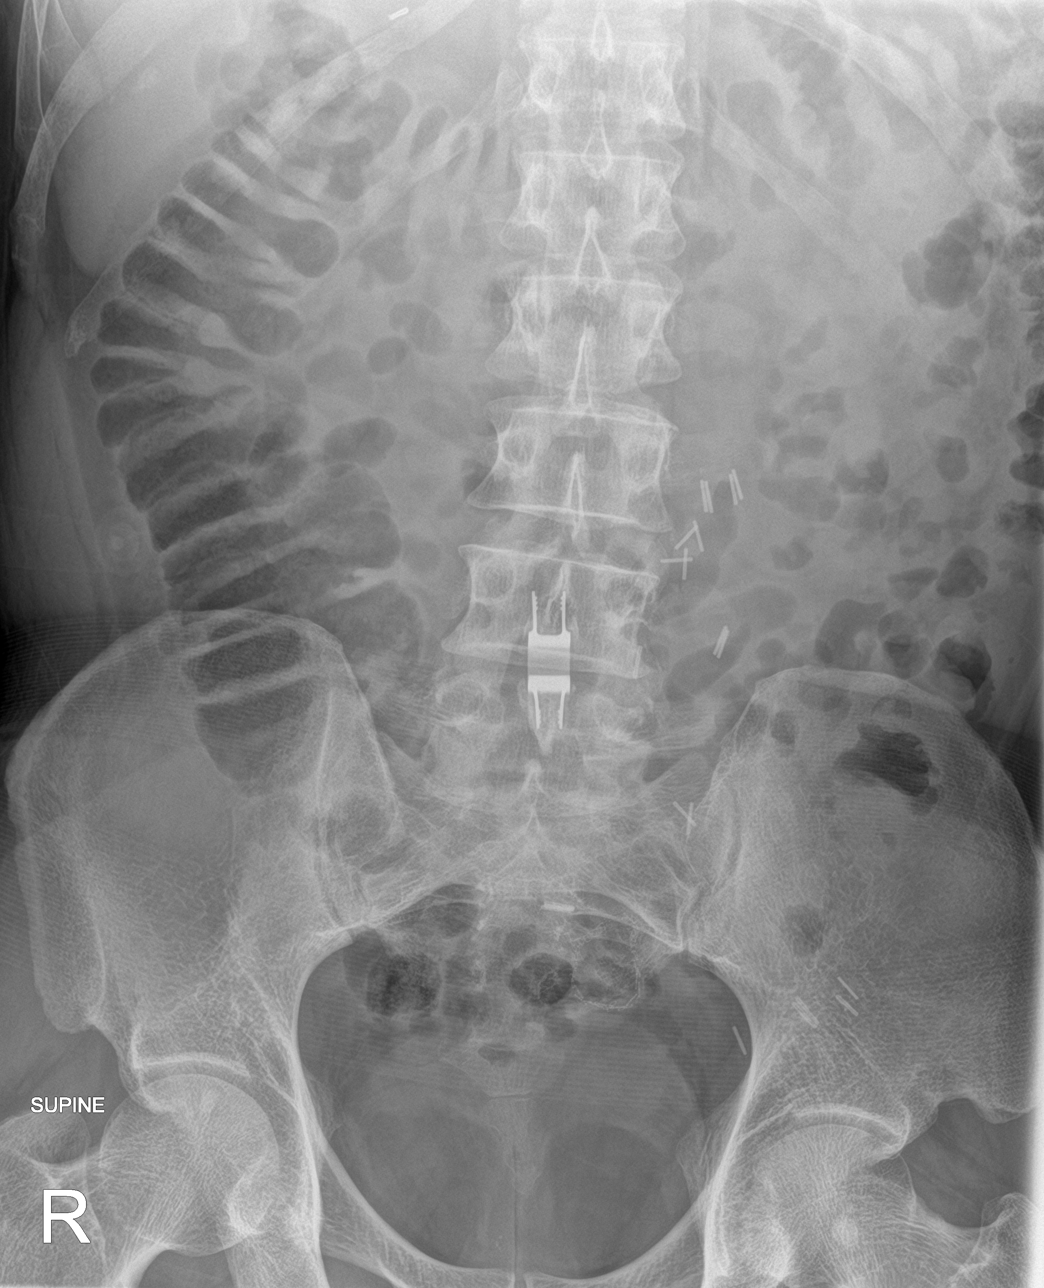

[3 of 3 positions shown; findings below may reference images not displayed]

FINDINGS: The lungs are clear. There is no pleural effusion pneumothorax. The
cardiac silhouette is within limits.

No bowel dilatation or evidence of obstruction. No free air or
radiopaque calculi. Multiple surgical clips noted throughout the
abdomen. L4-L5 spinous process fusion. No acute osseous pathology.
IMPRESSION: Negative abdominal radiographs. No acute cardiopulmonary disease.

## 2021-04-07 MED ORDER — DICYCLOMINE HCL 10 MG PO CAPS: 10.0000 mg | ORAL_CAPSULE | Freq: Three times a day (TID) | ORAL | 0 refills | Status: DC | PRN

## 2021-04-07 MED ORDER — LACTATED RINGERS IV BOLUS
1000.0000 mL | Freq: Once | INTRAVENOUS | Status: AC
  Administered 2021-04-07: 1000 mL via INTRAVENOUS

## 2021-04-07 MED ORDER — ONDANSETRON 4 MG PO TBDP: ORAL_TABLET | ORAL | 0 refills | Status: DC

## 2021-04-07 MED ORDER — DIPHENHYDRAMINE HCL 50 MG/ML IJ SOLN
25.0000 mg | INTRAMUSCULAR | Status: AC
  Administered 2021-04-07: 25 mg via INTRAVENOUS
  Filled 2021-04-07: qty 1

## 2021-04-07 MED ORDER — SUCRALFATE 1 G PO TABS: 1.0000 g | ORAL_TABLET | Freq: Four times a day (QID) | ORAL | 1 refills | Status: DC | PRN

## 2021-04-07 MED ORDER — OMEPRAZOLE MAGNESIUM 20 MG PO TBEC: 20.0000 mg | DELAYED_RELEASE_TABLET | Freq: Every day | ORAL | 1 refills | Status: DC

## 2021-04-07 NOTE — ED Provider Notes (Signed)
[facility] Emergency Department Provider Note  ____________________________________________   Event Date/Time   First MD Initiated Contact with Patient 04/06/21 2300     (approximate)  I have reviewed the triage vital signs and the nursing notes.   HISTORY  Chief Complaint Abdominal Pain    HPI Mark Austin is a 40 y.o. male with medical history as listed below which notably includes frequent issues with abdominal discomfort of various sorts.  He was seen 3 times in the emergency department in the last 6 months and has also had a colonoscopy a few weeks ago by Dr. Allen Norris.  He said that the symptoms tonight are very different.  It is currently Friday night, and he said that starting on Monday (4 to 5 days ago) he developed burning and sharp mid upper abdominal pain with associated nausea and vomiting.  It has been waxing and waning and will go away for little bit but then comes right back.  He has had significantly decreased oral intake because he said he vomits whenever he tries to eat anything.  He said that he did not call any of his doctors because he thought it was just a virus that would go away but by tonight the symptoms are gradually gotten worse and have become severe.  He is very uncomfortable and nothing in particular makes it better or worse.  He has also been having watery diarrhea.  No lower abdominal pain.  He denies fever, sore throat, chest pain, shortness of breath.   His history includes a cholecystectomy about 3 years ago.  His medical record indicates that he has a chronically mildly elevated lipase.    Past Medical History:  Diagnosis Date   Back ache    Diverticulitis    GERD (gastroesophageal reflux disease)    History of kidney stones    Hypertension    Inguinal hernia    Seizure ([place])    over 20 yrs ago.  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   Benign hypertension 03/07/2021   Chronic abdominal pain 03/07/2021   Migraine headache 03/07/2021   Myalgia and myositis 03/07/2021   Anxiety state 03/07/2021   TIA (transient ischemic attack) 11/25/2016   Constipation 08/22/2016    Past Surgical History:  Procedure Laterality Date   back surgeries     x5 lumbar L4L5   BACK SURGERY     BOWEL RESECTION  2016   CHOLECYSTECTOMY  2017   COLON SURGERY     COLONOSCOPY     COLONOSCOPY WITH PROPOFOL N/A 03/19/2021   Procedure: COLONOSCOPY WITH BIOPSY;  Surgeon: Lucilla Lame, MD;  Location: [place];  Service: Endoscopy;  Laterality: N/A;   EXCISION MASS LOWER EXTREMETIES Left 11/30/2019   Procedure: EXCISION MASS LOWER EXTREMETIES, Left Hip;  Surgeon: Jules Husbands, MD;  Location: [facility];  Service: General;  Laterality: Left;   HERNIA REPAIR     INGUINAL HERNIA REPAIR Left    POLYPECTOMY N/A 03/19/2021   Procedure: POLYPECTOMY;  Surgeon: Lucilla Lame, MD;  Location: [place];  Service: Endoscopy;  Laterality: N/A;   SHOULDER SURGERY Right    rotator cuff tear   XI ROBOTIC ASSISTED INGUINAL HERNIA REPAIR WITH MESH Right 10/08/2019   Procedure: XI ROBOTIC ASSISTED  INGUINAL HERNIA REPAIR WITH MESH;  Surgeon: Jules Husbands, MD;  Location: [facility];  Service: General;  Laterality: Right;    Prior to Admission medications   Medication Sig Start Date End Date Taking? Authorizing Provider  dicyclomine (BENTYL) 10 MG capsule Take 1 capsule (10 mg total) by mouth 3 (three) times daily as needed for up to 14 days for spasms. or abdominal pain 04/07/21 04/21/21 Yes Hinda Kehr, MD  omeprazole (PRILOSEC OTC) 20 MG tablet Take 1 tablet (20 mg total) by mouth daily. 04/07/21 04/07/22 Yes Hinda Kehr, MD  ondansetron (ZOFRAN ODT) 4 MG disintegrating tablet Allow 1-2 tablets to dissolve in your mouth every 8 hours  as needed for nausea/vomiting 04/07/21  Yes Hinda Kehr, MD  sucralfate (CARAFATE) 1 g tablet Take 1 tablet (1 g total) by mouth 4 (four) times daily as needed (for abdominal discomfort, nausea, and/or vomiting). 04/07/21  Yes Hinda Kehr, MD  cyclobenzaprine (FLEXERIL) 10 MG tablet Take 10 mg by mouth 3 (three) times daily as needed. 11/10/20   [provider]  lisinopril (ZESTRIL) 40 MG tablet Take 1 tablet by mouth daily. 02/06/21   [provider]  methylphenidate (RITALIN LA) 30 MG 24 hr capsule Take 30 mg by mouth every morning. 01/08/21   [provider]  methylphenidate (RITALIN) 10 MG tablet Take 10 mg by mouth daily. 03/07/21   [provider]  morphine (MSIR) 15 MG tablet Take 15 mg by mouth in the morning and at bedtime.    [provider]  prazosin (MINIPRESS) 1 MG capsule Take 3 mg by mouth at bedtime.     [provider]  propranolol ER (INDERAL LA) 160 MG SR capsule Take 160 mg by mouth daily.    [provider]  Sod Picosulfate-Mag Ox-Cit Acd (CLENPIQ) 10-3.5-12 MG-GM -GM/160ML SOLN Take 320 mLs by mouth as directed. 03/08/21   Lucilla Lame, MD  tapentadol (NUCYNTA) 100 MG 12 hr tablet Take 100 mg by mouth in the morning and at bedtime.  07/06/10   [provider]  testosterone cypionate (DEPOTESTOTERONE CYPIONATE) 100 MG/ML injection Inject 80 mg into the muscle every 7 (seven) days. For IM use only  Thursday's Patient not taking: Reported on 03/13/2021    [provider]  varenicline (CHANTIX PAK) 0.5 MG X 11 & 1 MG X 42 tablet Take 0.5 mg by mouth 2 (two) times daily.  Patient not taking: Reported on 03/19/2021    [provider]    Allergies Atorvastatin, Brassica oleracea, Carvedilol, Gabapentin, Ketorolac tromethamine, Lidocaine, Rosuvastatin, Tramadol, and Carisoprodol  Family History  Problem Relation Age of Onset   Healthy Mother    Healthy Father     Social History Social History    Tobacco Use   Smoking status: Every Day    Packs/day: 0.33    Types: Cigarettes    Last attempt to quit: 11/23/2019    Years since quitting: 1.3   Smokeless tobacco: Never   Tobacco comments:    Currently using chantix to quit  Vaping Use   Vaping Use: Never used  Substance Use Topics   Alcohol use: Never   Drug use: Not Currently    Review of Systems Constitutional: No fever/chills Eyes: No visual changes. ENT: No sore throat. Cardiovascular: Denies chest pain. Respiratory: Denies shortness of breath. Gastrointestinal: Epigastric abdominal pain with nausea and vomiting x4 to 5 days with watery diarrhea. Genitourinary: Negative for dysuria. Musculoskeletal: Negative for neck pain.  Negative for back pain. Integumentary:  Negative for rash. Neurological: Negative for headaches, focal weakness or numbness.   ____________________________________________   PHYSICAL EXAM:  VITAL SIGNS: ED Triage Vitals  Enc Vitals Group     BP 04/06/21 2020 (!) 163/119     Pulse Rate 04/06/21 2020 (!) 103     Resp 04/06/21 2020 19     Temp 04/06/21 2020 98.9 F (37.2 C)     Temp Source 04/06/21 2020 Oral     SpO2 04/06/21 2020 96 %     Weight 04/06/21 2040 90.7 kg (200 lb)     Height 04/06/21 2040 1.778 m ('5\' 10"'$ )     Head Circumference --      Peak Flow --      Pain Score 04/06/21 2040 6     Pain Loc --      Pain Edu? --      Excl. in [place]? --     Constitutional: Alert and oriented.  Appears uncomfortable. Eyes: Conjunctivae are normal.  Head: Atraumatic. Nose: No congestion/rhinnorhea. Mouth/Throat: Patient is wearing a mask. Neck: No stridor.  No meningeal signs.   Cardiovascular: Normal rate, regular rhythm. Good peripheral circulation. Respiratory: Normal respiratory effort.  No retractions. Gastrointestinal: Soft and nondistended.  Patient reports mild but diffuse tenderness to palpation throughout the abdomen without any specific localized tenderness.  No apparent  tenderness to palpation of the epigastrium nor right upper quadrant with negative Murphy sign.  No tenderness to McBurney's point.  No rebound and no guarding. Musculoskeletal: No lower extremity tenderness nor edema. No gross deformities of extremities. Neurologic:  Normal speech and language. No gross focal neurologic deficits are appreciated.  Skin:  Skin is warm, dry and intact. Psychiatric: Mood and affect are normal. Speech and behavior are normal.  ____________________________________________   LABS (all labs ordered are listed, but only abnormal results are displayed)  Labs Reviewed  LIPASE, BLOOD - Abnormal; Notable for the following components:      Result Value   Lipase 63 (*)    All other components within normal limits  COMPREHENSIVE METABOLIC PANEL - Abnormal; Notable for the following components:   Total Protein 8.4 (*)    All other components within normal limits  CBC - Abnormal; Notable for the following components:   WBC 10.9 (*)    RBC 5.94 (*)    Hemoglobin 18.9 (*)    HCT 53.2 (*)    All other components within normal limits  TROPONIN I (HIGH SENSITIVITY)   ____________________________________________  EKG  ED ECG REPORT I, Hinda Kehr, the attending physician, personally viewed and interpreted this ECG.  Date: 04/06/2021 EKG Time: 20: 26 Rate: 94 Rhythm: normal sinus rhythm QRS Axis: normal Intervals: normal ST/T Wave abnormalities: Non-specific ST segment / T-wave changes including inverted T waves in lead III, but no clear evidence of acute ischemia. Narrative Interpretation: no definitive evidence of acute ischemia; does not meet STEMI criteria.  ____________________________________________  RADIOLOGY I, Hinda Kehr, personally viewed and evaluated these images (plain radiographs) as part of my medical decision making, as well as reviewing the written report by the radiologist.  ED MD interpretation:  No acute abnormalities  Official  radiology report(s): DG Abdomen Acute W/Chest  Result Date: 04/07/2021 CLINICAL DATA:  Epigastric pain. EXAM: DG ABDOMEN ACUTE WITH 1 VIEW CHEST COMPARISON:  CT abdomen pelvis dated 01/15/2021. FINDINGS: The lungs are clear. There is no pleural effusion pneumothorax. The cardiac silhouette is within limits. No bowel dilatation or evidence of obstruction. No free air or  radiopaque calculi. Multiple surgical clips noted throughout the abdomen. L4-L5 spinous process fusion. No acute osseous pathology. IMPRESSION: Negative abdominal radiographs. No acute cardiopulmonary disease. Electronically Signed   By: Anner Crete M.D.   On: 04/07/2021 01:29    ____________________________________________   PROCEDURES   Procedure(s) performed (including Critical Care):  .1-3 Lead EKG Interpretation Performed by: Hinda Kehr, MD Authorized by: Hinda Kehr, MD     Interpretation: normal     ECG rate:  95   ECG rate assessment: normal     Rhythm: sinus rhythm     Ectopy: none     Conduction: normal     ____________________________________________   INITIAL IMPRESSION / MDM / ASSESSMENT AND PLAN / ED COURSE  As part of my medical decision making, I reviewed the following data within the [facility] notes reviewed and incorporated, Labs reviewed , EKG interpreted , Old chart reviewed, Radiograph reviewed , Notes from prior ED visits, and [place] Controlled Substance Database   Differential diagnosis includes, but is not limited to, gastritis, pancreatitis, retained biliary stone, SBO/ileus, diverticulitis, appendicitis.  ACS is very unlikely based on his signs and symptoms.  The patient is on the cardiac monitor to evaluate for evidence of arrhythmia and/or significant heart rate changes.  EKG is reassuring with no evidence of ischemia.  Blood pressure is elevated and the patient is mildly tachycardic, otherwise vital signs are reassuring.  CBC is essentially normal other  than some hemoconcentration with a hemoglobin of 18.9 which likely reflects volume depletion.  CMP is also very reassuring and all within normal limits except for very slightly elevated total protein; he has no evidence of biliary obstruction and his anion gap is normal, electrolytes are normal, normal renal function.  High-sensitivity troponin is normal.  Lipase is slightly elevated at 63, but looking back through his medical record shows that his lipase has been more significantly elevated the last 2 times he has had it checked.  He is status postcholecystectomy only few years ago, long enough it is unlikely to be a retained stone from after his surgery but unlikely that he has developed new hepatic stones.  Additionally, he has a reassuring physical exam with no localized abdominal tenderness to palpation, negative Murphy sign, etc.  Plan is to treat both his pain and the nausea/vomiting with droperidol 2.5 mg IV as well as to treat his gastritis with Carafate 1 g p.o. and Maalox 30 mils p.o.  I we will also rehydrate him with 2 L lactated Ringer's.  I will check acute abdomen series to look for any evidence of obstruction.  I do not think he would benefit from a CT scan; he has received multiple CT scans including a CTA of the abdomen pelvis within the last few months, and based on his exam I do not think he has developed a new acute intra-abdominal issue other than what appears to be a nonspecific gastritis.  He understands and agrees with the plan.     Clinical Course as of 04/07/21 0139  Sat Apr 07, 2021  0022 Patient is feeling much better but is a little bit agitated, possibly a side effect of the droperidol.  I will give him Benadryl 25 mg IV which should help.  However the patient does state he feels much better after the treatment. [CF]  0132 DG Abdomen Acute W/Chest I personally reviewed the patient's imaging and agree with the radiologist's interpretation that there are no acute abnormalities  on the  2 view abdomen and chest x-rays. [CF]  [phone] Patient said he is feeling much better and gave me 2 thumbs up.  No acute distress at this time.  I have dated him with the reassuring imaging studies and he agrees he is ready to go home.  Medications as listed below and I encouraged follow-up with Dr. Allen Norris whom he has seen in the past.  I gave my usual and customary return precautions. [CF]    Clinical Course User Index [CF] Hinda Kehr, MD     ____________________________________________  FINAL CLINICAL IMPRESSION(S) / ED DIAGNOSES  Final diagnoses:  Epigastric pain  Nausea vomiting and diarrhea     MEDICATIONS GIVEN DURING THIS VISIT:  Medications  droperidol (INAPSINE) 2.5 MG/ML injection 2.5 mg (2.5 mg Intravenous Given 04/06/21 2340)  lactated ringers bolus 1,000 mL (0 mLs Intravenous Stopped 04/07/21 0040)  sucralfate (CARAFATE) tablet 1 g (1 g Oral Given 04/07/21 0018)  alum & mag hydroxide-simeth (MAALOX/MYLANTA) 200-200-20 MG/5ML suspension 30 mL (30 mLs Oral Given 04/07/21 0019)  lactated ringers bolus 1,000 mL (1,000 mLs Intravenous New Bag/Given 04/07/21 0044)  diphenhydrAMINE (BENADRYL) injection 25 mg (25 mg Intravenous Given 04/07/21 0042)     ED Discharge Orders          Ordered    dicyclomine (BENTYL) 10 MG capsule  3 times daily PRN        04/07/21 0137    ondansetron (ZOFRAN ODT) 4 MG disintegrating tablet        04/07/21 0137    sucralfate (CARAFATE) 1 g tablet  4 times daily PRN        04/07/21 0137    omeprazole (PRILOSEC OTC) 20 MG tablet  Daily        04/07/21 0137             Note:  This document was prepared using Dragon voice recognition software and may include unintentional dictation errors.   Hinda Kehr, MD 04/07/21 [phone]

## 2021-04-07 NOTE — Discharge Instructions (Signed)

## 2021-04-07 NOTE — ED Notes (Signed)
Pt reports that he feels a lot better and thinks able to tolerate po meds, see mar. Reports uneasiness

## 2021-05-14 DIAGNOSIS — R569 Unspecified convulsions: Secondary | ICD-10-CM | POA: Insufficient documentation

## 2021-05-28 ENCOUNTER — Ambulatory Visit: Payer: Self-pay | Admitting: *Deleted

## 2021-05-28 NOTE — Telephone Encounter (Signed)
Received call from [place], from [employer], case manager [phone] ext. [phone]. Daneil Dan reports she has contacted patient and noted elevated B/P . 200/ 12/ and 175/100. Daneil Dan called to request assistance to advise patient to go to ED for evaluation due to elevated B/P and symptoms. NT called patient to triage and patient reports he has appt scheduled  to establish as new patient with Dr. Zigmund Daniel tomorrow.  B/P are elevated and at 11:46 am B/P 191/124 HR 86 and at 11:59 B/P 205/123 HR 96. Patient reports he is taking medications as directed lisinopril and propranolol ER. C/o visual changes, constant headache and feeling exhausted. Instructed patient to go to ED now or call 911. Patient reports he has been to ED multiple times with elevated BP and is always sent home. C/o he has had episodes of "losing consciousness" 5 times in the past week. C/o he will feel anxious , disoriented, sits himself on the ground so he doesn't fall, eyes flutter and twitching a little before he comes out of it. Reports sometimes it take 5- 10 minutes to feel better after episodes and he will be "dretched in sweat" and HR elevated.  C/o dry cough x 2 weeks , unable to sleep, tested for covid x 2 and remains negative. Continued to instruct patient to be evaluated in the ED or call 911. Encouraged to increase water intake, rest if he will not seek assistance in ED at this time.  Care advise given. Patient verbalized understanding of care advise and to call back or go to ED or call 911 if symptoms worsen.

## 2021-05-28 NOTE — Telephone Encounter (Signed)
Reason for Disposition . [2] Systolic BP  >= 947 OR Diastolic >= 654 AND [6] cardiac or neurologic symptoms (e.g., chest pain, difficulty breathing, unsteady gait, blurred vision)  Answer Assessment - Initial Assessment Questions 1. BLOOD PRESSURE: "What is the blood pressure?" "Did you take at least two measurements 5 minutes apart?"     11:46 191/124 HR 86  11:59 205/123 HR 96 2. ONSET: "When did you take your blood pressure?"     Just now  3. HOW: "How did you obtain the blood pressure?" (e.g., visiting nurse, automatic home BP monitor)     Automatic BP monitor  4. HISTORY: "Do you have a history of high blood pressure?"     Yes  5. MEDICATIONS: "Are you taking any medications for blood pressure?" "Have you missed any doses recently?"     Yes lisinopril, propranolol ER 6. OTHER SYMPTOMS: "Do you have any symptoms?" (e.g., headache, chest pain, blurred vision, difficulty breathing, weakness)    Constant headache , vision changes, exhaustion 7. PREGNANCY: "Is there any chance you are pregnant?" "When was your last menstrual period?"     na  Protocols used: Blood Pressure - High-A-AH

## 2021-05-29 ENCOUNTER — Other Ambulatory Visit: Payer: Self-pay

## 2021-05-29 ENCOUNTER — Ambulatory Visit (INDEPENDENT_AMBULATORY_CARE_PROVIDER_SITE_OTHER): Payer: Managed Care, Other (non HMO) | Admitting: Family Medicine

## 2021-05-29 ENCOUNTER — Encounter: Payer: Self-pay | Admitting: Family Medicine

## 2021-05-29 VITALS — BP 148/108 | HR 88 | Temp 98.6°F | Ht 70.0 in | Wt 217.0 lb

## 2021-05-29 DIAGNOSIS — I10 Essential (primary) hypertension: Secondary | ICD-10-CM

## 2021-05-29 DIAGNOSIS — G4733 Obstructive sleep apnea (adult) (pediatric): Secondary | ICD-10-CM | POA: Diagnosis not present

## 2021-05-29 DIAGNOSIS — M5441 Lumbago with sciatica, right side: Secondary | ICD-10-CM

## 2021-05-29 DIAGNOSIS — G8929 Other chronic pain: Secondary | ICD-10-CM | POA: Insufficient documentation

## 2021-05-29 DIAGNOSIS — F411 Generalized anxiety disorder: Secondary | ICD-10-CM

## 2021-05-29 DIAGNOSIS — M5442 Lumbago with sciatica, left side: Secondary | ICD-10-CM

## 2021-05-29 DIAGNOSIS — F4310 Post-traumatic stress disorder, unspecified: Secondary | ICD-10-CM | POA: Insufficient documentation

## 2021-05-29 DIAGNOSIS — N529 Male erectile dysfunction, unspecified: Secondary | ICD-10-CM | POA: Insufficient documentation

## 2021-05-29 DIAGNOSIS — E291 Testicular hypofunction: Secondary | ICD-10-CM | POA: Diagnosis not present

## 2021-05-29 NOTE — Assessment & Plan Note (Signed)
See additional assessment(s) for plan details. 

## 2021-05-29 NOTE — Assessment & Plan Note (Signed)
Chronic issue reported by patient for which he is obtaining TRT from outside group which he plans to continue with. He is interested in endocrinology referral for further evaluation of this among other "hormone concerns". His current outside group only performs medication management without evaluation. I have advised referral to endocrinology for further evaluation.

## 2021-05-29 NOTE — Assessment & Plan Note (Signed)
Chronic condition that was deemed appropriate for CPAP which he has not been using, is interested in restarting management for OSA with possible alternate treatments (nasal, Inspire, etc). I have placed a referral to ENT / Sleep Medicine for further management.

## 2021-05-29 NOTE — Patient Instructions (Signed)
-   Referral coordinator will contact you for follow-ups with endocrinology, ENT/sleep medicine, pain management - Maintain follow-ups with cardiology and neurology - Talk to your current psychiatry group about possible medication changes as discussed if approved by them - Return for follow-up in 2 weeks for annual physical

## 2021-05-29 NOTE — Assessment & Plan Note (Signed)
Chronic issue for which he sees psychiatric group for medication management and cognitive behavioral therapy. I have discussed possible further review of medications with minimization / avoidance of benzodiazepine and amphetamine substances given comorbid anxiety,depression, ADHD, and PTSD. He will review this with his psychiatry group.

## 2021-05-29 NOTE — Assessment & Plan Note (Signed)
Chronic issue for which he has seen pain management in the past, interested in establishing with group for long-term medication management.
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
## 2021-05-29 NOTE — Assessment & Plan Note (Addendum)
Chronic issue that is uncontrolled, he has had recent reassuring cardiac workup, per patient. Cardiopulmomary physical exam findings benign today beyond BP. Recheck reveals improved, though still elevated, readings. Has upcoming visit with cardiology and is amenable to consistent follow-up. We will follow peripherally.

## 2021-05-30 ENCOUNTER — Other Ambulatory Visit: Payer: Self-pay

## 2021-05-30 DIAGNOSIS — K5900 Constipation, unspecified: Secondary | ICD-10-CM

## 2021-05-30 DIAGNOSIS — I10 Essential (primary) hypertension: Secondary | ICD-10-CM

## 2021-05-30 DIAGNOSIS — K5732 Diverticulitis of large intestine without perforation or abscess without bleeding: Secondary | ICD-10-CM

## 2021-05-30 MED ORDER — DICYCLOMINE HCL 10 MG PO CAPS: 10.0000 mg | ORAL_CAPSULE | Freq: Three times a day (TID) | ORAL | 0 refills | Status: DC | PRN

## 2021-05-30 MED ORDER — SUCRALFATE 1 G PO TABS: 1.0000 g | ORAL_TABLET | Freq: Four times a day (QID) | ORAL | 0 refills | Status: DC | PRN

## 2021-05-30 MED ORDER — PRAZOSIN HCL 1 MG PO CAPS: 1.0000 mg | ORAL_CAPSULE | Freq: Two times a day (BID) | ORAL | 0 refills | Status: DC

## 2021-05-30 MED ORDER — LISINOPRIL 40 MG PO TABS: 40.0000 mg | ORAL_TABLET | Freq: Two times a day (BID) | ORAL | 0 refills | Status: DC

## 2021-05-30 MED ORDER — PROPRANOLOL HCL ER 160 MG PO CP24: 160.0000 mg | ORAL_CAPSULE | Freq: Every day | ORAL | 0 refills | Status: DC

## 2021-05-30 MED ORDER — SENNOSIDES-DOCUSATE SODIUM 8.6-50 MG PO TABS: 2.0000 | ORAL_TABLET | ORAL | 0 refills | Status: DC | PRN

## 2021-06-18 ENCOUNTER — Encounter: Payer: Managed Care, Other (non HMO) | Admitting: Family Medicine

## 2021-06-18 LAB — HEPATIC FUNCTION PANEL: Bilirubin, Total: 0.4

## 2021-06-18 LAB — PROTIME-INR: Protime: 10 (ref 10.0–13.8)

## 2021-06-18 LAB — COMPREHENSIVE METABOLIC PANEL
Albumin: 3.9 (ref 3.5–5.0)
Globulin: 3.4

## 2021-06-18 LAB — POCT INR: INR: 1 (ref <=1.20)

## 2021-06-18 LAB — TSH: TSH: 0.57 (ref <=5.90)

## 2021-06-19 LAB — BASIC METABOLIC PANEL
Creatinine: 0.7 (ref 0.6–1.3)
Glucose: 103

## 2021-06-25 ENCOUNTER — Telehealth: Payer: Self-pay

## 2021-06-25 NOTE — Telephone Encounter (Signed)
Copied from [place] [id number]. Topic: Appointment Scheduling - Scheduling Inquiry for Clinic >> Jun 20, 2021 12:33 PM Alanda Slim E wrote: Reason for CRM: Pt needs a hosp f/u with in two weeks / pt has appt 12.14.22 but not sure if that is ok/ pts BP were 223/126/ pt was prescribed Spironol 25MG  2xs a day / please advise about sooner appt

## 2021-06-28 ENCOUNTER — Other Ambulatory Visit: Payer: Self-pay | Admitting: Family Medicine

## 2021-06-28 DIAGNOSIS — K5732 Diverticulitis of large intestine without perforation or abscess without bleeding: Secondary | ICD-10-CM

## 2021-06-28 DIAGNOSIS — I10 Essential (primary) hypertension: Secondary | ICD-10-CM

## 2021-06-29 NOTE — Telephone Encounter (Signed)
Requested Prescriptions  Pending Prescriptions Disp Refills  . prazosin (MINIPRESS) 1 MG capsule [Pharmacy Med Name: PRAZOSIN 1MG  CAPSULES] 60 capsule 0    Sig: TAKE 1 CAPSULE(1 MG) BY MOUTH TWICE DAILY     Cardiovascular:  Alpha Blockers Failed - 06/28/2021  3:10 AM      Failed - Last BP in normal range    BP Readings from Last 1 Encounters:  05/29/21 (!) 148/108         Passed - Valid encounter within last 6 months    Recent Outpatient Visits          1 month ago Obstructive sleep apnea of adult   [facility] [name], MD      Future Appointments            In 1 week Zigmund Daniel, Earley Abide, MD [facility], [place]           . lisinopril (ZESTRIL) 40 MG tablet [Pharmacy Med Name: LISINOPRIL 40MG  TABLETS] 60 tablet 0    Sig: TAKE 1 TABLET(40 MG) BY MOUTH IN THE MORNING AND AT BEDTIME     Cardiovascular:  ACE Inhibitors Failed - 06/28/2021  3:10 AM      Failed - Last BP in normal range    BP Readings from Last 1 Encounters:  05/29/21 (!) 148/108         Passed - Cr in normal range and within 180 days    Creatinine, Ser  Date Value Ref Range Status  04/06/2021 0.81 0.61 - 1.24 mg/dL Final         Passed - K in normal range and within 180 days    Potassium  Date Value Ref Range Status  04/06/2021 3.8 3.5 - 5.1 mmol/L Final         Passed - Patient is not pregnant      Passed - Valid encounter within last 6 months    Recent Outpatient Visits          1 month ago Obstructive sleep apnea of adult   [place], MD      Future Appointments            In 1 week Zigmund Daniel, Earley Abide, MD [facility], [facility]           . sucralfate (CARAFATE) 1 g tablet [Pharmacy Med Name: SUCRALFATE 1GM TABLETS] 120 tablet 0    Sig: TAKE 1 TABLET(1 GRAM) BY MOUTH FOUR TIMES DAILY AS NEEDED FOR ABDOMINAL DISCOMFORT OR NAUSEA OR VOMITING     Gastroenterology: Antiacids Passed - 06/28/2021  3:10 AM      Passed - Valid encounter  within last 12 months    Recent Outpatient Visits          1 month ago Obstructive sleep apnea of adult   [facility] [name], MD      Future Appointments            In 1 week Zigmund Daniel, Earley Abide, MD [facility], [place]           . propranolol ER (INDERAL LA) 160 MG SR capsule [Pharmacy Med Name: PROPRANOLOL ER 160MG  CAPSULES] 30 capsule 0    Sig: TAKE 1 CAPSULE(160 MG) BY MOUTH DAILY     Cardiovascular:  Beta Blockers Failed - 06/28/2021  3:10 AM      Failed - Last BP in normal range    BP Readings from Last 1  Encounters:  05/29/21 (!) 148/108         Passed - Last Heart Rate in normal range    Pulse Readings from Last 1 Encounters:  05/29/21 88         Passed - Valid encounter within last 6 months    Recent Outpatient Visits          1 month ago Obstructive sleep apnea of adult   [facility] [name], MD      Future Appointments            In 1 week Zigmund Daniel, Earley Abide, MD [facility], [facility]

## 2021-07-02 ENCOUNTER — Telehealth: Payer: Self-pay

## 2021-07-02 NOTE — Telephone Encounter (Signed)
Copied from [place] [id number]. Topic: General - Other >> Jul 02, 2021  9:44 AM Camille Bal, Gerlene Burdock wrote: Reason for CRM: Elice from [facility] called I states can't get in contact with patient and she is closing his case, her number [phone] ext [phone]

## 2021-07-02 NOTE — Telephone Encounter (Signed)
FYI  KP

## 2021-07-09 ENCOUNTER — Telehealth: Payer: Self-pay

## 2021-07-09 NOTE — Telephone Encounter (Signed)
Left message for Elice to return call. Need more information to what this is referencing.

## 2021-07-09 NOTE — Telephone Encounter (Signed)
Left message for Elice to return my call to see what this is in reference to.  Patient has upcoming appointment with Dr. Zigmund Daniel tomorrow.

## 2021-07-09 NOTE — Telephone Encounter (Signed)
Copied from [place] [id number]. Topic: General - Other >> Jul 09, 2021 11:07 AM Jodie Echevaria wrote: Reason for CRM: Daneil Dan from [place] called to speak to Mark Austin can be reached at [phone] ext [phone]. Per Daneil Dan their phones go straight to VM because of the high call volume so asking Mark Austin to [facility] with exact time she is available and she will call back then

## 2021-07-09 NOTE — Telephone Encounter (Signed)
See other open telephone encounter.

## 2021-07-11 ENCOUNTER — Other Ambulatory Visit: Payer: Self-pay

## 2021-07-11 ENCOUNTER — Ambulatory Visit (INDEPENDENT_AMBULATORY_CARE_PROVIDER_SITE_OTHER): Payer: Managed Care, Other (non HMO) | Admitting: Family Medicine

## 2021-07-11 ENCOUNTER — Encounter: Payer: Self-pay | Admitting: Family Medicine

## 2021-07-11 VITALS — BP 140/90 | HR 80 | Ht 70.0 in | Wt 225.0 lb

## 2021-07-11 DIAGNOSIS — Z0000 Encounter for general adult medical examination without abnormal findings: Secondary | ICD-10-CM | POA: Insufficient documentation

## 2021-07-11 DIAGNOSIS — M79605 Pain in left leg: Secondary | ICD-10-CM

## 2021-07-11 DIAGNOSIS — G2581 Restless legs syndrome: Secondary | ICD-10-CM

## 2021-07-11 DIAGNOSIS — G8929 Other chronic pain: Secondary | ICD-10-CM | POA: Insufficient documentation

## 2021-07-11 DIAGNOSIS — Z13220 Encounter for screening for lipoid disorders: Secondary | ICD-10-CM | POA: Diagnosis not present

## 2021-07-11 DIAGNOSIS — Z1159 Encounter for screening for other viral diseases: Secondary | ICD-10-CM | POA: Diagnosis not present

## 2021-07-11 DIAGNOSIS — M25561 Pain in right knee: Secondary | ICD-10-CM

## 2021-07-11 DIAGNOSIS — Z114 Encounter for screening for human immunodeficiency virus [HIV]: Secondary | ICD-10-CM | POA: Diagnosis not present

## 2021-07-11 DIAGNOSIS — M25562 Pain in left knee: Secondary | ICD-10-CM

## 2021-07-11 DIAGNOSIS — Z23 Encounter for immunization: Secondary | ICD-10-CM | POA: Insufficient documentation

## 2021-07-11 DIAGNOSIS — E559 Vitamin D deficiency, unspecified: Secondary | ICD-10-CM

## 2021-07-11 DIAGNOSIS — M79604 Pain in right leg: Secondary | ICD-10-CM | POA: Insufficient documentation

## 2021-07-11 MED ORDER — ONDANSETRON 4 MG PO TBDP: ORAL_TABLET | ORAL | 0 refills | Status: DC

## 2021-07-11 NOTE — Assessment & Plan Note (Signed)
Annual examination completed, risk stratification labs ordered, anticipatory guidance provided.  We will follow labs once resulted. 

## 2021-07-11 NOTE — Progress Notes (Signed)
Annual Physical Exam Visit  Patient Information:  Patient ID: Mark Austin, male DOB: [birth year] Age: 40 y.o. MRN: [id number]   Subjective:   CC: Annual Physical Exam  HPI:  Mark Austin is here for their annual physical.  I reviewed the past medical history, family history, social history, surgical history, and allergies today and changes were made as necessary.  Please see the problem list section below for additional details.  Past Medical History: Past Medical History:  Diagnosis Date   Back ache    Diverticulitis    GERD (gastroesophageal reflux disease)    History of kidney stones    Hypertension    Inguinal hernia    Seizure ([place])    over 20 yrs ago.  related to medications   Past Surgical History: Past Surgical History:  Procedure Laterality Date   back surgeries     x5 lumbar L4L5   BACK SURGERY     BOWEL RESECTION  2016   CHOLECYSTECTOMY  2017   COLON SURGERY     COLONOSCOPY     COLONOSCOPY WITH PROPOFOL N/A 03/19/2021   Procedure: COLONOSCOPY WITH BIOPSY;  Surgeon: Lucilla Lame, MD;  Location: [place];  Service: Endoscopy;  Laterality: N/A;   EXCISION MASS LOWER EXTREMETIES Left 11/30/2019   Procedure: EXCISION MASS LOWER EXTREMETIES, Left Hip;  Surgeon: Jules Husbands, MD;  Location: [facility];  Service: General;  Laterality: Left;   HERNIA REPAIR     INGUINAL HERNIA REPAIR Left    POLYPECTOMY N/A 03/19/2021   Procedure: POLYPECTOMY;  Surgeon: Lucilla Lame, MD;  Location: [place];  Service: Endoscopy;  Laterality: N/A;   SHOULDER SURGERY Right    rotator cuff tear   XI ROBOTIC ASSISTED INGUINAL HERNIA REPAIR WITH MESH Right 10/08/2019   Procedure: XI ROBOTIC ASSISTED INGUINAL HERNIA REPAIR WITH MESH;  Surgeon: Jules Husbands, MD;  Location: [facility];  Service: General;  Laterality: Right;   Family History: Family History  Problem Relation Age of Onset   Healthy Mother    Healthy Father    Allergies: Allergies  Allergen  Reactions   Ketorolac Tromethamine Anaphylaxis    Edema of the tongue, Edema, Swelling, Seizure   Lidocaine Other (See Comments)    Intolerant to lidocaine patches: Seizure   Tramadol Other (See Comments)    Seizures    Atorvastatin Other (See Comments)    Muscle aches   Brassica Oleracea Hives    "Cauliflower"   Carisoprodol Other (See Comments)    Sedation   Carvedilol Other (See Comments)    Headache   Gabapentin Other (See Comments)    "Feels like skin is wanting to be removed from body"   Rosuvastatin Other (See Comments)    Muscle aches   Health Maintenance: Health Maintenance  Topic Date Due   Hepatitis C Screening  Never done   COVID-19 Vaccine (4 - Booster for Moderna series) 04/03/2021   COLONOSCOPY (Pts 45-42yrs Insurance coverage will need to be confirmed)  03/19/2026   TETANUS/TDAP  03/08/2029   Pneumococcal Vaccine 59-23 Years old  Completed   INFLUENZA VACCINE  Completed   HIV Screening  Completed   HPV VACCINES  Aged Out    HM Colonoscopy           COLONOSCOPY (Pts 45-74yrs Insurance coverage will need to be confirmed) (Every 5 Years) Next due on 03/19/2026    03/19/2021  COLONOSCOPY   Only the first 1 history entries have been loaded, but  more history exists.           Medications: Current Outpatient Medications on File Prior to Visit  Medication Sig Dispense Refill   anastrozole (ARIMIDEX) 1 MG tablet Take 1 mg by mouth once a week.     aspirin 81 MG EC tablet Take 81 mg by mouth daily.     carisoprodol (SOMA) 250 MG tablet Take 250 mg by mouth 3 (three) times daily as needed.     cyclobenzaprine (FLEXERIL) 10 MG tablet Take 10 mg by mouth 3 (three) times daily as needed.     dicyclomine (BENTYL) 10 MG capsule Take 1 capsule (10 mg total) by mouth 3 (three) times daily as needed for up to 14 days for spasms. or abdominal pain 30 capsule 0   ezetimibe (ZETIA) 10 MG tablet Take 10 mg by mouth daily.     Famotidine (ZANTAC 360 PO) Take 1 tablet by  mouth as needed.     LATUDA 20 MG TABS tablet Take 20 mg by mouth daily.     lisinopril (ZESTRIL) 40 MG tablet TAKE 1 TABLET(40 MG) BY MOUTH IN THE MORNING AND AT BEDTIME 60 tablet 0   prazosin (MINIPRESS) 1 MG capsule TAKE 1 CAPSULE(1 MG) BY MOUTH TWICE DAILY 60 capsule 0   propranolol ER (INDERAL LA) 160 MG SR capsule TAKE 1 CAPSULE(160 MG) BY MOUTH DAILY 30 capsule 0   senna-docusate (SENOKOT-S) 8.6-50 MG tablet Take 2 tablets by mouth as needed. 60 tablet 0   spironolactone (ALDACTONE) 25 MG tablet Take 25 mg by mouth 2 (two) times daily.     sucralfate (CARAFATE) 1 g tablet TAKE 1 TABLET(1 GRAM) BY MOUTH FOUR TIMES DAILY AS NEEDED FOR ABDOMINAL DISCOMFORT OR NAUSEA OR VOMITING 120 tablet 0   testosterone cypionate (DEPOTESTOTERONE CYPIONATE) 100 MG/ML injection Inject 200 mg into the muscle every 7 (seven) days. For IM use only  Thursday's     varenicline (CHANTIX) 1 MG tablet Take 1 mg by mouth 2 (two) times daily.     methylphenidate (RITALIN LA) 30 MG 24 hr capsule Take 30 mg by mouth every morning. (Patient not taking: Reported on 07/11/2021)     methylphenidate (RITALIN) 20 MG tablet Take 20 mg by mouth daily. (Patient not taking: Reported on 07/11/2021)     morphine (MSIR) 15 MG tablet Take 15 mg by mouth in the morning and at bedtime. (Patient not taking: Reported on 07/11/2021)     tapentadol (NUCYNTA) 100 MG 12 hr tablet Take 100 mg by mouth in the morning and at bedtime.  (Patient not taking: Reported on 07/11/2021)     No current facility-administered medications on file prior to visit.    Review of Systems: No headache, visual changes, nausea, vomiting, diarrhea, constipation, +dizziness, abdominal pain, skin rash, fevers, chills, night sweats, swollen lymph nodes, weight loss, chest pain, body aches, joint swelling, +muscle aches, shortness of breath, +mood changes, visual or auditory hallucinations reported.  Objective:   Vitals:   07/11/21 1005 07/11/21 1033  BP: (!)  158/98 140/90  Pulse: 80   SpO2: 96%    Vitals:   07/11/21 1005  Weight: 225 lb (102.1 kg)  Height: 5\' 10"  (1.778 m)   Body mass index is 32.28 kg/m.  General: Well Developed, well nourished, and in no acute distress.  Neuro: Alert and oriented x3, extra-ocular muscles intact, sensation grossly intact. Cranial nerves II through XII are grossly intact, motor, sensory, and coordinative functions are intact. HEENT: Normocephalic, atraumatic, pupils equal  round reactive to light, neck supple, no masses, no lymphadenopathy, thyroid nonpalpable. Oropharynx, nasopharynx, external ear canals are unremarkable. Skin: Warm and dry, no rashes noted.  Cardiac: Regular rate and rhythm, no murmurs rubs or gallops. No peripheral edema. Pulses symmetric. Respiratory: Clear to auscultation bilaterally. Not using accessory muscles, speaking in full sentences.  Abdominal: Soft, nontender, nondistended, positive bowel sounds, no masses, no organomegaly. Musculoskeletal: Shoulder, elbow, wrist, hip, knee, ankle stable, and with full range of motion.  Impression and Recommendations:   The patient was counselled, risk factors were discussed, and anticipatory guidance given.  Annual physical exam Annual examination completed, risk stratification labs ordered, anticipatory guidance provided.  We will follow labs once resulted.  Restless legs Serum studies ordered, can be considered du eto low iron, lumbar pathology, x-rays ordered as well. Will follow results.  Bilateral leg pain See additional assessment(s) for plan details.  Orders & Medications Medications:  Meds ordered this encounter  Medications   ondansetron (ZOFRAN ODT) 4 MG disintegrating tablet    Sig: Allow 1-2 tablets to dissolve in your mouth every 8 hours as needed for nausea/vomiting    Dispense:  30 tablet    Refill:  0   Orders Placed This Encounter  Procedures   DG Knee Complete 4 Views Right   DG Knee Complete 4 Views Left    DG Lumbar Spine Complete   TSH Rfx on Abnormal to Free T4   Apo A1 + B + Ratio   Iron, TIBC and Ferritin Panel   Lipid panel   Comprehensive metabolic panel   CBC with Differential/Platelet   Hepatitis C antibody   HIV Antibody (routine testing w rflx)   VITAMIN D 25 Hydroxy (Vit-D Deficiency, Fractures)     Return in about 4 weeks (around 08/08/2021) for 2-4 weeks for musculoskeletal pain.    [name], MD   Primary Care Sports Medicine [place]

## 2021-07-11 NOTE — Assessment & Plan Note (Signed)
Serum studies ordered, can be considered du eto low iron, lumbar pathology, x-rays ordered as well. Will follow results.

## 2021-07-11 NOTE — Assessment & Plan Note (Signed)
See additional assessment(s) for plan details. 

## 2021-07-11 NOTE — Telephone Encounter (Signed)
Patient seen in the office today for CPE.  Patient given information to contact Elice.  Nothing further needed for now.

## 2021-07-11 NOTE — Patient Instructions (Signed)
[facility] [phone]

## 2021-07-12 ENCOUNTER — Ambulatory Visit
Admission: RE | Admit: 2021-07-12 | Discharge: 2021-07-12 | Disposition: A | Discharge status: Home / Self Care | Payer: Managed Care, Other (non HMO) | Source: Ambulatory Visit | Attending: Family Medicine | Admitting: Family Medicine

## 2021-07-12 DIAGNOSIS — G2581 Restless legs syndrome: Secondary | ICD-10-CM

## 2021-07-12 DIAGNOSIS — M79604 Pain in right leg: Secondary | ICD-10-CM

## 2021-07-17 ENCOUNTER — Ambulatory Visit
Admission: RE | Admit: 2021-07-17 | Discharge: 2021-07-17 | Disposition: A | Discharge status: Home / Self Care | Payer: Managed Care, Other (non HMO) | Attending: Family Medicine | Admitting: Family Medicine

## 2021-07-17 ENCOUNTER — Ambulatory Visit
Admission: RE | Admit: 2021-07-17 | Discharge: 2021-07-17 | Disposition: A | Discharge status: Home / Self Care | Payer: Managed Care, Other (non HMO) | Source: Ambulatory Visit | Attending: Family Medicine | Admitting: Family Medicine

## 2021-07-17 ENCOUNTER — Other Ambulatory Visit: Payer: Self-pay

## 2021-07-17 DIAGNOSIS — G8929 Other chronic pain: Secondary | ICD-10-CM | POA: Diagnosis present

## 2021-07-17 DIAGNOSIS — G2581 Restless legs syndrome: Secondary | ICD-10-CM | POA: Insufficient documentation

## 2021-07-17 DIAGNOSIS — M25561 Pain in right knee: Secondary | ICD-10-CM | POA: Insufficient documentation

## 2021-07-17 DIAGNOSIS — M79605 Pain in left leg: Secondary | ICD-10-CM | POA: Insufficient documentation

## 2021-07-17 DIAGNOSIS — M25562 Pain in left knee: Secondary | ICD-10-CM | POA: Diagnosis present

## 2021-07-17 DIAGNOSIS — M79604 Pain in right leg: Secondary | ICD-10-CM | POA: Insufficient documentation

## 2021-07-17 IMAGING — CR DG LUMBAR SPINE COMPLETE 4+V
5 series · 5 of 5 positions shown · non-contrast
Comparison: CT [DATE].  MRI lumbar spine [DATE].

CLINICAL DATA: Low back pain with tingling in both legs. History of
back surgery.

EXAM:
LUMBAR SPINE - COMPLETE 4+ VIEW

[l-spine ap]
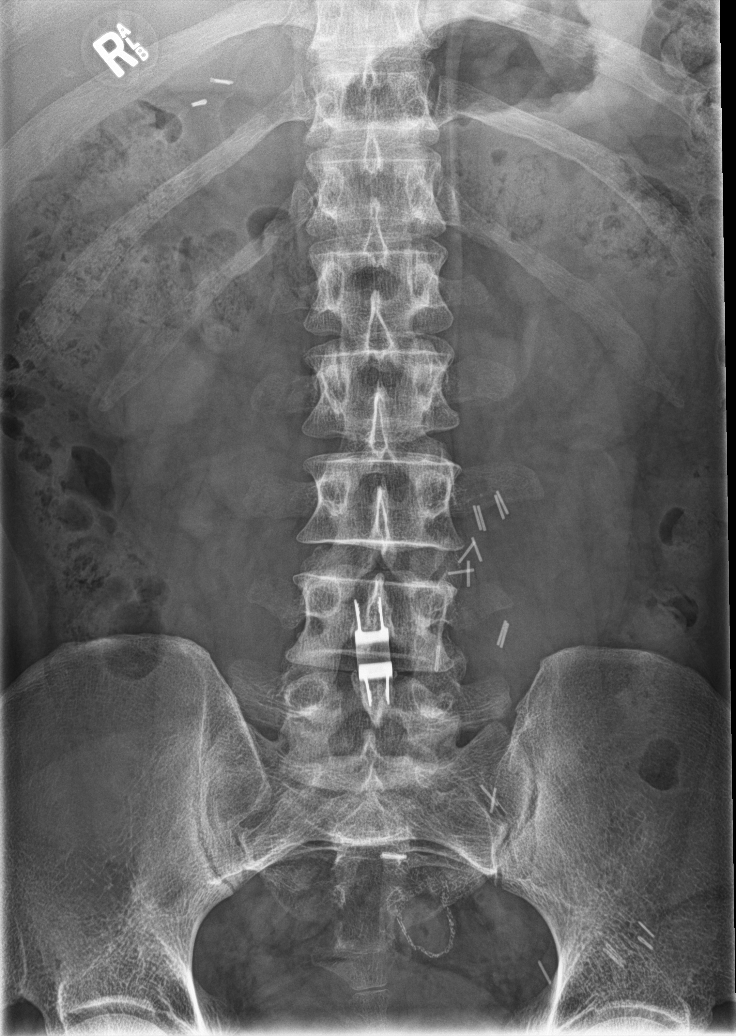

[l-spine obl (1 of 2)]
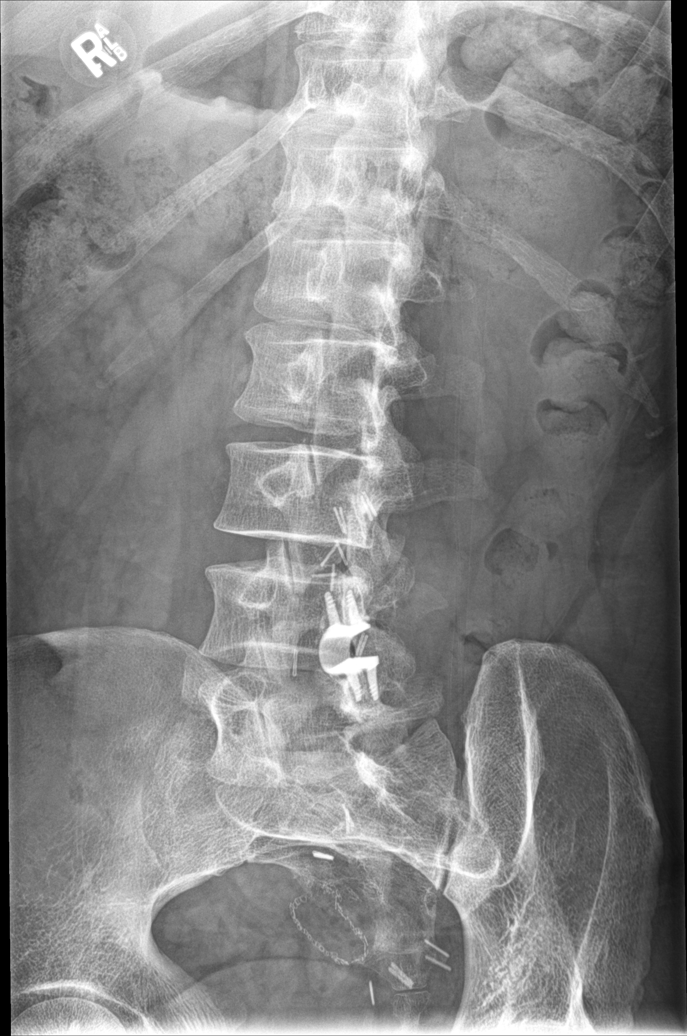

[l-spine obl (2 of 2)]
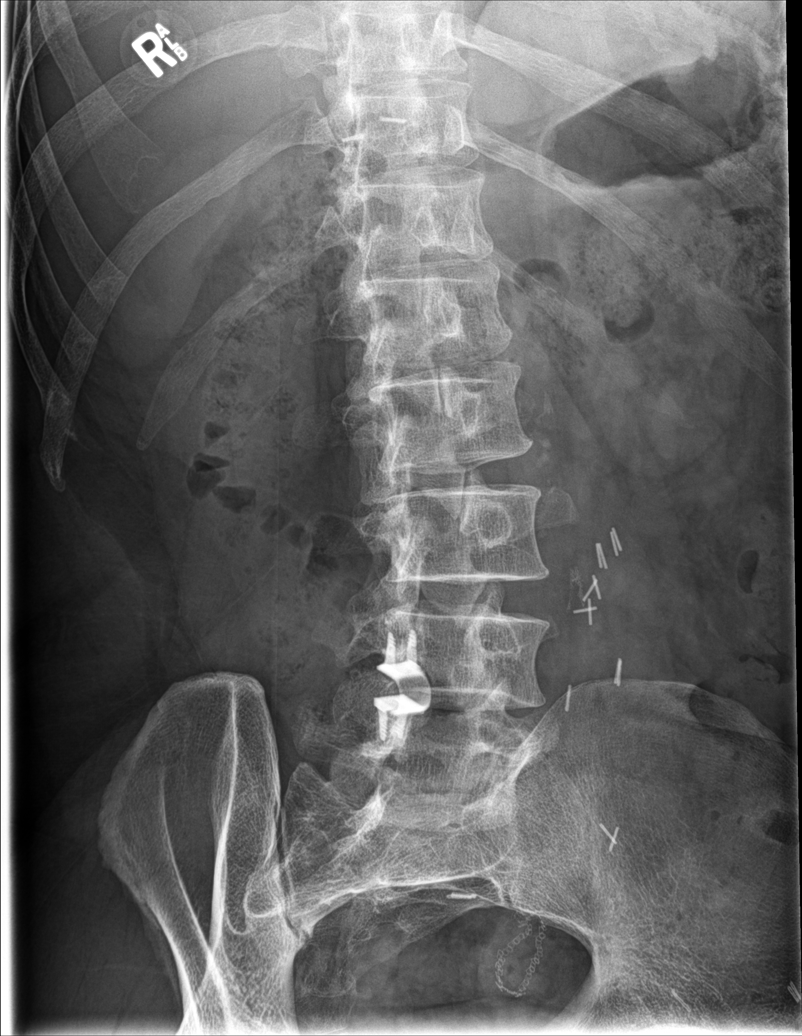

[l-spine lat]
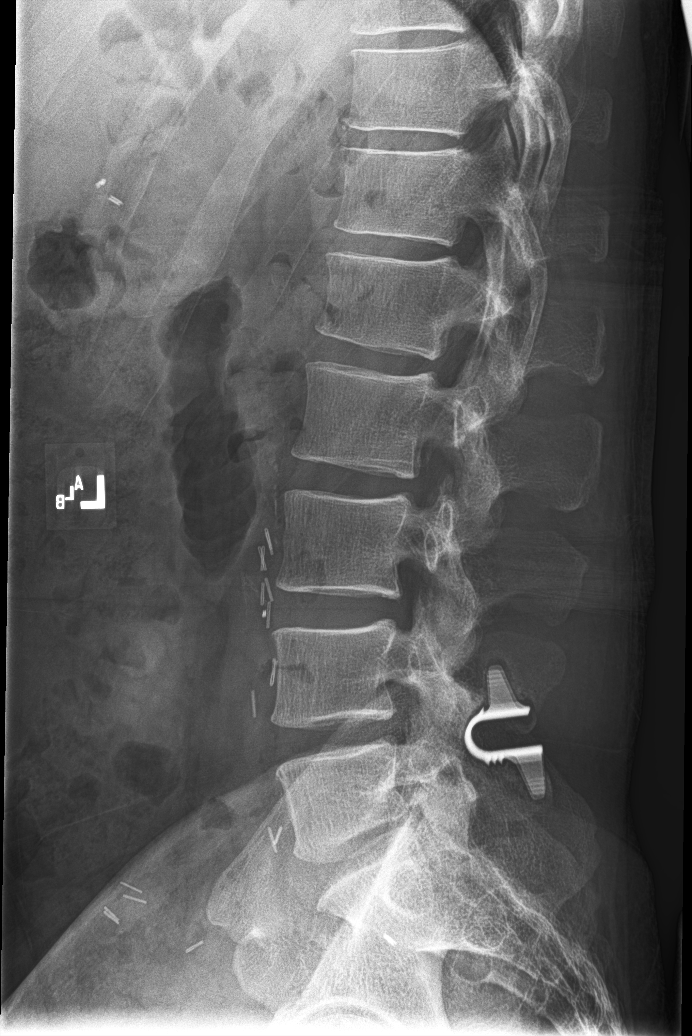

[l-spine spot]
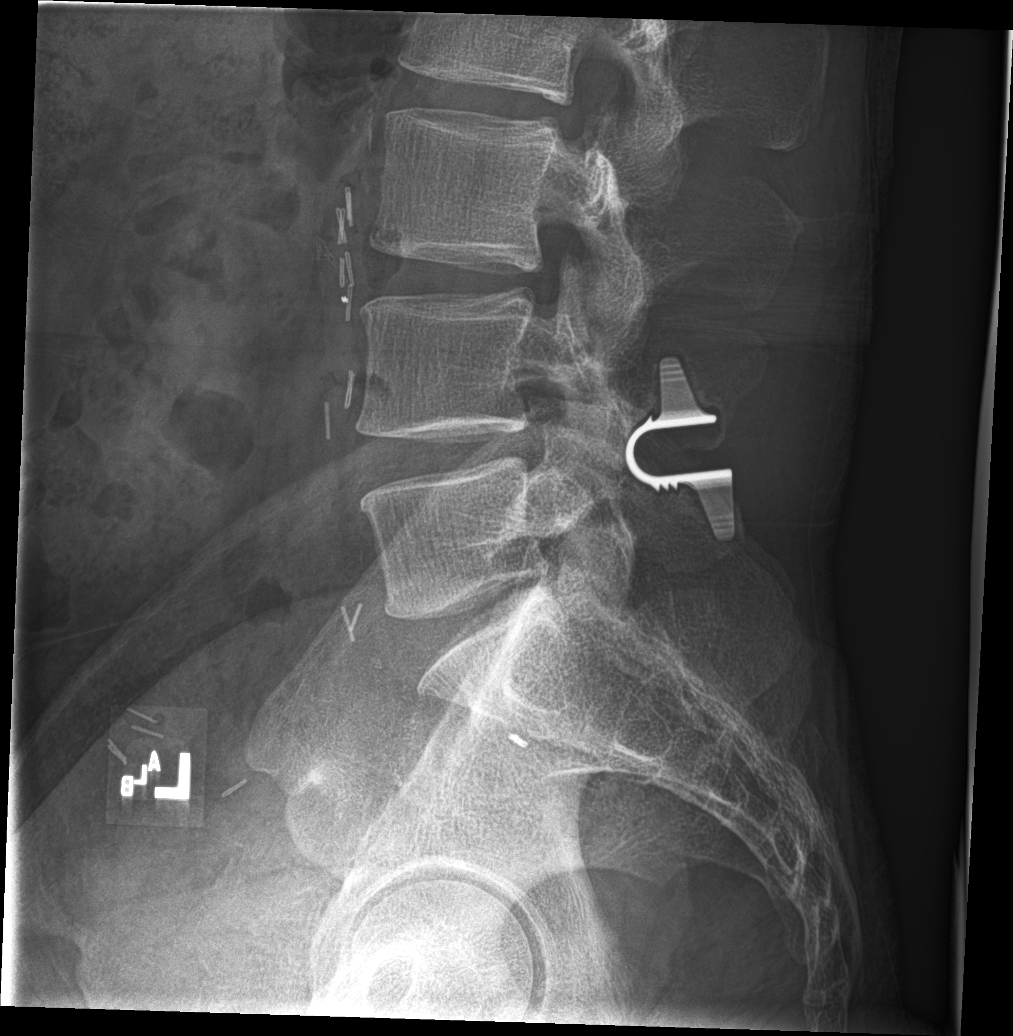

[5 of 5 positions shown; findings below may reference images not displayed]

FINDINGS: Lumbar spine numbered the lowest segmented appearing lumbar shaped
vertebrae on lateral view as L5. L4-L5 spinous process fusion again
noted. Loosening of the prosthetic device cannot be excluded.
Diffuse mild degenerative change. No acute bony abnormality.
Surgical clips and sutures noted throughout the abdomen and pelvis.
Moderate stool volume.
IMPRESSION: 1. L4-L5 spinous process fusion again noted. Loosening of the
prosthetic device cannot be excluded.

2.  Diffuse mild degenerative change.  No acute bony abnormality.

3.  Moderate stool volume.

## 2021-07-17 IMAGING — CR DG KNEE COMPLETE 4+V*R*
5 series · 5 of 5 positions shown · non-contrast
Comparison: Prior right knee series report [DATE].

CLINICAL DATA: History of bilateral knee pain.

EXAM:
RIGHT KNEE - COMPLETE 4+ VIEW

[knee ap]
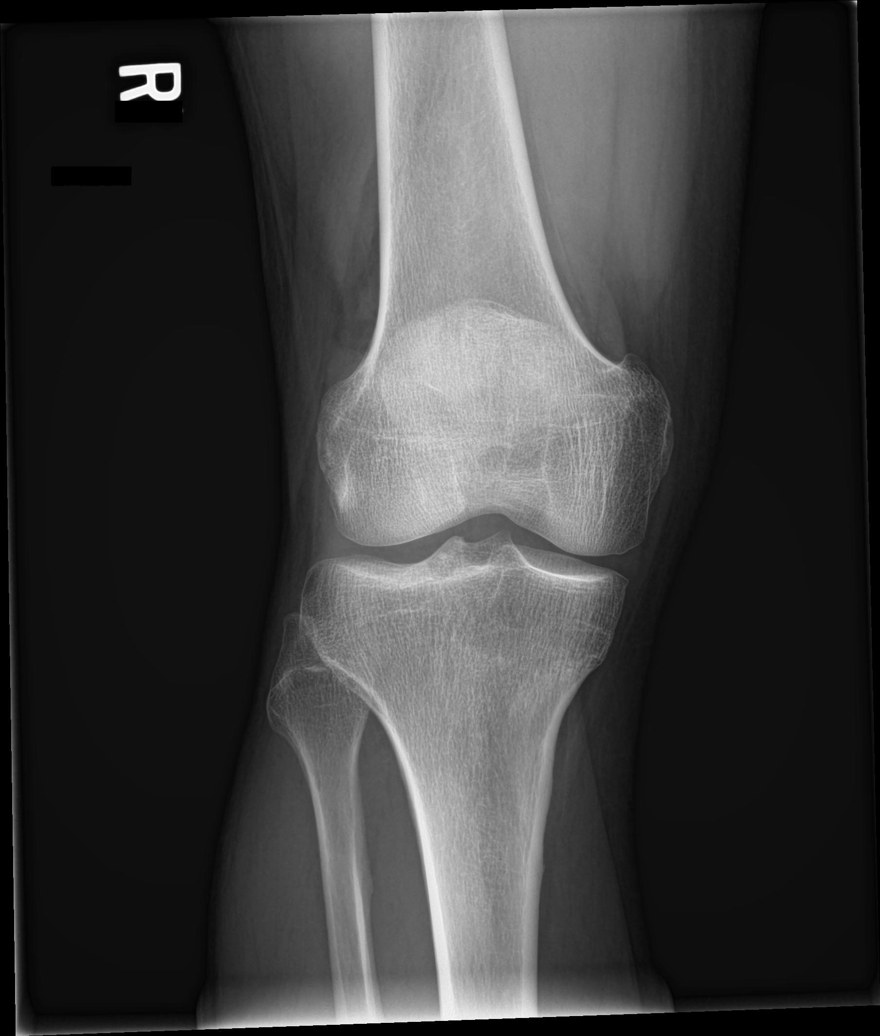

[knee lat]
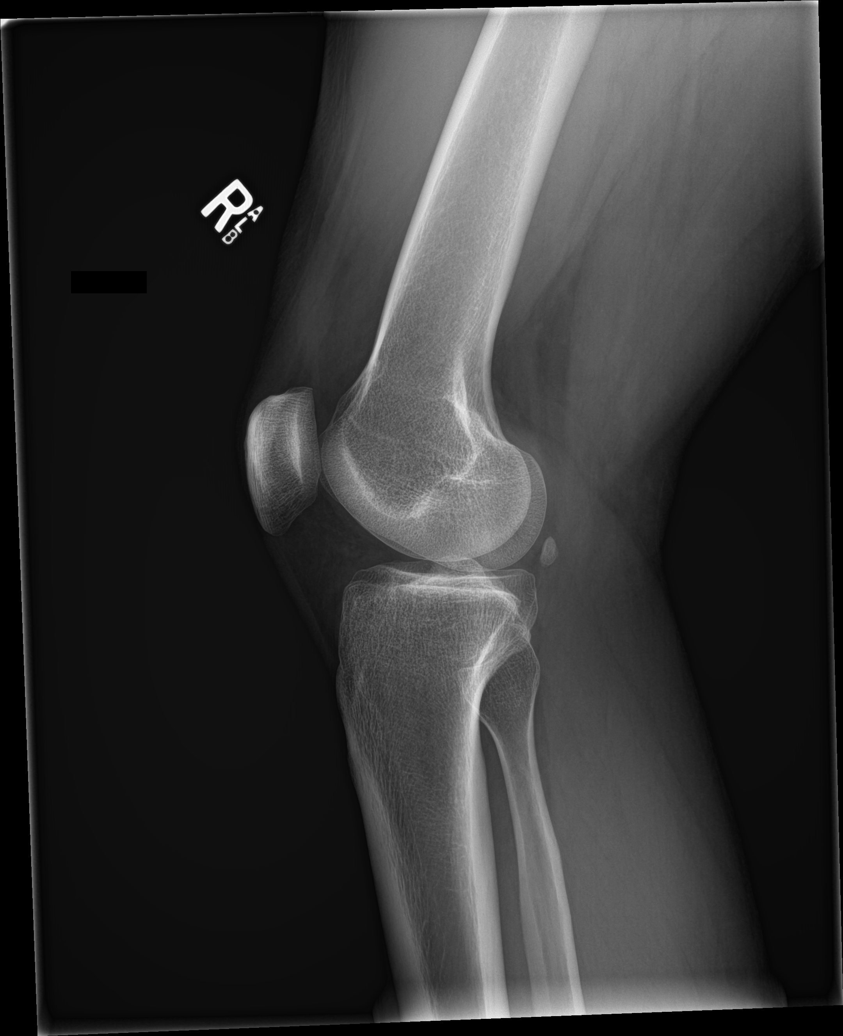

[tunnel]
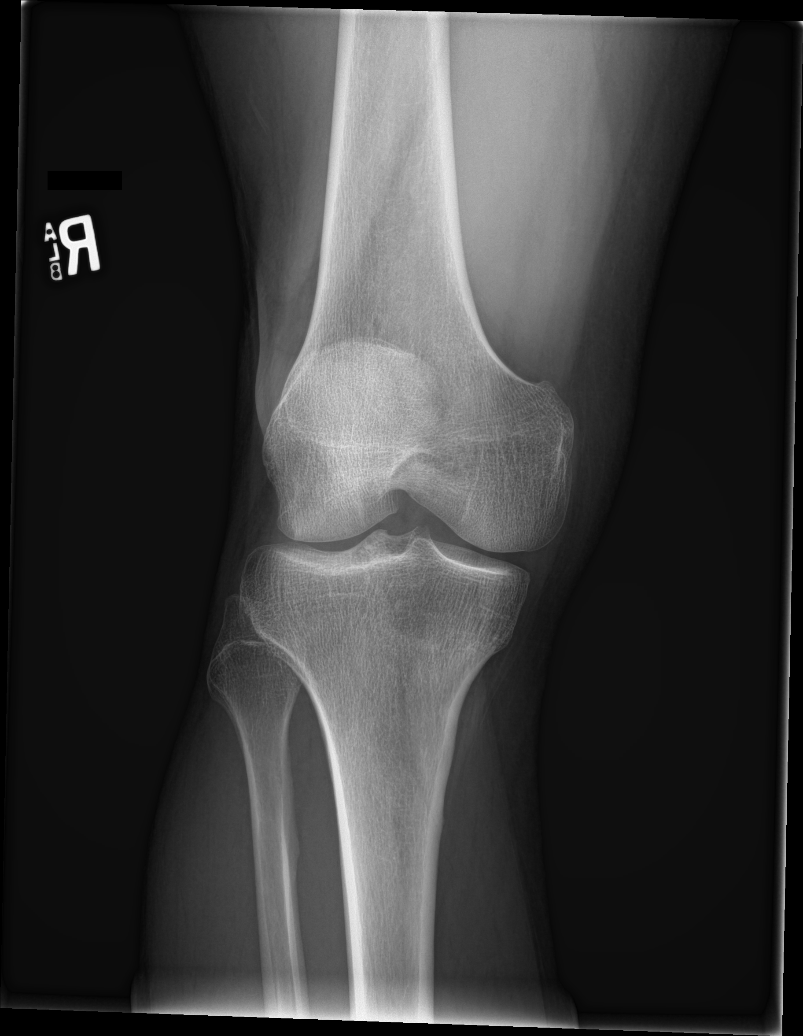

[patella skyline (1 of 2)]
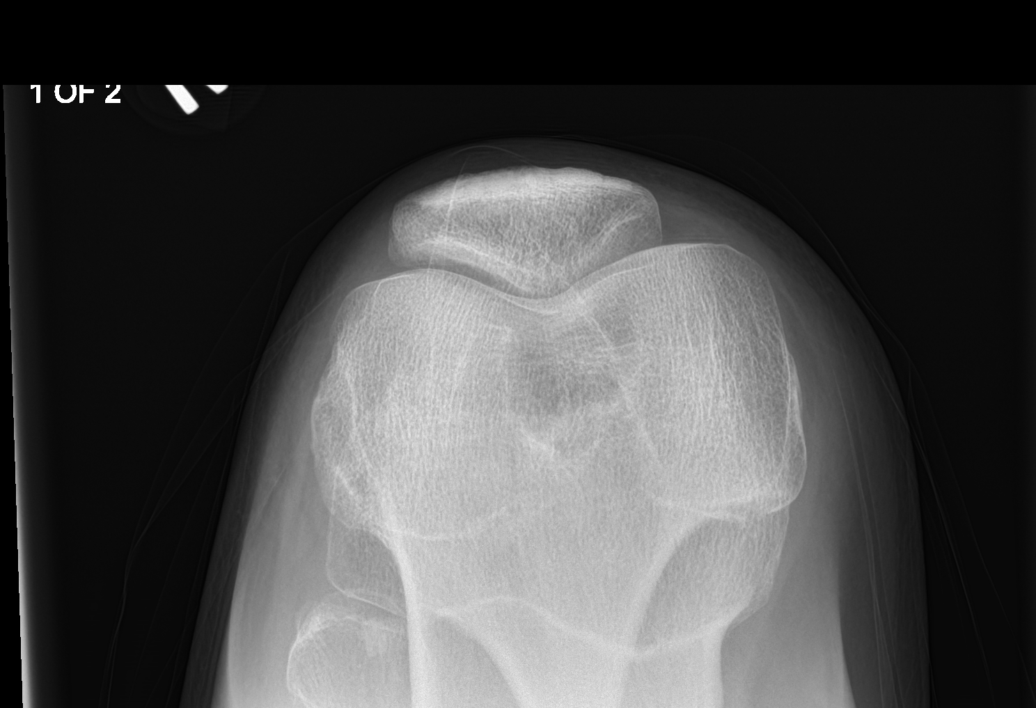

[patella skyline (2 of 2)]
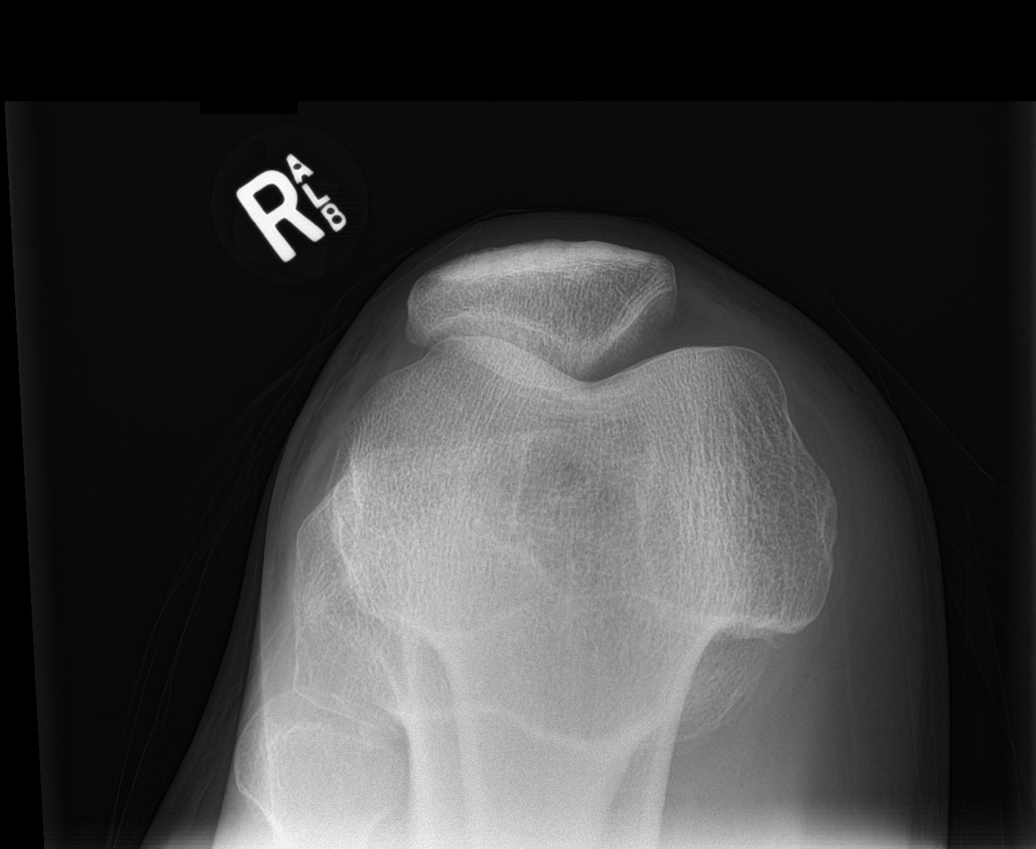

[5 of 5 positions shown; findings below may reference images not displayed]

FINDINGS: Mild lateral subluxation of the tibia, this may be degenerative. No
evidence of fracture or dislocation. No evidence of effusion.
IMPRESSION: Mild lateral subluxation of the tibia, this may be degenerative. No
evidence of fracture or dislocation.

## 2021-07-17 IMAGING — CR DG KNEE COMPLETE 4+V*L*
4 series · 4 of 4 positions shown · non-contrast
Comparison: Left knee series report [DATE].

CLINICAL DATA: History of knee pain.

EXAM:
LEFT KNEE - COMPLETE 4+ VIEW

[knee ap]
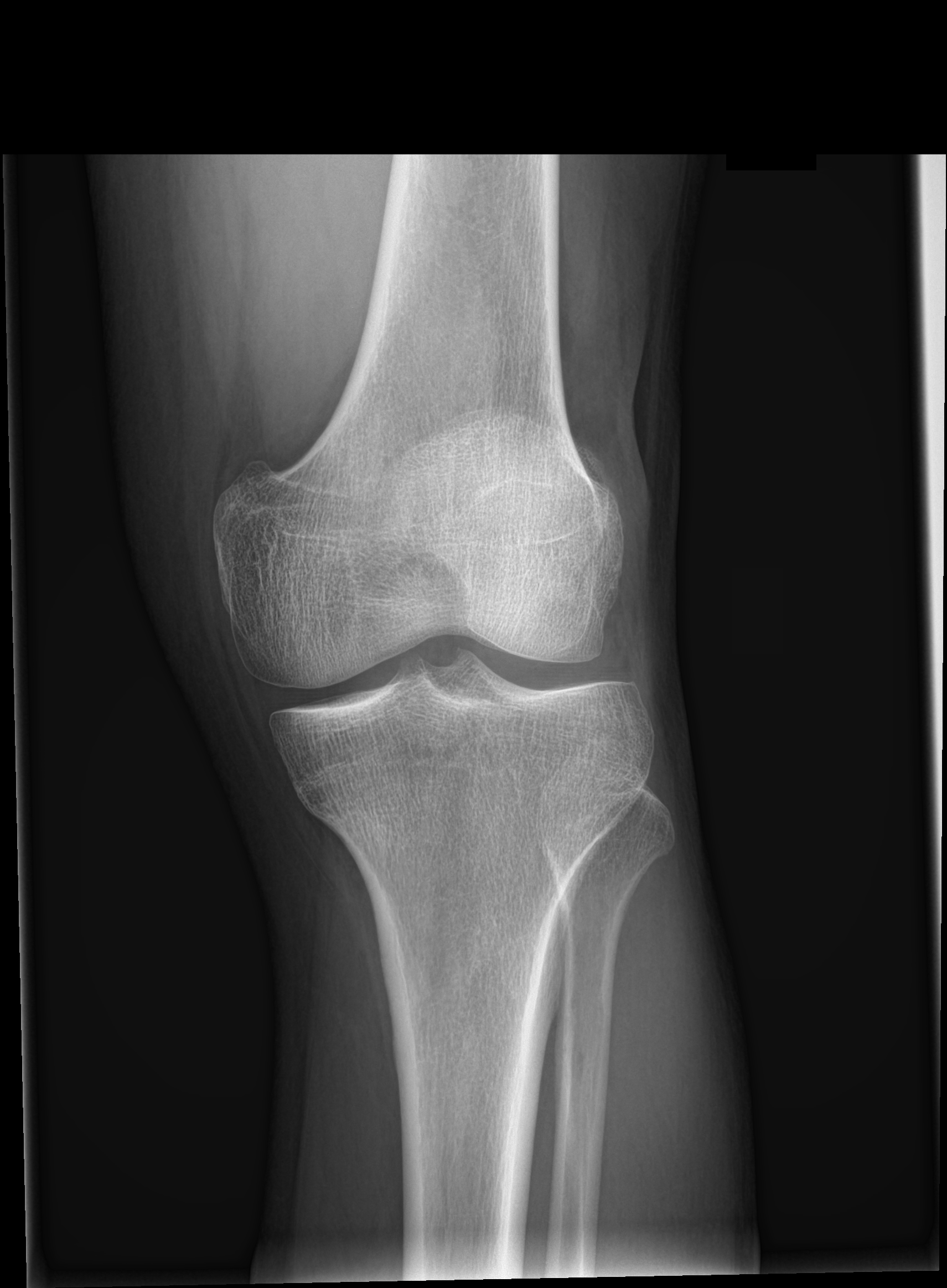

[knee lat]
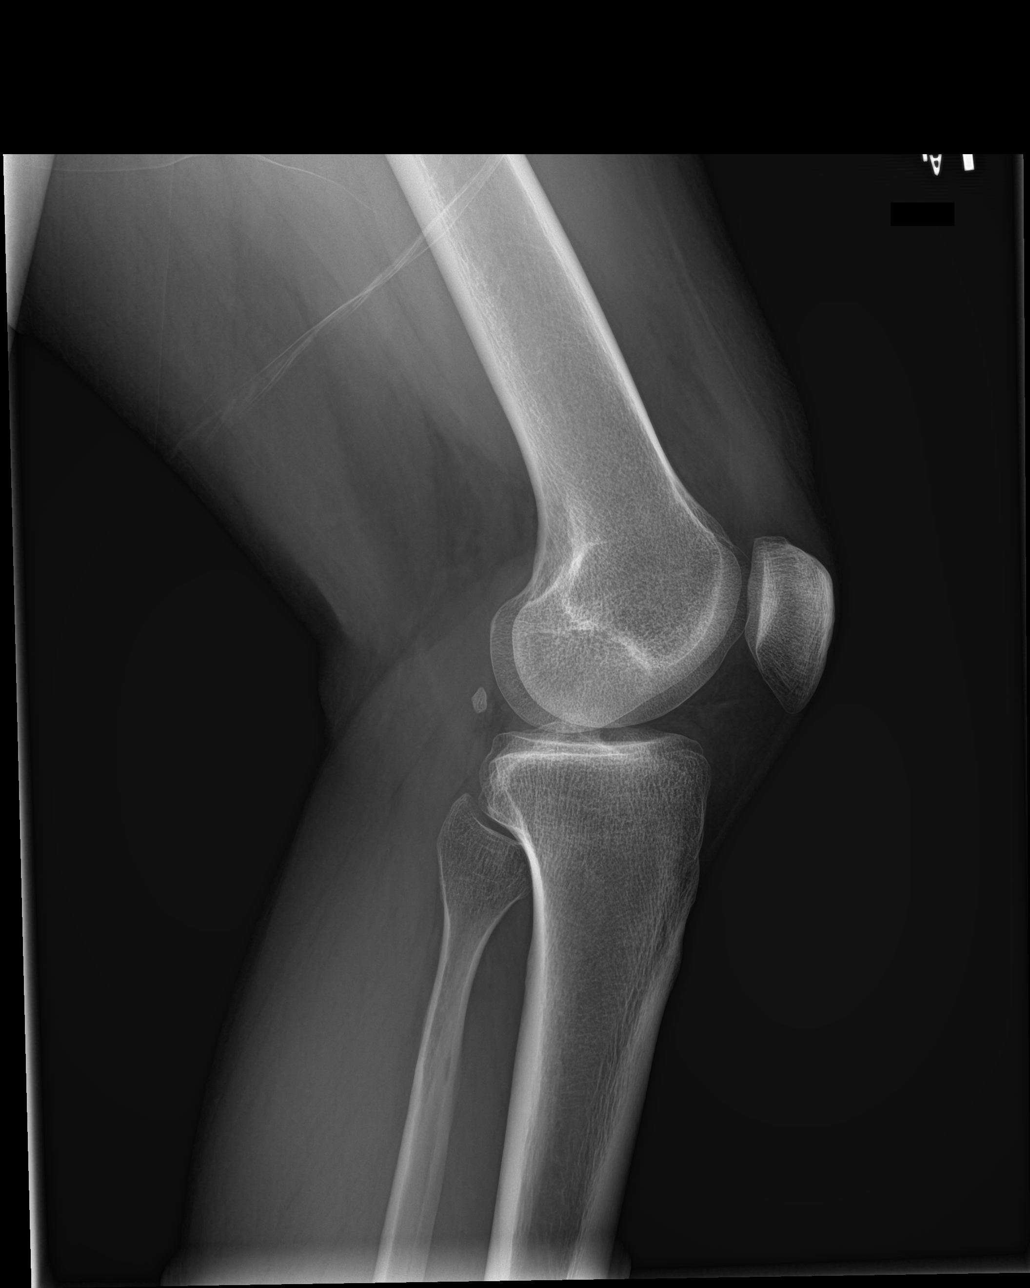

[tunnel]
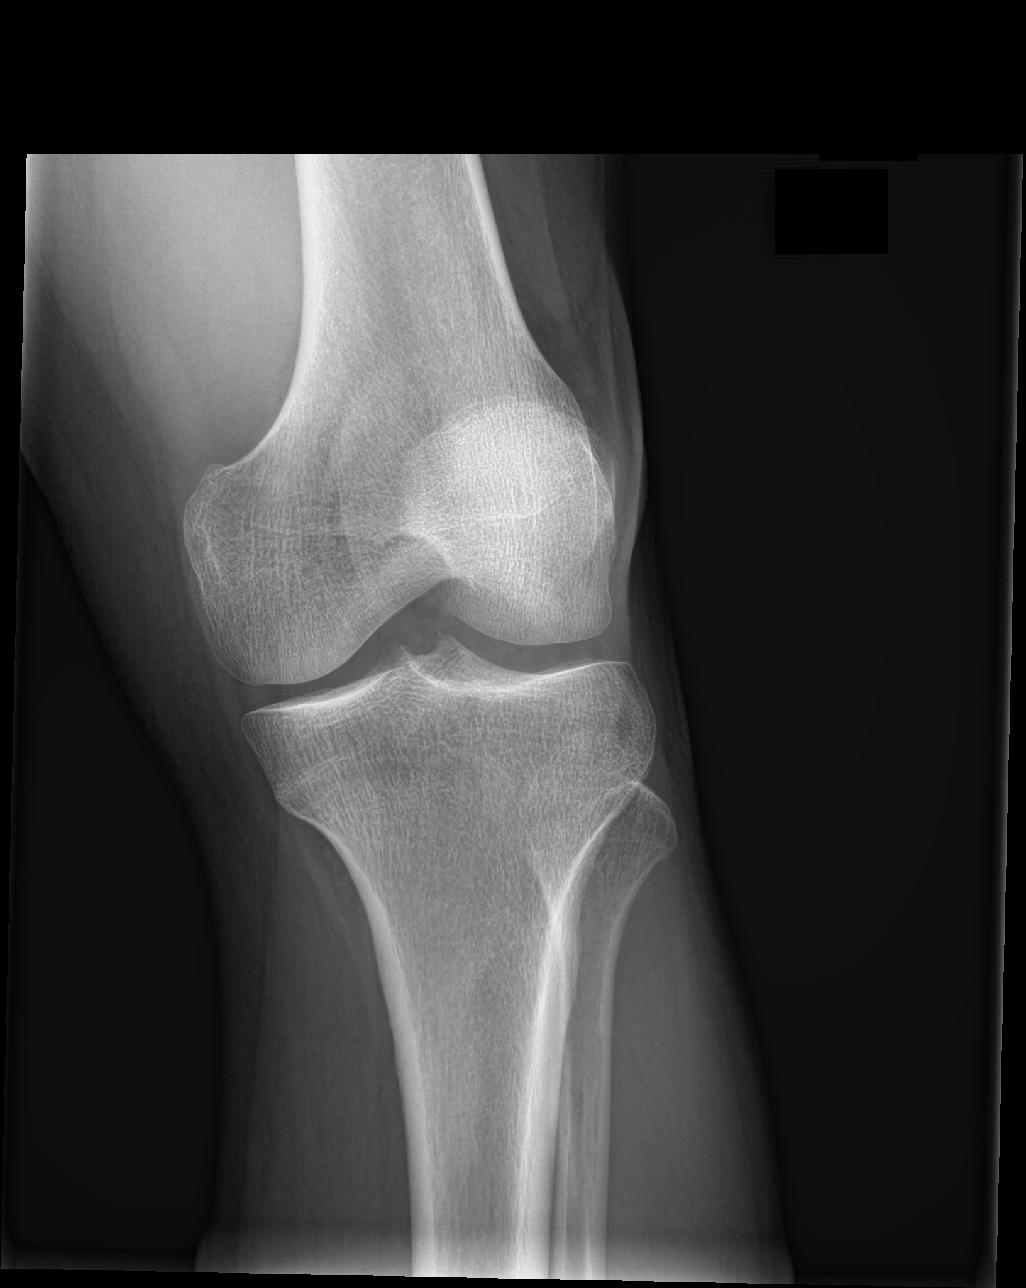

[patella skyline]
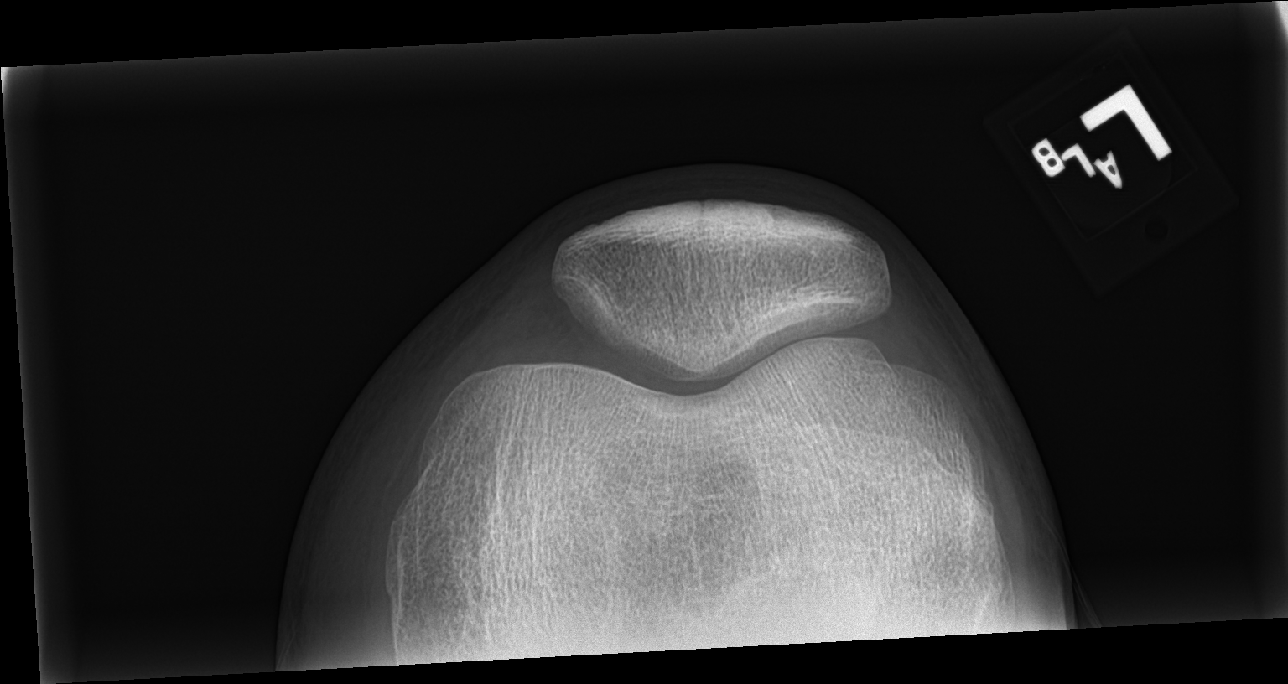

[4 of 4 positions shown; findings below may reference images not displayed]

FINDINGS: Mild lateral subluxation of the tibia, this may be degenerative. No
acute bony or joint abnormality. No evidence of effusion.
IMPRESSION: Mild lateral subluxation of the tibia, this may be degenerative. No
acute bony or joint abnormality identified.

## 2021-07-20 ENCOUNTER — Other Ambulatory Visit: Payer: Self-pay | Admitting: Family Medicine

## 2021-07-20 DIAGNOSIS — E559 Vitamin D deficiency, unspecified: Secondary | ICD-10-CM

## 2021-07-20 LAB — CBC WITH DIFFERENTIAL/PLATELET
Basophils Absolute: 0 10*3/uL (ref 0.0–0.2)
Basos: 0 %
EOS (ABSOLUTE): 0.1 10*3/uL (ref 0.0–0.4)
Eos: 1 %
Hematocrit: 52.6 % — ABNORMAL HIGH (ref 37.5–51.0)
Hemoglobin: 17.9 g/dL — ABNORMAL HIGH (ref 13.0–17.7)
Immature Grans (Abs): 0 10*3/uL (ref 0.0–0.1)
Immature Granulocytes: 0 %
Lymphocytes Absolute: 2.8 10*3/uL (ref 0.7–3.1)
Lymphs: 29 %
MCH: 30.7 pg (ref 26.6–33.0)
MCHC: 34 g/dL (ref 31.5–35.7)
MCV: 90 fL (ref 79–97)
Monocytes Absolute: 0.5 10*3/uL (ref 0.1–0.9)
Monocytes: 6 %
Neutrophils Absolute: 6.1 10*3/uL (ref 1.4–7.0)
Neutrophils: 64 %
Platelets: 214 10*3/uL (ref 150–450)
RBC: 5.84 x10E6/uL — ABNORMAL HIGH (ref 4.14–5.80)
RDW: 11.7 % (ref 11.6–15.4)
WBC: 9.6 10*3/uL (ref 3.4–10.8)

## 2021-07-20 LAB — LIPID PANEL
Chol/HDL Ratio: 4.6 ratio (ref 0.0–5.0)
Cholesterol, Total: 180 mg/dL (ref 100–199)
HDL: 39 mg/dL — ABNORMAL LOW (ref >39)
LDL Chol Calc (NIH): 123 mg/dL — ABNORMAL HIGH (ref 0–99)
Triglycerides: 95 mg/dL (ref 0–149)
VLDL Cholesterol Cal: 18 mg/dL (ref 5–40)

## 2021-07-20 LAB — IRON,TIBC AND FERRITIN PANEL
Ferritin: 73 ng/mL (ref 30–400)
Iron Saturation: 31 % (ref 15–55)
Iron: 100 ug/dL (ref 38–169)
Total Iron Binding Capacity: 325 ug/dL (ref 250–450)
UIBC: 225 ug/dL (ref 111–343)

## 2021-07-20 LAB — COMPREHENSIVE METABOLIC PANEL
ALT: 13 IU/L (ref 0–44)
AST: 18 IU/L (ref 0–40)
Albumin/Globulin Ratio: 1.8 (ref 1.2–2.2)
Albumin: 4.6 g/dL (ref 4.0–5.0)
Alkaline Phosphatase: 59 IU/L (ref 44–121)
BUN/Creatinine Ratio: 11 (ref 9–20)
BUN: 10 mg/dL (ref 6–24)
Bilirubin Total: 0.5 mg/dL (ref 0.0–1.2)
CO2: 23 mmol/L (ref 20–29)
Calcium: 9.6 mg/dL (ref 8.7–10.2)
Chloride: 101 mmol/L (ref 96–106)
Creatinine, Ser: 0.93 mg/dL (ref 0.76–1.27)
Globulin, Total: 2.6 g/dL (ref 1.5–4.5)
Glucose: 81 mg/dL (ref 70–99)
Potassium: 4.2 mmol/L (ref 3.5–5.2)
Sodium: 137 mmol/L (ref 134–144)
Total Protein: 7.2 g/dL (ref 6.0–8.5)
eGFR: 106 mL/min/{1.73_m2} (ref >59)

## 2021-07-20 LAB — HIV ANTIBODY (ROUTINE TESTING W REFLEX): HIV Screen 4th Generation wRfx: NONREACTIVE

## 2021-07-20 LAB — APO A1 + B + RATIO
Apolipo. B/A-1 Ratio: 0.9 ratio — ABNORMAL HIGH (ref 0.0–0.7)
Apolipoprotein A-1: 118 mg/dL (ref 101–178)
Apolipoprotein B: 106 mg/dL — ABNORMAL HIGH (ref <90)

## 2021-07-20 LAB — HEPATITIS C ANTIBODY: Hep C Virus Ab: <0.1 s/co ratio (ref 0.0–0.9)

## 2021-07-20 LAB — TSH RFX ON ABNORMAL TO FREE T4: TSH: 0.894 u[IU]/mL (ref 0.450–4.500)

## 2021-07-20 LAB — VITAMIN D 25 HYDROXY (VIT D DEFICIENCY, FRACTURES): Vit D, 25-Hydroxy: 30.9 ng/mL (ref 30.0–100.0)

## 2021-07-20 MED ORDER — VITAMIN D (ERGOCALCIFEROL) 1.25 MG (50000 UNIT) PO CAPS: 50000.0000 [IU] | ORAL_CAPSULE | ORAL | 0 refills | Status: DC

## 2021-07-24 ENCOUNTER — Encounter: Payer: Self-pay | Admitting: Family Medicine

## 2021-07-24 ENCOUNTER — Ambulatory Visit (INDEPENDENT_AMBULATORY_CARE_PROVIDER_SITE_OTHER): Payer: Managed Care, Other (non HMO) | Admitting: Family Medicine

## 2021-07-24 ENCOUNTER — Other Ambulatory Visit: Payer: Self-pay

## 2021-07-24 VITALS — BP 140/90 | HR 75 | Ht 70.0 in | Wt 224.0 lb

## 2021-07-24 DIAGNOSIS — M5441 Lumbago with sciatica, right side: Secondary | ICD-10-CM

## 2021-07-24 DIAGNOSIS — M170 Bilateral primary osteoarthritis of knee: Secondary | ICD-10-CM

## 2021-07-24 DIAGNOSIS — Z9889 Other specified postprocedural states: Secondary | ICD-10-CM | POA: Insufficient documentation

## 2021-07-24 DIAGNOSIS — M5442 Lumbago with sciatica, left side: Secondary | ICD-10-CM

## 2021-07-24 DIAGNOSIS — G8929 Other chronic pain: Secondary | ICD-10-CM | POA: Diagnosis not present

## 2021-07-24 MED ORDER — CYCLOBENZAPRINE HCL 10 MG PO TABS: 10.0000 mg | ORAL_TABLET | Freq: Three times a day (TID) | ORAL | 0 refills | Status: DC | PRN

## 2021-07-24 NOTE — Assessment & Plan Note (Addendum)
Patient with chronic low back pain with bilateral sciatica in the setting of prior lumbar surgery.  Recent x-rays do raise concern for possible hardware issue.  He continues to endorse lumbosacral symptoms with radiation down the lower extremities, progressively worsening over the years, primarily aggravated while in bed.  Given his recent x-ray findings, I have advised further evaluation by spine surgery.  His original surgeon is based out of [place].  We will place a referral for local spine surgeon and coordinate follow-up for patient there.  Over the interim, I have written for cyclobenzaprine 10 mg for patient to dose 3 times a day on an as-needed basis.

## 2021-07-24 NOTE — Progress Notes (Signed)
Primary Care / Sports Medicine Office Visit  Patient Information:  Patient ID: Mark Austin, male DOB: Nov 27, 1980 Age: 40 y.o. MRN: [id number]   Mark Austin is a pleasant 40 y.o. male presenting with the following:  Chief Complaint  Patient presents with   Muscle Pain    X-Rays bilateral knees and lumbar 07/17/21    Patient Active Problem List   Diagnosis Date Noted   Primary osteoarthritis of knees, bilateral 07/24/2021   History of lumbar surgery 07/24/2021   Annual physical exam 07/11/2021   Restless legs 07/11/2021   Bilateral leg pain 07/11/2021   Male erectile disorder 05/29/2021   Obstructive sleep apnea of adult 05/29/2021   PTSD (post-traumatic stress disorder) 05/29/2021   Hypogonadism male 05/29/2021   Chronic low back pain with bilateral sciatica 05/29/2021   Seizure-like activity ([place]) 05/14/2021   Diverticulitis of colon (without mention of hemorrhage)(562.11)    Polyp of sigmoid colon    Sleep apnea 03/07/2021   Decreased hearing of right ear 03/07/2021   Benign hypertension 03/07/2021   Chronic abdominal pain 03/07/2021   Migraine headache 03/07/2021   Myalgia and myositis 03/07/2021   Anxiety state 03/07/2021   TIA (transient ischemic attack) 11/25/2016   Constipation 08/22/2016    Vitals:   07/24/21 1110  BP: 140/90  Pulse: 75  SpO2: 95%   Vitals:   07/24/21 1110  Weight: 224 lb (101.6 kg)  Height: 5\' 10"  (1.778 m)   Body mass index is 32.14 kg/m.  DG Lumbar Spine Complete  Result Date: 07/18/2021 CLINICAL DATA:  Low back pain with tingling in both legs. History of back surgery. EXAM: LUMBAR SPINE - COMPLETE 4+ VIEW COMPARISON:  CT 01/15/2021.  MRI lumbar spine 08/03/2020. FINDINGS: Lumbar spine numbered the lowest segmented appearing lumbar shaped vertebrae on lateral view as L5. L4-L5 spinous process fusion again noted. Loosening of the prosthetic device cannot be excluded. Diffuse mild degenerative change. No acute bony abnormality.  Surgical clips and sutures noted throughout the abdomen and pelvis. Moderate stool volume. IMPRESSION: 1. L4-L5 spinous process fusion again noted. Loosening of the prosthetic device cannot be excluded. 2.  Diffuse mild degenerative change.  No acute bony abnormality. 3.  Moderate stool volume. Electronically Signed   By: Marcello Moores  Register M.D.   On: 07/18/2021 05:41   DG Knee Complete 4 Views Left  Result Date: 07/18/2021 CLINICAL DATA:  History of knee pain. EXAM: LEFT KNEE - COMPLETE 4+ VIEW COMPARISON:  Left knee series report 07/13/2020. FINDINGS: Mild lateral subluxation of the tibia, this may be degenerative. No acute bony or joint abnormality. No evidence of effusion. IMPRESSION: Mild lateral subluxation of the tibia, this may be degenerative. No acute bony or joint abnormality identified. Electronically Signed   By: Marcello Moores  Register M.D.   On: 07/18/2021 05:51   DG Knee Complete 4 Views Right  Result Date: 07/18/2021 CLINICAL DATA:  History of bilateral knee pain. EXAM: RIGHT KNEE - COMPLETE 4+ VIEW COMPARISON:  Prior right knee series report 07/13/2020. FINDINGS: Mild lateral subluxation of the tibia, this may be degenerative. No evidence of fracture or dislocation. No evidence of effusion. IMPRESSION: Mild lateral subluxation of the tibia, this may be degenerative. No evidence of fracture or dislocation. Electronically Signed   By: Marcello Moores  Register M.D.   On: 07/18/2021 05:44     Independent interpretation of notes and tests performed by another provider:   Independent interpretation of bilateral knees dated 07/18/2021 reveals left greater than right medial tibiofemoral narrowing,  subchondral sclerosis, evidence of lateral patellar tilt, no acute osseous processes noted.  Independent interpretation of lumbar spine x-ray dated 07/18/2021 reveals scattered degenerative changes, mild in nature, spinal fusion hardware at L4-L5 with subtle lucency at the superior aspect of the hardware that  could be indicative of loosening.  Procedures performed:   None  Pertinent History, Exam, Impression, and Recommendations:   Primary osteoarthritis of knees, bilateral To chronic conditions with ongoing symptomatology, recent radiographs reveal elements of tricompartmental osteoarthritis with maximal focality to the medial tibiofemoral and patellofemoral articulations.  I did review various treatment strategies and at this stage she is willing to proceed with physical therapy.  I did advise patient that if knee symptoms prevented patient from fully participating and progressing with therapy, he may benefit from corticosteroid and/or viscosupplementation injections.  We will hold from seeking these out at this stage.  He is to return for follow-up in 6 weeks for reevaluation.  If suboptimal progress noted despite adherence to the shared plan, we can revisit injections in addition to alternate oral pharmacotherapy regimen.  Chronic low back pain with bilateral sciatica Patient with chronic low back pain with bilateral sciatica in the setting of prior lumbar surgery.  Recent x-rays do raise concern for possible hardware issue.  He continues to endorse lumbosacral symptoms with radiation down the lower extremities, progressively worsening over the years, primarily aggravated while in bed.  Given his recent x-ray findings, I have advised further evaluation by spine surgery.  His original surgeon is based out of [place].  We will place a referral for local spine surgeon and coordinate follow-up for patient there.  Over the interim, I have written for cyclobenzaprine 10 mg for patient to dose 3 times a day on an as-needed basis.   Orders & Medications Meds ordered this encounter  Medications   cyclobenzaprine (FLEXERIL) 10 MG tablet    Sig: Take 1 tablet (10 mg total) by mouth 3 (three) times daily as needed.    Dispense:  90 tablet    Refill:  0   Orders Placed This Encounter  Procedures    Ambulatory referral to Physical Therapy   Ambulatory referral to Spine Surgery     No follow-ups on file.     [name], MD   Primary Care Sports Medicine [place]

## 2021-07-24 NOTE — Assessment & Plan Note (Signed)
To chronic conditions with ongoing symptomatology, recent radiographs reveal elements of tricompartmental osteoarthritis with maximal focality to the medial tibiofemoral and patellofemoral articulations.  I did review various treatment strategies and at this stage she is willing to proceed with physical therapy.  I did advise patient that if knee symptoms prevented patient from fully participating and progressing with therapy, he may benefit from corticosteroid and/or viscosupplementation injections.  We will hold from seeking these out at this stage.  He is to return for follow-up in 6 weeks for reevaluation.  If suboptimal progress noted despite adherence to the shared plan, we can revisit injections in addition to alternate oral pharmacotherapy regimen.

## 2021-07-26 ENCOUNTER — Ambulatory Visit: Payer: Managed Care, Other (non HMO) | Admitting: Family Medicine

## 2021-07-28 ENCOUNTER — Other Ambulatory Visit: Payer: Self-pay | Admitting: Family Medicine

## 2021-07-28 DIAGNOSIS — I10 Essential (primary) hypertension: Secondary | ICD-10-CM

## 2021-07-28 DIAGNOSIS — K5732 Diverticulitis of large intestine without perforation or abscess without bleeding: Secondary | ICD-10-CM

## 2021-07-28 NOTE — Telephone Encounter (Signed)
Requested Prescriptions  Pending Prescriptions Disp Refills   lisinopril (ZESTRIL) 40 MG tablet [Pharmacy Med Name: LISINOPRIL 40MG  TABLETS] 60 tablet 5    Sig: TAKE 1 TABLET(40 MG) BY MOUTH IN THE MORNING AND AT BEDTIME     Cardiovascular:  ACE Inhibitors Failed - 07/28/2021  3:10 AM      Failed - Last BP in normal range    BP Readings from Last 1 Encounters:  07/24/21 140/90         Passed - Cr in normal range and within 180 days    Creatinine, Ser  Date Value Ref Range Status  07/19/2021 0.93 0.76 - 1.27 mg/dL Final         Passed - K in normal range and within 180 days    Potassium  Date Value Ref Range Status  07/19/2021 4.2 3.5 - 5.2 mmol/L Final         Passed - Patient is not pregnant      Passed - Valid encounter within last 6 months    Recent Outpatient Visits          4 days ago Primary osteoarthritis of knees, bilateral   [facility] [name], MD   2 weeks ago Annual physical exam   [facility] [name], MD   2 months ago Obstructive sleep apnea of adult   [place], Jason J, MD      Future Appointments            In 1 month Zigmund Daniel Earley Abide, MD [facility]            propranolol ER (INDERAL LA) 160 MG SR capsule [Pharmacy Med Name: PROPRANOLOL ER 160MG  CAPSULES] 30 capsule 5    Sig: TAKE 1 CAPSULE(160 MG) BY MOUTH DAILY     Cardiovascular:  Beta Blockers Failed - 07/28/2021  3:10 AM      Failed - Last BP in normal range    BP Readings from Last 1 Encounters:  07/24/21 140/90         Passed - Last Heart Rate in normal range    Pulse Readings from Last 1 Encounters:  07/24/21 75         Passed - Valid encounter within last 6 months    Recent Outpatient Visits          4 days ago Primary osteoarthritis of knees, bilateral   [facility] [name], MD   2 weeks ago Annual physical exam   [facility] [name], MD   2 months ago  Obstructive sleep apnea of adult   [facility] [name], MD      Future Appointments            In 1 month Zigmund Daniel, Earley Abide, MD [facility]            sucralfate (CARAFATE) 1 g tablet [Pharmacy Med Name: SUCRALFATE 1GM TABLETS] 120 tablet 2    Sig: TAKE 1 TABLET(1 GRAM) BY MOUTH FOUR TIMES DAILY AS NEEDED FOR ABDOMINAL [place]     Gastroenterology: Antiacids Passed - 07/28/2021  3:10 AM      Passed - Valid encounter within last 12 months    Recent Outpatient Visits          4 days ago Primary osteoarthritis of knees, bilateral   [facility] [name], MD  2 weeks ago Annual physical exam   [facility] [name], MD   2 months ago Obstructive sleep apnea of adult   [facility] [name], MD      Future Appointments            In 1 month Zigmund Daniel, Earley Abide, MD [facility]            prazosin (MINIPRESS) 1 MG capsule [Pharmacy Med Name: PRAZOSIN 1MG  CAPSULES] 60 capsule 5    Sig: TAKE 1 CAPSULE(1 MG) BY MOUTH TWICE DAILY     Cardiovascular:  Alpha Blockers Failed - 07/28/2021  3:10 AM      Failed - Last BP in normal range    BP Readings from Last 1 Encounters:  07/24/21 140/90         Passed - Valid encounter within last 6 months    Recent Outpatient Visits          4 days ago Primary osteoarthritis of knees, bilateral   [facility] [name], MD   2 weeks ago Annual physical exam   [facility] [name], MD   2 months ago Obstructive sleep apnea of adult   [facility] [name], MD      Future Appointments            In 1 month Zigmund Daniel, Earley Abide, MD [facility], [facility]

## 2021-08-09 ENCOUNTER — Ambulatory Visit: Payer: Managed Care, Other (non HMO) | Attending: Family Medicine

## 2021-08-13 ENCOUNTER — Ambulatory Visit: Payer: Managed Care, Other (non HMO)

## 2021-08-16 ENCOUNTER — Ambulatory Visit
Admission: EM | Admit: 2021-08-16 | Discharge: 2021-08-16 | Disposition: A | Discharge status: Home / Self Care | Payer: Managed Care, Other (non HMO) | Attending: Student | Admitting: Student

## 2021-08-16 ENCOUNTER — Other Ambulatory Visit: Payer: Self-pay

## 2021-08-16 ENCOUNTER — Encounter: Payer: Self-pay | Admitting: Emergency Medicine

## 2021-08-16 DIAGNOSIS — B9689 Other specified bacterial agents as the cause of diseases classified elsewhere: Secondary | ICD-10-CM

## 2021-08-16 DIAGNOSIS — Z112 Encounter for screening for other bacterial diseases: Secondary | ICD-10-CM | POA: Insufficient documentation

## 2021-08-16 DIAGNOSIS — H1089 Other conjunctivitis: Secondary | ICD-10-CM

## 2021-08-16 DIAGNOSIS — I10 Essential (primary) hypertension: Secondary | ICD-10-CM

## 2021-08-16 DIAGNOSIS — J029 Acute pharyngitis, unspecified: Secondary | ICD-10-CM

## 2021-08-16 DIAGNOSIS — H109 Unspecified conjunctivitis: Secondary | ICD-10-CM | POA: Insufficient documentation

## 2021-08-16 LAB — POCT RAPID STREP A (OFFICE): Rapid Strep A Screen: NEGATIVE

## 2021-08-16 MED ORDER — DOXYCYCLINE HYCLATE 100 MG PO CAPS: 100.0000 mg | ORAL_CAPSULE | Freq: Two times a day (BID) | ORAL | 0 refills | Status: AC

## 2021-08-16 MED ORDER — POLYMYXIN B-TRIMETHOPRIM 10000-0.1 UNIT/ML-% OP SOLN: 1.0000 [drp] | OPHTHALMIC | 0 refills | Status: DC

## 2021-08-16 NOTE — ED Triage Notes (Signed)
Pt here with right sided throat pain, right ear pain and bilateral eye pain and redness that is worse on the right side. Vision is blurry in both eyes, but is worse in left.

## 2021-08-16 NOTE — Discharge Instructions (Addendum)
-  Your strep test was negative. We're going to send a culture to grow for bacteria, we'll call if this is positive.  -Doxycycline twice daily for 7 days.  Make sure to wear sunscreen while spending time outside while on this medication as it can increase your chance of sunburn. You can take this medication with food if you have a sensitive stomach. This will cover for MRSA.  -Use the Polytrim drops for your conjunctivitis.  Put 1 drop in the affected eyes every 4 hours while awake for 7 days.   -You can also use warm compresses 1-2 times daily. -If your symptoms get worse instead of better, follow-up immediately with ophthalmologist or other eye doctor.  This includes vision changes, eyelid swelling, pain with movement, worsening of redness despite treatment. -Please check your blood pressure at home or at the pharmacy. If this continues to be >140/90, follow-up with your primary care provider for further blood pressure management/ medication titration. If you develop chest pain, shortness of breath, vision changes, the worst headache of your life- head straight to the ED or call 911.

## 2021-08-16 NOTE — ED Provider Notes (Signed)
[facility]    CSN: [id number] Arrival date & time: 08/16/21  1015      History   Chief Complaint Chief Complaint  Patient presents with   Eye Problem   Sore Throat   Otalgia    HPI Cyan Moultrie is a 41 y.o. male presenting with concern for conjunctivitis. Medical history noncontributory. Possible exposure to MRSA at home- states daughter had some sort of test for her sore throat that was positive for MRSA; I do not have access to these records. Describes throat pain x1 week, R ear pain x1 week without hearing changes, dizziness, tinnitus;  bilateral eye pain with redness R>L. Some blurred vision. Crusting in the morning. Doesn't wear contacts or glasses.  Denies photophobia, foreign body sensation, eye pain, eye pain with movement, injury to eye, double vision.   HPI  Past Medical History:  Diagnosis Date   Back ache    Diverticulitis    GERD (gastroesophageal reflux disease)    History of kidney stones    Hypertension    Inguinal hernia    Seizure ([place])    over 20 yrs ago.  related to medications    Patient Active Problem List   Diagnosis Date Noted   Primary osteoarthritis of knees, bilateral 07/24/2021   History of lumbar surgery 07/24/2021   Annual physical exam 07/11/2021   Restless legs 07/11/2021   Bilateral leg pain 07/11/2021   Male erectile disorder 05/29/2021   Obstructive sleep apnea of adult 05/29/2021   PTSD (post-traumatic stress disorder) 05/29/2021   Hypogonadism male 05/29/2021   Chronic low back pain with bilateral sciatica 05/29/2021   Seizure-like activity ([place]) 05/14/2021   Diverticulitis of colon (without mention of hemorrhage)(562.11)    Polyp of sigmoid colon    Sleep apnea 03/07/2021   Decreased hearing of right ear 03/07/2021   Benign hypertension 03/07/2021   Chronic abdominal pain 03/07/2021   Migraine headache 03/07/2021   Myalgia and myositis 03/07/2021   Anxiety state 03/07/2021   TIA (transient ischemic attack)  11/25/2016   Constipation 08/22/2016    Past Surgical History:  Procedure Laterality Date   back surgeries     x5 lumbar L4L5   BACK SURGERY     BOWEL RESECTION  2016   CHOLECYSTECTOMY  2017   COLON SURGERY     COLONOSCOPY     COLONOSCOPY WITH PROPOFOL N/A 03/19/2021   Procedure: COLONOSCOPY WITH BIOPSY;  Surgeon: Lucilla Lame, MD;  Location: [place];  Service: Endoscopy;  Laterality: N/A;   EXCISION MASS LOWER EXTREMETIES Left 11/30/2019   Procedure: EXCISION MASS LOWER EXTREMETIES, Left Hip;  Surgeon: Jules Husbands, MD;  Location: [facility];  Service: General;  Laterality: Left;   HERNIA REPAIR     INGUINAL HERNIA REPAIR Left    POLYPECTOMY N/A 03/19/2021   Procedure: POLYPECTOMY;  Surgeon: Lucilla Lame, MD;  Location: [place];  Service: Endoscopy;  Laterality: N/A;   SHOULDER SURGERY Right    rotator cuff tear   XI ROBOTIC ASSISTED INGUINAL HERNIA REPAIR WITH MESH Right 10/08/2019   Procedure: XI ROBOTIC ASSISTED INGUINAL HERNIA REPAIR WITH MESH;  Surgeon: Jules Husbands, MD;  Location: [facility];  Service: General;  Laterality: Right;       Home Medications    Prior to Admission medications   Medication Sig Start Date End Date Taking? Authorizing Provider  doxycycline (VIBRAMYCIN) 100 MG capsule Take 1 capsule (100 mg total) by mouth 2 (two) times daily for 7 days.  08/16/21 08/23/21 Yes Hazel Sams, PA-C  trimethoprim-polymyxin b (POLYTRIM) ophthalmic solution Place 1 drop into both eyes every 4 (four) hours. 1 drop into both eyes every 4 hours while awake x7 days. 08/16/21  Yes Hazel Sams, PA-C  anastrozole (ARIMIDEX) 1 MG tablet Take 1 mg by mouth once a week. 05/08/21   [provider]  aspirin 81 MG EC tablet Take 81 mg by mouth daily. 06/12/20   [provider]  carisoprodol (SOMA) 250 MG tablet Take 250 mg by mouth 3 (three) times daily as needed. 06/13/21   [provider]  cyclobenzaprine (FLEXERIL) 10 MG tablet  Take 1 tablet (10 mg total) by mouth 3 (three) times daily as needed. 07/24/21   [name], MD  dicyclomine (BENTYL) 10 MG capsule Take 1 capsule (10 mg total) by mouth 3 (three) times daily as needed for up to 14 days for spasms. or abdominal pain 05/30/21 07/24/21  [name], MD  ezetimibe (ZETIA) 10 MG tablet Take 10 mg by mouth daily. 06/27/21   [provider]  Famotidine (ZANTAC 360 PO) Take 1 tablet by mouth as needed.    [provider]  LATUDA 20 MG TABS tablet Take 20 mg by mouth daily. 07/10/21   [provider]  lisinopril (ZESTRIL) 40 MG tablet TAKE 1 TABLET(40 MG) BY MOUTH IN THE MORNING AND AT BEDTIME 07/28/21   [name], MD  LORazepam (ATIVAN) 1 MG tablet Take 1 mg by mouth 2 (two) times daily. 07/12/21   [provider]  methylphenidate (RITALIN LA) 30 MG 24 hr capsule Take 30 mg by mouth every morning. Patient not taking: Reported on 07/11/2021 01/08/21   [provider]  methylphenidate (RITALIN) 20 MG tablet Take 20 mg by mouth daily. Patient not taking: Reported on 07/11/2021 03/07/21   [provider]  morphine (MSIR) 15 MG tablet Take 15 mg by mouth in the morning and at bedtime. Patient not taking: Reported on 07/11/2021    [provider]  ondansetron (ZOFRAN ODT) 4 MG disintegrating tablet Allow 1-2 tablets to dissolve in your mouth every 8 hours as needed for nausea/vomiting 07/11/21   [name], MD  prazosin (MINIPRESS) 1 MG capsule TAKE 1 CAPSULE(1 MG) BY MOUTH TWICE DAILY 07/28/21   [name], MD  propranolol ER (INDERAL LA) 160 MG SR capsule TAKE 1 CAPSULE(160 MG) BY MOUTH DAILY 07/28/21   [name], MD  senna-docusate (SENOKOT-S) 8.6-50 MG tablet Take 2 tablets by mouth as needed. 05/30/21   [name], MD  spironolactone (ALDACTONE) 25 MG tablet Take 25 mg by mouth 2 (two) times daily. 06/19/21   [provider]  sucralfate (CARAFATE) 1 g  tablet TAKE 1 TABLET(1 GRAM) BY MOUTH FOUR TIMES DAILY AS NEEDED FOR ABDOMINAL DISCOMFORT OR NAUSEA OR VOMITING 07/28/21   [name], MD  tapentadol (NUCYNTA) 100 MG 12 hr tablet Take 100 mg by mouth in the morning and at bedtime.  Patient not taking: Reported on 07/11/2021 07/06/10   [provider]  testosterone cypionate (DEPOTESTOTERONE CYPIONATE) 100 MG/ML injection Inject 200 mg into the muscle every 7 (seven) days. For IM use only  Thursday's    [provider]  varenicline (CHANTIX) 1 MG tablet Take 1 mg by mouth 2 (two) times daily.    [provider]  Vitamin D, Ergocalciferol, (DRISDOL) 1.25 MG (50000 UNIT) CAPS capsule Take 1 capsule (50,000 Units total) by mouth every 7 (  seven) days. Take for 8 total doses(weeks) 07/20/21   [name], MD    Family History Family History  Problem Relation Age of Onset   Healthy Mother    Healthy Father     Social History Social History   Tobacco Use   Smoking status: Every Day    Packs/day: 0.33    Types: Cigarettes   Smokeless tobacco: Former  Scientific laboratory technician Use: Former  Substance Use Topics   Alcohol use: Not Currently   Drug use: Yes    Frequency: 2.0 times per week    Types: Other-see comments    Comment: delta-8-Tetrahydrocannabinol     Allergies   Ketorolac tromethamine, Lidocaine, Tramadol, Atorvastatin, Brassica oleracea, Carisoprodol, Carvedilol, Gabapentin, and Rosuvastatin   Review of Systems Review of Systems  HENT:  Positive for ear pain and sore throat. Negative for trouble swallowing and voice change.   Eyes:  Positive for discharge and redness. Negative for photophobia, pain, itching and visual disturbance.  All other systems reviewed and are negative.   Physical Exam Triage Vital Signs ED Triage Vitals  Enc Vitals Group     BP --      Pulse Rate 08/16/21 1031 87     Resp 08/16/21 1031 18     Temp 08/16/21 1031 97.7 F (36.5 C)     Temp src --      SpO2  08/16/21 1031 96 %     Weight --      Height --      Head Circumference --      Peak Flow --      Pain Score 08/16/21 1045 5     Pain Loc --      Pain Edu? --      Excl. in [place]? --    No data found.  Updated Vital Signs BP (!) 169/89    Pulse 87    Temp 97.7 F (36.5 C)    Resp 18    SpO2 96%   Visual Acuity Right Eye Distance: 20/30 Left Eye Distance: 20/40 Bilateral Distance: 20/30  Right Eye Near:   Left Eye Near:    Bilateral Near:     Physical Exam Vitals reviewed.  Constitutional:      General: He is not in acute distress.    Appearance: Normal appearance. He is not ill-appearing.  HENT:     Head: Normocephalic and atraumatic.     Right Ear: Hearing, tympanic membrane, ear canal and external ear normal. No tenderness. No middle ear effusion. There is no impacted cerumen. No mastoid tenderness. Tympanic membrane is not perforated, erythematous, retracted or bulging.     Left Ear: Hearing, tympanic membrane, ear canal and external ear normal. No tenderness.  No middle ear effusion. There is no impacted cerumen. No mastoid tenderness. Tympanic membrane is not perforated, erythematous, retracted or bulging.     Ears:     Comments: R canal is erythematous without tenderness swelling erythema. Normal TM.    Nose: Nose normal. No congestion.     Mouth/Throat:     Mouth: Mucous membranes are moist.     Pharynx: Uvula midline. Posterior oropharyngeal erythema present. No oropharyngeal exudate.     Tonsils: No tonsillar exudate. 1+ on the right. 1+ on the left.     Comments: Dark erythema posterior pharynx, with minimal tonsillar enlargement. On exam, uvula is midline, she is tolerating her secretions without difficulty, there is no trismus, no drooling, she has normal  phonation  Eyes:     General: Lids are normal. Lids are everted, no foreign bodies appreciated. Vision grossly intact. Gaze aligned appropriately. No visual field deficit.       Right eye: Discharge present. No  foreign body or hordeolum.        Left eye: Discharge present.No foreign body or hordeolum.     Extraocular Movements: Extraocular movements intact.     Right eye: No nystagmus.     Left eye: No nystagmus.     Conjunctiva/sclera:     Right eye: Right conjunctiva is injected. No chemosis, exudate or hemorrhage.    Left eye: Left conjunctiva is injected. No chemosis, exudate or hemorrhage.    Pupils: Pupils are equal, round, and reactive to light. Pupils are equal.     Right eye: No corneal abrasion or fluorescein uptake. Seidel exam negative.     Left eye: No corneal abrasion or fluorescein uptake. Seidel exam negative.    Visual Fields: Right eye visual fields normal and left eye visual fields normal.     Comments: Bilateral conjunctival injection with scant discharge alone lash line. PERRLA. EOMI without pain. No proptosis. No lid changes. Anterior chamber, conjunctivae, sclera all without hemorrhage or visible injury. Visual acuity intact.   Cardiovascular:     Rate and Rhythm: Normal rate and regular rhythm.     Heart sounds: Normal heart sounds.  Pulmonary:     Effort: Pulmonary effort is normal.     Breath sounds: Normal breath sounds and air entry. No decreased breath sounds, wheezing, rhonchi or rales.  Abdominal:     Palpations: Abdomen is soft.     Tenderness: There is no abdominal tenderness. There is no guarding or rebound.  Lymphadenopathy:     Cervical: Cervical adenopathy present.     Right cervical: Superficial cervical adenopathy present.     Left cervical: No superficial cervical adenopathy.  Neurological:     General: No focal deficit present.     Mental Status: He is alert and oriented to person, place, and time.  Psychiatric:        Attention and Perception: Attention and perception normal.        Mood and Affect: Mood and affect normal.        Behavior: Behavior normal.        Thought Content: Thought content normal.        Judgment: Judgment normal.     UC  Treatments / Results  Labs (all labs ordered are listed, but only abnormal results are displayed) Labs Reviewed  CULTURE, GROUP A STREP [facility])  POCT RAPID STREP A (OFFICE)    EKG   Radiology No results found.  Procedures Procedures (including critical care time)  Medications Ordered in UC Medications - No data to display  Initial Impression / Assessment and Plan / UC Course  I have reviewed the triage vital signs and the nursing notes.  Pertinent labs & imaging results that were available during my care of the patient were reviewed by me and considered in my medical decision making (see chart for details).     This patient is a very pleasant 41 y.o. year old male presenting with bacterial conjunctivitis and pharyngitis. Afebrile, nontachy. Patient states daughter was diagnosed with MRSA infection - I suspect she was actually told she is a MRSA carrier, but I am unsure. Rapid strep was negative today, culture sent. Given concern for MRSA exposure, and concurrent pharyngitis, doxycycline PO sent. Also sent Polytrim drops.  He does not wear contacts or glasses. Visual acuity intact.   ED return precautions discussed, f/u with optho if symptoms worsen. Patient verbalizes understanding and agreement.   For hypertension- continue current regimen.  Final Clinical Impressions(s) / UC Diagnoses   Final diagnoses:  Essential hypertension  Bacterial conjunctivitis of both eyes  Screening for streptococcal infection     Discharge Instructions      -Your strep test was negative. We're going to send a culture to grow for bacteria, we'll call if this is positive.  -Doxycycline twice daily for 7 days.  Make sure to wear sunscreen while spending time outside while on this medication as it can increase your chance of sunburn. You can take this medication with food if you have a sensitive stomach. This will cover for MRSA.  -Use the Polytrim drops for your conjunctivitis.  Put 1 drop in  the affected eyes every 4 hours while awake for 7 days.   -You can also use warm compresses 1-2 times daily. -If your symptoms get worse instead of better, follow-up immediately with ophthalmologist or other eye doctor.  This includes vision changes, eyelid swelling, pain with movement, worsening of redness despite treatment. -Please check your blood pressure at home or at the pharmacy. If this continues to be >140/90, follow-up with your primary care provider for further blood pressure management/ medication titration. If you develop chest pain, shortness of breath, vision changes, the worst headache of your life- head straight to the ED or call 911.      ED Prescriptions     Medication Sig Dispense Auth. Provider   doxycycline (VIBRAMYCIN) 100 MG capsule Take 1 capsule (100 mg total) by mouth 2 (two) times daily for 7 days. 14 capsule Hazel Sams, PA-C   trimethoprim-polymyxin b (POLYTRIM) ophthalmic solution Place 1 drop into both eyes every 4 (four) hours. 1 drop into both eyes every 4 hours while awake x7 days. 10 mL Hazel Sams, PA-C      PDMP not reviewed this encounter.   Hazel Sams, PA-C 08/16/21 1117

## 2021-08-19 LAB — CULTURE, GROUP A STREP (THRC)

## 2021-08-31 ENCOUNTER — Encounter: Payer: Self-pay | Admitting: Family Medicine

## 2021-08-31 NOTE — Telephone Encounter (Signed)
Please advise if applicable.  Patient following with Dr. Cari Caraway (Neurosurgery).

## 2021-09-03 ENCOUNTER — Other Ambulatory Visit: Payer: Self-pay

## 2021-09-03 ENCOUNTER — Emergency Department: Payer: Managed Care, Other (non HMO)

## 2021-09-03 ENCOUNTER — Emergency Department
Admission: EM | Admit: 2021-09-03 | Discharge: 2021-09-03 | Disposition: A | Discharge status: Home / Self Care | Payer: Managed Care, Other (non HMO) | Attending: Emergency Medicine | Admitting: Emergency Medicine

## 2021-09-03 DIAGNOSIS — R209 Unspecified disturbances of skin sensation: Secondary | ICD-10-CM | POA: Diagnosis not present

## 2021-09-03 DIAGNOSIS — M5442 Lumbago with sciatica, left side: Secondary | ICD-10-CM | POA: Diagnosis present

## 2021-09-03 DIAGNOSIS — M5441 Lumbago with sciatica, right side: Secondary | ICD-10-CM | POA: Diagnosis not present

## 2021-09-03 DIAGNOSIS — R32 Unspecified urinary incontinence: Secondary | ICD-10-CM

## 2021-09-03 DIAGNOSIS — G8929 Other chronic pain: Secondary | ICD-10-CM | POA: Diagnosis not present

## 2021-09-03 LAB — CBC WITH DIFFERENTIAL/PLATELET
Abs Immature Granulocytes: 0.03 10*3/uL (ref 0.00–0.07)
Basophils Absolute: 0 10*3/uL (ref 0.0–0.1)
Basophils Relative: 1 %
Eosinophils Absolute: 0.1 10*3/uL (ref 0.0–0.5)
Eosinophils Relative: 2 %
HCT: 51.3 % (ref 39.0–52.0)
Hemoglobin: 17.2 g/dL — ABNORMAL HIGH (ref 13.0–17.0)
Immature Granulocytes: 0 %
Lymphocytes Relative: 31 %
Lymphs Abs: 2.3 10*3/uL (ref 0.7–4.0)
MCH: 30.2 pg (ref 26.0–34.0)
MCHC: 33.5 g/dL (ref 30.0–36.0)
MCV: 90.2 fL (ref 80.0–100.0)
Monocytes Absolute: 0.6 10*3/uL (ref 0.1–1.0)
Monocytes Relative: 7 %
Neutro Abs: 4.3 10*3/uL (ref 1.7–7.7)
Neutrophils Relative %: 59 %
Platelets: 209 10*3/uL (ref 150–400)
RBC: 5.69 MIL/uL (ref 4.22–5.81)
RDW: 11.9 % (ref 11.5–15.5)
WBC: 7.4 10*3/uL (ref 4.0–10.5)
nRBC: 0 % (ref 0.0–0.2)

## 2021-09-03 LAB — URINALYSIS, COMPLETE (UACMP) WITH MICROSCOPIC
Bacteria, UA: NONE SEEN
Bilirubin Urine: NEGATIVE
Glucose, UA: NEGATIVE mg/dL
Hgb urine dipstick: NEGATIVE
Ketones, ur: NEGATIVE mg/dL
Leukocytes,Ua: NEGATIVE
Nitrite: NEGATIVE
Protein, ur: NEGATIVE mg/dL
Specific Gravity, Urine: 1.02 (ref 1.005–1.030)
Squamous Epithelial / HPF: NONE SEEN (ref 0–5)
pH: 5.5 (ref 5.0–8.0)

## 2021-09-03 LAB — BASIC METABOLIC PANEL
Anion gap: 6 (ref 5–15)
BUN: 12 mg/dL (ref 6–20)
CO2: 29 mmol/L (ref 22–32)
Calcium: 9.5 mg/dL (ref 8.9–10.3)
Chloride: 102 mmol/L (ref 98–111)
Creatinine, Ser: 1.01 mg/dL (ref 0.61–1.24)
GFR, Estimated: >60 mL/min (ref >60)
Glucose, Bld: 89 mg/dL (ref 70–99)
Potassium: 4.4 mmol/L (ref 3.5–5.1)
Sodium: 137 mmol/L (ref 135–145)

## 2021-09-03 IMAGING — MR MR LUMBAR SPINE W/O CM
5 series · 31 of 48 positions shown · non-contrast
Comparison: Lumbar spine x-rays dated [DATE]. MRI lumbar
spine dated [DATE].

CLINICAL DATA: Acute low back pain with bilateral leg numbness,
difficulty walking, and urinary incontinence.

EXAM:
MRI LUMBAR SPINE WITHOUT CONTRAST
TECHNIQUE: Multiplanar, multisequence MR imaging of the lumbar spine was
performed. No intravenous contrast was administered.

[Series 5: T2 · sagittal · 4.0mm · 0.88mm/px · 7 of 17 slices shown (1 of 2)]
[im 1/17]
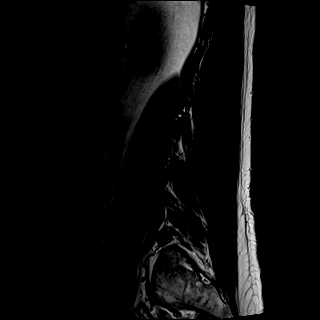
[im 3/17]
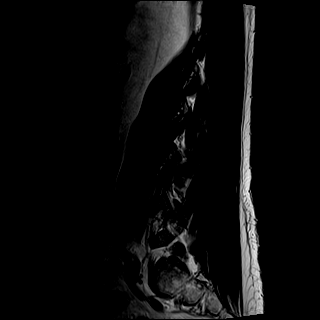
[im 6/17]
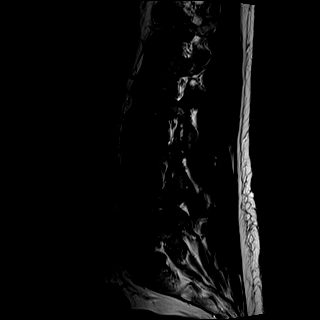
[im 9/17]
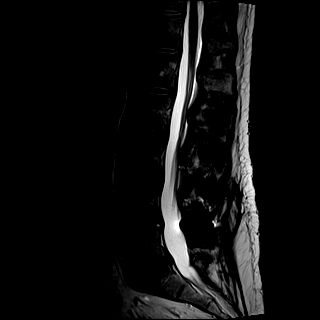
[im 11/17]
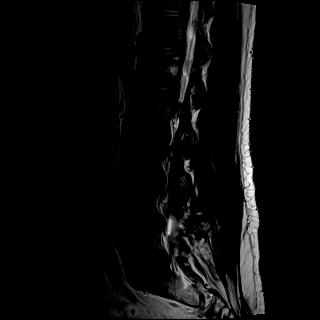
[im 14/17]
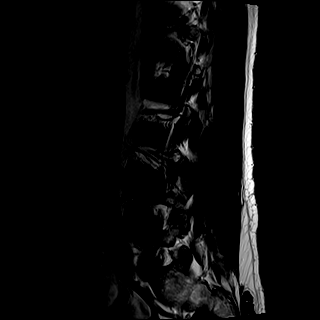
[im 17/17]
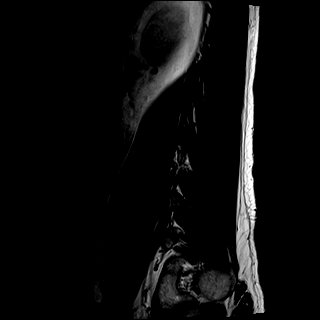

[Series 6: T1 · sagittal · 4.0mm · 0.88mm/px · 7 of 17 slices shown (1 of 2)]
[im 1/17]
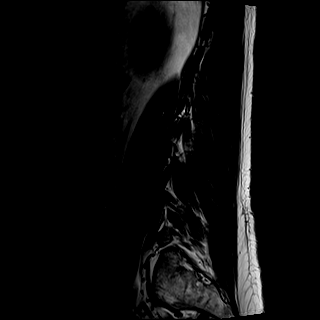
[im 3/17]
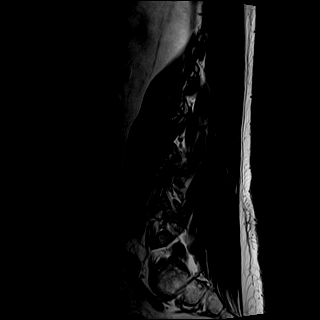
[im 6/17]
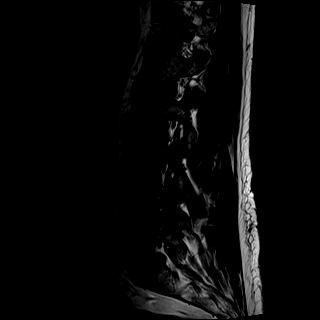
[im 9/17]
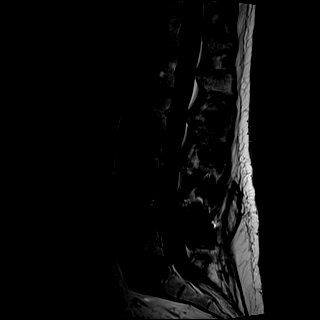
[im 11/17]
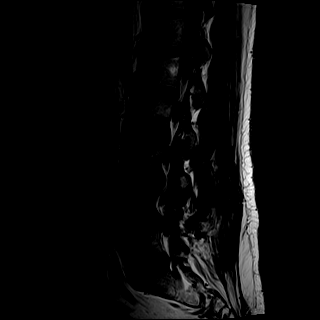
[im 14/17]
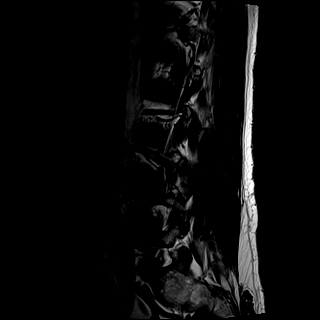
[im 17/17]
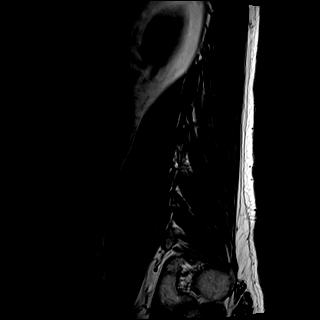

[Series 7: STIR · sagittal · 4.0mm · 0.44mm/px · 1 of 17 slices shown]
[im 1/17]
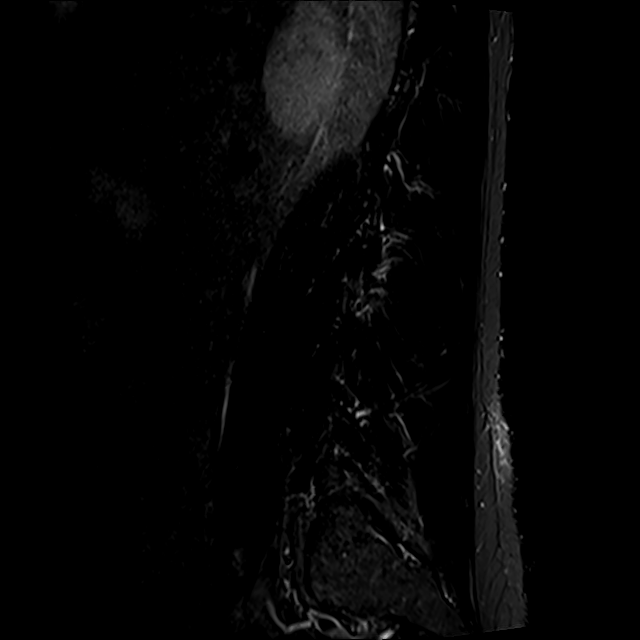

[Series 8: T2 · axial · 4.0mm · 0.78mm/px · z∈[-136,+90]mm · 8 of 38 slices shown (2 of 2)]
[im 1/38]
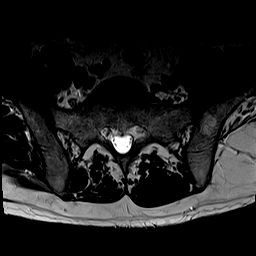
[im 6/38]
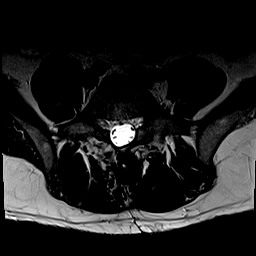
[im 12/38]
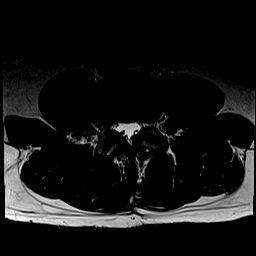
[im 18/38]
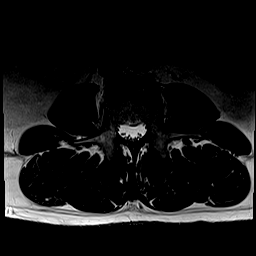
[im 20/38]
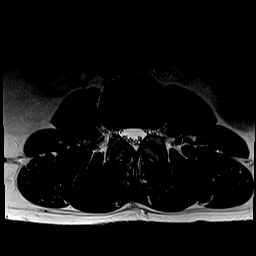
[im 26/38]
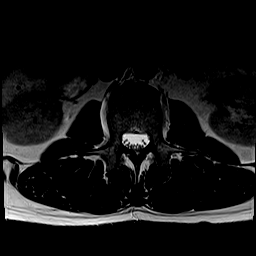
[im 32/38]
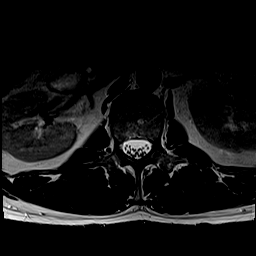
[im 38/38]
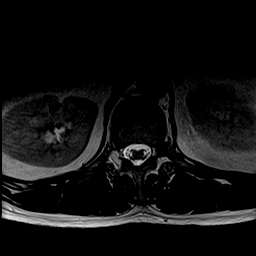

[Series 9: T1 · axial · 4.0mm · 0.39mm/px · z∈[-136,+90]mm · 8 of 38 slices shown (2 of 2)]
[im 1/38]
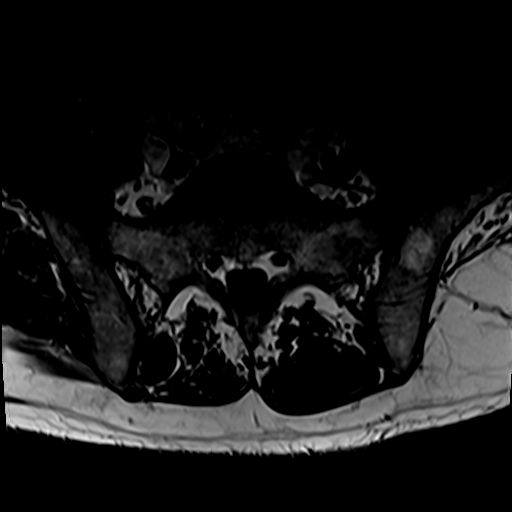
[im 6/38]
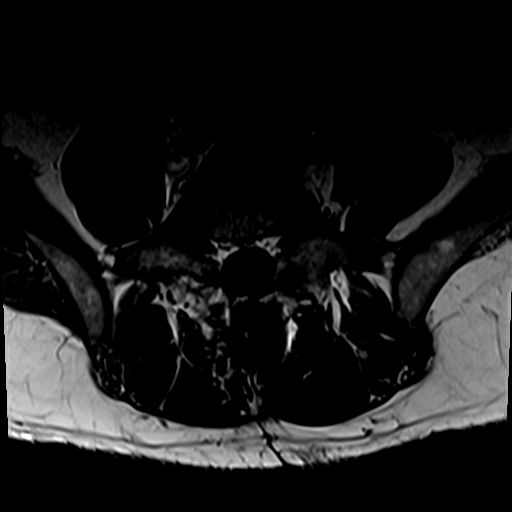
[im 12/38]
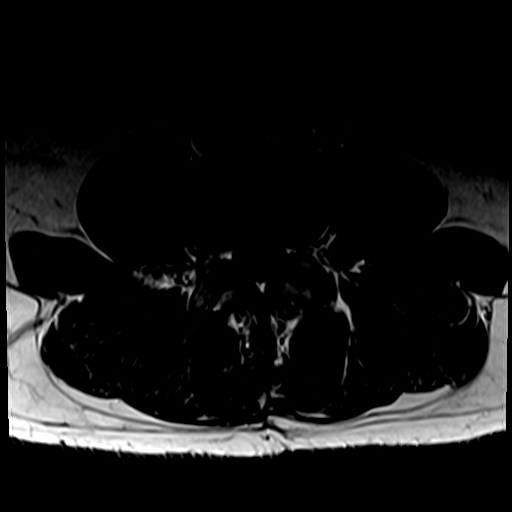
[im 18/38]
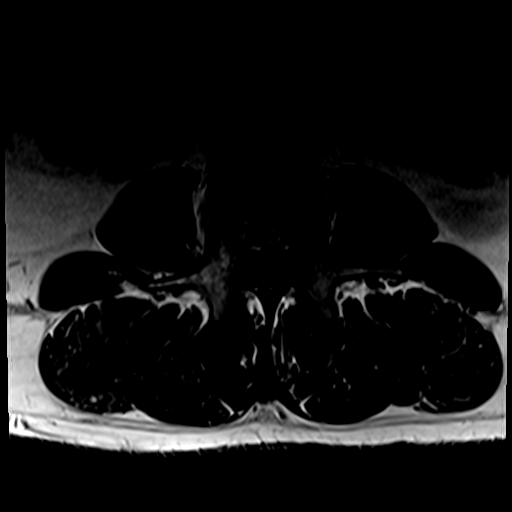
[im 20/38]
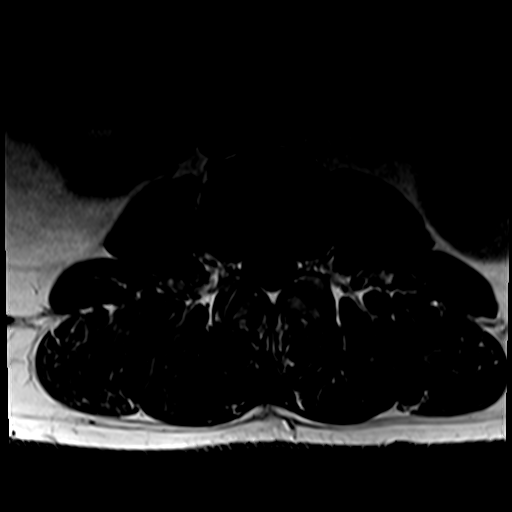
[im 26/38]
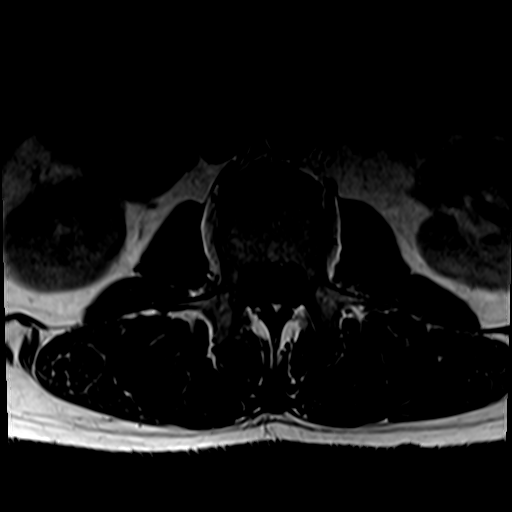
[im 32/38]
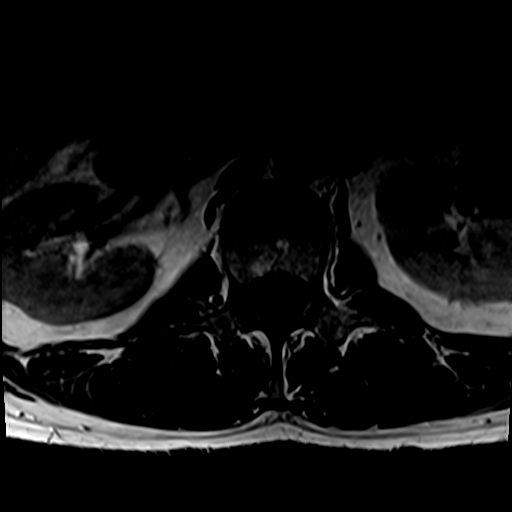
[im 38/38]
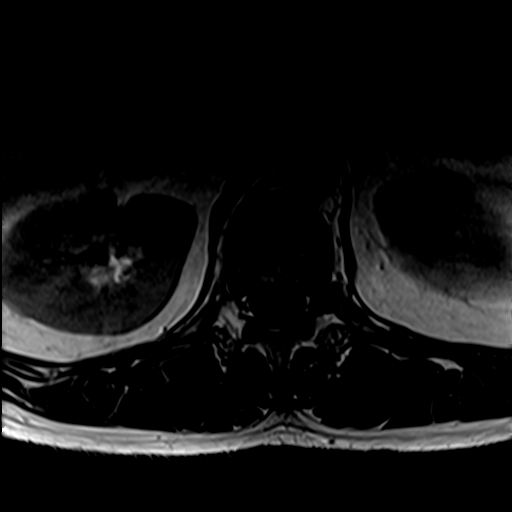

[31 of 48 positions shown; findings below may reference images not displayed]

FINDINGS: Segmentation:  Standard.

Alignment:  Physiologic.

Vertebrae: No fracture, evidence of discitis, or bone lesion.
Unchanged interspinous spacer at L4-L5.

Conus medullaris and cauda equina: Conus extends to the L1 level.
Conus and cauda equina appear normal.

Paraspinal and other soft tissues: Negative.

Disc levels:

T12-L1 to L3-L4:  Negative.

L4-L5: Prior posterior decompression and interspinous spacer
placement. Unchanged small shallow central disc protrusion with
annular fissure. Unchanged mild bilateral facet arthropathy.
Unchanged moderate left and mild right neuroforaminal stenosis. No
spinal canal stenosis.

L5-S1: Prior right hemilaminectomy. Unchanged mild disc bulging with
superimposed small right paracentral disc protrusion and annular
fissure encroaching on the descending right S1 nerve root. Unchanged
mild right lateral recess stenosis. No spinal canal or
neuroforaminal stenosis.
IMPRESSION: 1. Similar postsurgical and degenerative changes at L4-L5 and L5-S1.
Unchanged moderate left neuroforaminal stenosis at L4-L5.
2. Unchanged small right paracentral disc protrusion and annular
fissure at L5-S1 encroaching on the descending right S1 nerve root.

## 2021-09-03 MED ORDER — PREDNISONE 10 MG (21) PO TBPK: ORAL_TABLET | ORAL | 0 refills | Status: DC

## 2021-09-03 MED ORDER — IBUPROFEN 600 MG PO TABS
600.0000 mg | ORAL_TABLET | Freq: Once | ORAL | Status: AC
  Administered 2021-09-03: 600 mg via ORAL
  Filled 2021-09-03: qty 1

## 2021-09-03 MED ORDER — OXYCODONE-ACETAMINOPHEN 5-325 MG PO TABS
1.0000 | ORAL_TABLET | Freq: Once | ORAL | Status: AC
  Administered 2021-09-03: 1 via ORAL
  Filled 2021-09-03: qty 1

## 2021-09-03 MED ORDER — HYDROMORPHONE HCL 1 MG/ML IJ SOLN
1.0000 mg | Freq: Once | INTRAMUSCULAR | Status: AC
Start: 2021-09-03 — End: 2021-09-03
  Administered 2021-09-03: 1 mg via INTRAVENOUS
  Filled 2021-09-03: qty 1

## 2021-09-03 MED ORDER — OXYCODONE HCL 5 MG PO TABS: 5.0000 mg | ORAL_TABLET | Freq: Four times a day (QID) | ORAL | 0 refills | Status: AC | PRN

## 2021-09-03 NOTE — ED Triage Notes (Signed)
Pt comes with c/o back pain that started two days ago. Pt states numbness in legs and now trouble walking. Pt has also lost bladder control.  Pt has hx of coflex placed.

## 2021-09-03 NOTE — ED Notes (Signed)
Pt teaching provided on medications that may cause drowsiness. Pt instructed not to drive or operate heavy machinery while taking the prescribed medication. Pt verbalized understanding.  ? ?Pt provided discharge instructions and prescription information. Pt was given the opportunity to ask questions and questions were answered. Discharge signature not obtained in the setting of the COVID-19 pandemic in order to reduce high touch surfaces.  ? ?

## 2021-09-03 NOTE — ED Provider Notes (Signed)
[facility] Provider Note    Event Date/Time   First MD Initiated Contact with Patient 09/03/21 1103     (approximate)   History   Back Pain   HPI  Mark Austin is a 41 y.o. male with a past medical history of OSA, RLS, arthritis, chronic bilateral low back pain thought related to some sciatica, migraine headaches, TIA and history of coflex stabilization device placed in his lumbar spine in [place] over a decade ago presents for assessment of some acute on chronic low back pain.  Patient states that this occurred on 2/4 while he was having intercourse.  He immediately felt a stabbing pain.  His back and had complete flaccidity of his penis.  Since then he has had urinary and fecal incontinence.  He also feels that he has not been able to walk properly as he feels that his left leg will give out from underneath him.  He endorses tingling in his legs but denies any true numbness.  Denies any other recent injuries or falls.  Denies any fevers, abdominal pain, chest pain, cough, headache, earache, sore throat or any other clear associated sick symptoms.  Denies IV drug use recent steroids or history of malignancy.      Physical Exam  Triage Vital Signs: ED Triage Vitals [09/03/21 1059]  Enc Vitals Group     BP      Pulse      Resp      Temp      Temp src      SpO2      Weight      Height      Head Circumference      Peak Flow      Pain Score 7     Pain Loc      Pain Edu?      Excl. in [place]?     Most recent vital signs: Vitals:   09/03/21 1425 09/03/21 1600  BP: (!) 145/89 (!) 156/89  Pulse: 86 89  Resp: 20 18  Temp:    SpO2: 97% 96%    General: Awake, appears uncomfortable. CV:  Good peripheral perfusion.  Plus DP pulses Resp:  Normal effort.  Abd:  No distention.  Other:  Some tenderness over the L-spine and bilateral paralumbar muscles.  Patient seems slightly weaker on left hip flexion extension otherwise seems to have symmetric strength in  lower extremities.  2+ patellar reflexes   ED Results / Procedures / Treatments  Labs (all labs ordered are listed, but only abnormal results are displayed) Labs Reviewed  URINALYSIS, COMPLETE (UACMP) WITH MICROSCOPIC - Abnormal; Notable for the following components:      Result Value   APPearance CLEAR (*)    All other components within normal limits  CBC WITH DIFFERENTIAL/PLATELET - Abnormal; Notable for the following components:   Hemoglobin 17.2 (*)    All other components within normal limits  BASIC METABOLIC PANEL     EKG    RADIOLOGY   PROCEDURES:  Critical Care performed: No  Procedures    MEDICATIONS ORDERED IN ED: Medications  oxyCODONE-acetaminophen (PERCOCET/ROXICET) 5-325 MG per tablet 1 tablet (1 tablet Oral Given 09/03/21 1214)  HYDROmorphone (DILAUDID) injection 1 mg (1 mg Intravenous Given 09/03/21 1425)  ibuprofen (ADVIL) tablet 600 mg (600 mg Oral Given 09/03/21 1602)     IMPRESSION / MDM / ASSESSMENT AND PLAN / ED COURSE  I reviewed the triage vital signs and the nursing notes.  Differential diagnosis includes, but is not limited to pathologic fracture, herniated disc, cauda equina, conus medullaris versus other MSK versus cystitis.  CBC without leukocytosis or acute anemia.  BMP shows no significant electrolyte or metabolic derangements.  UA shows no evidence of infection or blood.  Care patient signed over to assuming father at approximately 4 PM.  Plan is to follow-up MRI and reassess.      FINAL CLINICAL IMPRESSION(S) / ED DIAGNOSES   Final diagnoses:  Acute bilateral low back pain with bilateral sciatica  Urinary incontinence, unspecified type     Rx / DC Orders   ED Discharge Orders     None        Note:  This document was prepared using Dragon voice recognition software and may include unintentional dictation errors.   Lucrezia Starch, MD 09/03/21 [id number]

## 2021-09-03 NOTE — ED Provider Notes (Addendum)
4:37 PM Assumed care for off going team.   Blood pressure (!) 156/89, pulse 89, temperature 98.9 F (37.2 C), resp. rate 18, SpO2 96 %.  See their HPI for full report but in brief pending MRI/bladder scan.    IMPRESSION: 1. 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 2. 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    Post void bladder scan was 0  On further explanation does not sound like he is consistently having urinary and rectal incontinence.  She is a little dribbling and his stool is normal just states that sometimes he has to wipe a little extra because a little bit extra comes out after he is gone.  I did a rectal exam and he is got good tone with good grip and normal sensation.  He is feels my sensation in his bilateral legs and is able to lift up both legs.  He has no significant C or T-spine tenderness.  I wonder if some of his symptoms could just be from the pain versus issues with his pelvic floor.  Patient already has follow-up with neurosurgery next month.  I stated that they should call him to try to get a earlier follow-up.  We will try a steroid taper and give a few oxycodone to help in the meantime.  He understands not to drive or work on these  Patient requesting 1 more dose of pain meds prior to leaving.  Will give 1 more dose and watch patient and if he is able to ambulate patient will be discharged home  I discussed the provisional nature of ED diagnosis, the treatment so far, the ongoing plan of care, follow up appointments and return precautions with the patient and any family or support people present. They expressed understanding and agreed with the plan, discharged home.         Vanessa Honcut, MD 09/03/21 1753    Vanessa Mira Monte, MD 09/03/21 [phone]

## 2021-09-03 NOTE — Discharge Instructions (Addendum)
Call the neurosurgeon to get closer follow-up and return to the ER if develop worsening symptoms or any other concern   IMPRESSION: 1. 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 2. 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  Take oxycodone as prescribed. Do not drink alcohol, drive or participate in any other potentially dangerous activities while taking this medication as it may make you sleepy. Do not take this medication with any other sedating medications, either prescription or over-the-counter. If you were prescribed Percocet or Vicodin, do not take these with acetaminophen (Tylenol) as it is already contained within these medications.  This medication is an opiate (or narcotic) pain medication and can be habit forming. Use it as little as possible to achieve adequate pain control. Do not use or use it with extreme caution if you have a history of opiate abuse or dependence. If you are on a pain contract with your primary care doctor or a pain specialist, be sure to let them know you were prescribed this medication today from the Emergency Department. This medication is intended for your use only - do not give any to anyone else and keep it in a secure place where nobody else, especially children, have access to it.

## 2021-09-11 NOTE — Progress Notes (Signed)
Patient: Delores Thelen  Service Category: E/M  Provider: Gaspar Cola, MD  DOB: 1980-10-17  DOS: 09/12/2021  Referring Provider: [name], MD  MRN: [id number]  Setting: Ambulatory outpatient  PCP: [name], MD  Type: New Patient  Specialty: Interventional Pain Management    Location: Office  Delivery: Face-to-face     Primary Reason(s) for Visit: Encounter for initial evaluation of one or more chronic problems (new to examiner) potentially causing chronic pain, and posing a threat to normal musculoskeletal function. (Level of risk: High) CC: Back Pain (low)  HPI  Mr. Devita is a 41 y.o. year old, male patient, who comes for the first time to our practice referred by Zigmund Daniel Earley Abide, MD for our initial evaluation of his chronic pain. He has Sleep apnea; Constipation; Decreased hearing of right ear; Benign hypertension; Chronic abdominal pain; Migraine headache; Myalgia and myositis; Anxiety state; TIA (transient ischemic attack); Diverticulitis of colon (without mention of hemorrhage)(562.11); Polyp of sigmoid colon; Male erectile disorder; Obstructive sleep apnea of adult; Seizure-like activity ([place]); PTSD (post-traumatic stress disorder); Hypogonadism male; Chronic low back pain (1ry area of Pain) (Midline) w/ sciatica (Bilateral); Annual physical exam; Restless legs; Chronic lower extremity pain (2ry area of Pain) (Bilateral) (L>R); Primary osteoarthritis of knees, bilateral; History of lumbar surgery; Chronic pain syndrome; Pharmacologic therapy; Disorder of skeletal system; Problems influencing health status; Abnormal MRI, lumbar spine (09/03/2021); 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 Today he comes in for evaluation of his Back Pain (low)  Pain Assessment: Location: Lower Back Radiating: radiates down both legs left  goes down the side to ankle , right goes down the back and to calf.  LEft leg is worse Onset: More than a month ago Duration: Chronic pain Quality: Sharp (electricity, static feeling, like leg is asleep) Severity: 5 /10 (subjective, self-reported pain score)  Effect on ADL: Limits activities Timing: Constant Modifying factors: lying on back with something under legs BP: (!) 161/107   HR: (!) 105  Onset and Duration: Present longer than 3 months Cause of pain:  miltary Severity: Getting worse, NAS-11 at its worse: 10/10, NAS-11 at its best: 5/10, NAS-11 now: 7/10, and NAS-11 on the average: 7/10 Timing: Morning, Afternoon, During activity or exercise, and After activity or exercise Aggravating Factors: Bending, Climbing, Intercourse (sex), Kneeling, Lifiting, Motion, Prolonged sitting, Prolonged standing, Squatting, Stooping , Twisting, Walking, Walking downhill, and Working Alleviating Factors: Lying down, Medications, and Resting Associated Problems: Inability to concentrate, Pain that wakes patient up, and Pain that does not allow patient to sleep Quality of Pain: Disabling Previous Examinations or Tests: CT scan, Endoscopy, MRI scan, X-rays, Nerve conduction test, Neurosurgical evaluation, Orthopedic evaluation, and Psychiatric evaluation Previous Treatments: Chiropractic manipulations, Epidural steroid injections, Facet blocks, Narcotic medications, Physical Therapy, Pool exercises, Radiofrequency, Relaxation therapy, Spinal cord stimulator, Steroid  treatments by mouth, Strengthening exercises, Stretching exercises, TENS, Traction, and Trigger point injections  According to the patient his primary area of pain is that of the lower back (axial) (Midline) (Bilateral) (L>R).  The patient  has a history of multiple back surgeries (x5) with the first 1 having been around 2011.  The patient indicates having had physical therapy (which worsens his pain), having had an MRI approximately 2 days ago, and having had multiple lumbar interventions including a [employer] spinal cord stimulator implant which later had to be removed due to the fact that he used to be a chronic smoker and every time that he coughed it would "zapped him".  The patient refers recently having been seen at the ED secondary to a flareup of the low back pain and leg pain accompanied by urinary and fecal incontinence.  He was referred to Dr. Cari Caraway (neurosurgeon) and he is scheduled to see him on March 1.  Review of the lumbar MRI revealed: (09/03/2021) LUMBAR MRI FINDINGS: Vertebrae: Unchanged interspinous spacer at L4-L5.   DISC LEVELS: L4-5: Prior posterior decompression and interspinous spacer placement. Unchanged small shallow central disc protrusion with annular fissure. Unchanged mild bilateral facet arthropathy. Unchanged moderate left and mild right neuroforaminal stenosis. No spinal canal stenosis. L5-S1: Prior right hemilaminectomy. Unchanged mild disc bulging with superimposed small right paracentral disc protrusion and annular fissure encroaching on the descending right S1 nerve root. Unchanged mild right lateral recess stenosis. No spinal canal or neuroforaminal stenosis.  IMPRESSION: 1. 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 2. 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  The patient's secondary area  pain is that of the lower extremities (Bilateral) (L>R).  The patient describes that in the case of the left lower extremity the pain goes down through the back of the leg all the way down to the ankle and he also will occasionally experience "pins-and-needles" in his left foot.  He refers that these pins-and-needles usually happen around the perimetry of the foot.  In the case of the right lower extremity the pain goes down through the lateral aspect of the leg again into the area of the ankle but it does not get into the foot.  He denies any type of pins, needles, or numbness of his right foot.  The patient refers having had a nerve conduction test at [facility], which was not available at the time of this evaluation.  The patient's third area pain is that of his knees (Bilateral).  He describes this pain to be intermittent.  He denies any prior surgeries, x-rays, physical therapy, joint injections or nerve blocks.  Physical exam today shows a healthy 41 year old male who is able to toe walk and heel walk without any problems.  Straight leg raise was negative bilaterally.  Lumbar flexion did trigger pain to the lower back on the flexion portion of the maneuver.  During the extension portion of the maneuver he demonstrated a double stage recovery.  Hyperextension of the lumbar spine did trigger pain in the midline of his lower back.  Hyperextension and rotation and Kemp maneuver was positive bilaterally for what appeared to be bilateral lumbar facet arthralgia.  However 1 thing that I did not notice was that the pain seemed to be contralateral to the side that he was turning into.  Provocative Patrick maneuver was positive on the left side for acute left hip arthralgia and it also trigger bilateral groin pain with hip range of motion.  Today I took the time to provide the patient with information regarding my pain practice. The patient was informed that my  practice is divided into two sections: an  interventional pain management section, as well as a completely separate and distinct medication management section. I explained that I have procedure days for my interventional therapies, and evaluation days for follow-ups and medication management. Because of the amount of documentation required during both, they are kept separated. This means that there is the possibility that he may be scheduled for a procedure on one day, and medication management the next. I have also informed him that because of staffing and facility limitations, I no longer take patients for medication management only. To illustrate the reasons for this, I gave the patient the example of surgeons, and how inappropriate it would be to refer a patient to his/her care, just to write for the post-surgical antibiotics on a surgery done by a different surgeon.   Because interventional pain management is my board-certified specialty, the patient was informed that joining my practice means that they are open to any and all interventional therapies. I made it clear that this does not mean that they will be forced to have any procedures done. What this means is that I believe interventional therapies to be essential part of the diagnosis and proper management of chronic pain conditions. Therefore, patients not interested in these interventional alternatives will be better served under the care of a different practitioner.  The patient was also made aware of my Comprehensive Pain Management Safety Guidelines where by joining my practice, they limit all of their nerve blocks and joint injections to those done by our practice, for as long as we are retained to manage their care.   Historic Controlled Substance Pharmacotherapy Review  PMP and historical list of controlled substances: Oxycodone IR 5 mg tablet, 1 tab p.o. every 6 hours (last filled 09/03/2021); testosterone; lorazepam 1 mg, 1 tab p.o. twice daily; Khary suprastomal 250 mg tablet, 1  tab p.o. 3 times daily; modafinil 200 mg 1 daily; methylphenidate LA 20 mg capsule 1 daily; morphine sulfate 15 mg tablet 1 twice daily; Nucynta ER 100 mg tablet, 1 tab p.o. twice daily; Concerta ER 18 mg tablet Current opioid analgesics: Oxycodone IR 5 mg tablet, 1 tab p.o. every 6 hours (last filled 09/03/2021) MME/day: 30 mg/day  Historical Monitoring: The patient  reports current drug use. Frequency: 2.00 times per week. Drug: Other-see comments. List of all UDS Test(s): No results found for: MDMA, COCAINSCRNUR, [place], [place], CANNABQUANT, THCU, [place] List of other Serum/Urine Drug Screening Test(s):  No results found for: AMPHSCRSER, BARBSCRSER, BENZOSCRSER, COCAINSCRSER, COCAINSCRNUR, PCPSCRSER, PCPQUANT, THCSCRSER, THCU, CANNABQUANT, OPIATESCRSER, OXYSCRSER, PROPOXSCRSER, ETH Historical Background Evaluation: [place] PMP: PDMP reviewed during this encounter. Online review of the past 32-monthperiod conducted.             PMP NARX Score Report:  Narcotic: 400 Sedative: 430 Stimulant: 180 [place] Department of public safety, offender search: (Editor, commissioningInformation) Non-contributory Risk Assessment Profile: Aberrant behavior: None observed or detected today Risk factors for fatal opioid overdose: None identified today PMP NARX Overdose Risk Score: 510 Fatal overdose hazard ratio (HR): Calculation deferred Non-fatal overdose hazard ratio (HR): Calculation deferred Risk of opioid abuse or dependence: 0.7-3.0% with doses ? 36 MME/day and 6.1-26% with doses ? 120 MME/day. Substance use disorder (SUD) risk level: See below Personal History of Substance Abuse (SUD-Substance use disorder):  Alcohol: Negative  Illegal Drugs: Negative  Rx Drugs: Negative  ORT Risk Level calculation: Low Risk  Opioid Risk Tool - 09/12/21 0940       Family History  of Substance Abuse   Alcohol Negative    Illegal Drugs Negative    Rx Drugs Negative      Personal History of Substance Abuse   Alcohol Negative     Illegal Drugs Negative    Rx Drugs Negative      Age   Age between 50-45 years  No      History of Preadolescent Sexual Abuse   History of Preadolescent Sexual Abuse Negative or Male      Psychological Disease   Psychological Disease Positive      Total Score   Opioid Risk Tool Scoring 2    Opioid Risk Interpretation Low Risk            ORT Scoring interpretation table:  Score <3 = Low Risk for SUD  Score between 4-7 = Moderate Risk for SUD  Score >8 = High Risk for Opioid Abuse   PHQ-2 Depression Scale:  Total score: 0  PHQ-2 Scoring interpretation table: (Score and probability of major depressive disorder)  Score 0 = No depression  Score 1 = 15.4% Probability  Score 2 = 21.1% Probability  Score 3 = 38.4% Probability  Score 4 = 45.5% Probability  Score 5 = 56.4% Probability  Score 6 = 78.6% Probability   PHQ-9 Depression Scale:  Total score: 0  PHQ-9 Scoring interpretation table:  Score 0-4 = No depression  Score 5-9 = Mild depression  Score 10-14 = Moderate depression  Score 15-19 = Moderately severe depression  Score 20-27 = Severe depression (2.4 times higher risk of SUD and 2.89 times higher risk of overuse)   Pharmacologic Plan: As per protocol, I have not taken over any controlled substance management, pending the results of ordered tests and/or consults.            Initial impression: Pending review of available data and ordered tests.  Meds   Current Outpatient Medications:    aspirin 81 MG EC tablet, Take 81 mg by mouth daily., Disp: , Rfl:    cyclobenzaprine (FLEXERIL) 10 MG tablet, Take 1 tablet (10 mg total) by mouth 3 (three) times daily as needed., Disp: 90 tablet, Rfl: 0   ezetimibe (ZETIA) 10 MG tablet, Take 10 mg by mouth daily., Disp: , Rfl:    Famotidine (ZANTAC 360 PO), Take 1 tablet by mouth as needed., Disp: , Rfl:    LATUDA 20 MG TABS tablet, Take 20 mg by mouth daily., Disp: , Rfl:    lisinopril (ZESTRIL) 40 MG tablet, TAKE 1 TABLET(40  MG) BY MOUTH IN THE MORNING AND AT BEDTIME, Disp: 60 tablet, Rfl: 5   ondansetron (ZOFRAN ODT) 4 MG disintegrating tablet, Allow 1-2 tablets to dissolve in your mouth every 8 hours as needed for nausea/vomiting, Disp: 30 tablet, Rfl: 0   prazosin (MINIPRESS) 1 MG capsule, TAKE 1 CAPSULE(1 MG) BY MOUTH TWICE DAILY, Disp: 60 capsule, Rfl: 5   propranolol ER (INDERAL LA) 160 MG SR capsule, TAKE 1 CAPSULE(160 MG) BY MOUTH DAILY, Disp: 30 capsule, Rfl: 5   senna-docusate (SENOKOT-S) 8.6-50 MG tablet, Take 2 tablets by mouth as needed., Disp: 60 tablet, Rfl: 0   tadalafil (CIALIS) 20 MG tablet, Take 20 mg by mouth daily as needed for erectile dysfunction., Disp: , Rfl:    testosterone cypionate (DEPOTESTOTERONE CYPIONATE) 100 MG/ML injection, Inject 200 mg into the muscle every 7 (seven) days. For IM use only  Thursday's, Disp: , Rfl:    Vitamin D, Ergocalciferol, (DRISDOL) 1.25 MG (50000 UNIT) CAPS  capsule, Take 1 capsule (50,000 Units total) by mouth every 7 (seven) days. Take for 8 total doses(weeks), Disp: 8 capsule, Rfl: 0   anastrozole (ARIMIDEX) 1 MG tablet, Take 1 mg by mouth once a week., Disp: , Rfl:    carisoprodol (SOMA) 250 MG tablet, Take 250 mg by mouth 3 (three) times daily as needed., Disp: , Rfl:    dicyclomine (BENTYL) 10 MG capsule, Take 1 capsule (10 mg total) by mouth 3 (three) times daily as needed for up to 14 days for spasms. or abdominal pain, Disp: 30 capsule, Rfl: 0   LORazepam (ATIVAN) 1 MG tablet, Take 1 mg by mouth 2 (two) times daily., Disp: , Rfl:    methylphenidate (RITALIN LA) 30 MG 24 hr capsule, Take 30 mg by mouth every morning. (Patient not taking: Reported on 07/11/2021), Disp: , Rfl:    methylphenidate (RITALIN) 20 MG tablet, Take 20 mg by mouth daily. (Patient not taking: Reported on 07/11/2021), Disp: , Rfl:    morphine (MSIR) 15 MG tablet, Take 15 mg by mouth in the morning and at bedtime. (Patient not taking: Reported on 07/11/2021), Disp: , Rfl:     spironolactone (ALDACTONE) 25 MG tablet, Take 25 mg by mouth 2 (two) times daily., Disp: , Rfl:    tapentadol (NUCYNTA) 100 MG 12 hr tablet, Take 100 mg by mouth in the morning and at bedtime.  (Patient not taking: Reported on 07/11/2021), Disp: , Rfl:    trimethoprim-polymyxin b (POLYTRIM) ophthalmic solution, Place 1 drop into both eyes every 4 (four) hours. 1 drop into both eyes every 4 hours while awake x7 days., Disp: 10 mL, Rfl: 0   varenicline (CHANTIX) 1 MG tablet, Take 1 mg by mouth 2 (two) times daily., Disp: , Rfl:   Imaging Review  Lumbosacral Imaging: Lumbar MR wo contrast: Results for orders placed during the hospital encounter of 09/03/21 MR LUMBAR SPINE WO CONTRAST  Narrative CLINICAL DATA:  Acute low back pain with bilateral leg numbness, difficulty walking, and urinary incontinence.  EXAM: MRI LUMBAR SPINE WITHOUT CONTRAST  TECHNIQUE: Multiplanar, multisequence MR imaging of the lumbar spine was performed. No intravenous contrast was administered.  COMPARISON:  Lumbar spine x-rays dated July 17, 2021. MRI lumbar spine dated August 03, 2020.  FINDINGS: Segmentation:  Standard.  Alignment:  Physiologic.  Vertebrae: No fracture, evidence of discitis, or bone lesion. Unchanged interspinous spacer at L4-L5.  Conus medullaris and cauda equina: Conus extends to the L1 level. Conus and cauda equina appear normal.  Paraspinal and other soft tissues: Negative.  Disc levels:  T12-L1 to L3-L4:  Negative.  L4-L5: Prior posterior decompression and interspinous spacer placement. Unchanged small shallow central disc protrusion with annular fissure. Unchanged mild bilateral facet arthropathy. Unchanged moderate left and mild right neuroforaminal stenosis. No spinal canal stenosis.  L5-S1: Prior right hemilaminectomy. Unchanged mild disc bulging with superimposed small right paracentral disc protrusion and annular fissure encroaching on the descending right S1  nerve root. Unchanged mild right lateral recess stenosis. No spinal canal or neuroforaminal stenosis.  IMPRESSION: 1. 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 2. 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   Electronically Signed By: Titus Dubin M.D. On: 09/03/2021 13:48  Lumbar DG (Complete) 4+V: Results for orders placed during the hospital encounter of 07/12/21 DG Lumbar Spine Complete  Narrative CLINICAL DATA:  Low back pain with  tingling in both legs. History of back surgery.  EXAM: LUMBAR SPINE - COMPLETE 4+ VIEW  COMPARISON:  CT 01/15/2021.  MRI lumbar spine 08/03/2020.  FINDINGS: Lumbar spine numbered the lowest segmented appearing lumbar shaped vertebrae on lateral view as L5. L4-L5 spinous process fusion again noted. Loosening of the prosthetic device cannot be excluded. Diffuse mild degenerative change. No acute bony abnormality. Surgical clips and sutures noted throughout the abdomen and pelvis. Moderate stool volume.  IMPRESSION: 1. L4-L5 spinous process fusion again noted. Loosening of the prosthetic device cannot be excluded.  2.  Diffuse mild degenerative change.  No acute bony abnormality.  3.  Moderate stool volume.   Electronically Signed By: Marcello Moores  Register M.D. On: 07/18/2021 05:41  Knee Imaging: Knee-R DG 4 views: Results for orders placed during the hospital encounter of 07/17/21 DG Knee Complete 4 Views Right  Narrative CLINICAL DATA:  History of bilateral knee pain.  EXAM: RIGHT KNEE - COMPLETE 4+ VIEW  COMPARISON:  Prior right knee series report 07/13/2020.  FINDINGS: Mild lateral subluxation of the tibia, this may be degenerative. No evidence of fracture or dislocation. No evidence of effusion.  IMPRESSION: Mild lateral subluxation of the tibia, this may  be degenerative. No evidence of fracture or dislocation.   Electronically Signed By: Marcello Moores  Register M.D. On: 07/18/2021 05:44  Knee-L DG 4 views: Results for orders placed during the hospital encounter of 07/17/21 DG Knee Complete 4 Views Left  Narrative CLINICAL DATA:  History of knee pain.  EXAM: LEFT KNEE - COMPLETE 4+ VIEW  COMPARISON:  Left knee series report 07/13/2020.  FINDINGS: Mild lateral subluxation of the tibia, this may be degenerative. No acute bony or joint abnormality. No evidence of effusion.  IMPRESSION: Mild lateral subluxation of the tibia, this may be degenerative. No acute bony or joint abnormality identified.   Electronically Signed By: Marcello Moores  Register M.D. On: 07/18/2021 05:51  Complexity Note: Imaging results reviewed. Results shared with Mr. Jalomo, using Layman's terms.                        ROS  Cardiovascular: High blood pressure Pulmonary or Respiratory: No reported pulmonary signs or symptoms such as wheezing and difficulty taking a deep full breath (Asthma), difficulty blowing air out (Emphysema), coughing up mucus (Bronchitis), persistent dry cough, or temporary stoppage of breathing during sleep Neurological: No reported neurological signs or symptoms such as seizures, abnormal skin sensations, urinary and/or fecal incontinence, being born with an abnormal open spine and/or a tethered spinal cord Psychological-Psychiatric: No reported psychological or psychiatric signs or symptoms such as difficulty sleeping, anxiety, depression, delusions or hallucinations (schizophrenial), mood swings (bipolar disorders) or suicidal ideations or attempts Gastrointestinal: No reported gastrointestinal signs or symptoms such as vomiting or evacuating blood, reflux, heartburn, alternating episodes of diarrhea and constipation, inflamed or scarred liver, or pancreas or irrregular and/or infrequent bowel movements Genitourinary: No reported renal or  genitourinary signs or symptoms such as difficulty voiding or producing urine, peeing blood, non-functioning kidney, kidney stones, difficulty emptying the bladder, difficulty controlling the flow of urine, or chronic kidney disease Hematological: No reported hematological signs or symptoms such as prolonged bleeding, low or poor functioning platelets, bruising or bleeding easily, hereditary bleeding problems, low energy levels due to low hemoglobin or being anemic Endocrine: No reported endocrine signs or symptoms such as high or low blood sugar, rapid heart rate due to high thyroid levels, obesity or weight gain due to slow thyroid or  thyroid disease Rheumatologic: No reported rheumatological signs and symptoms such as fatigue, joint pain, tenderness, swelling, redness, heat, stiffness, decreased range of motion, with or without associated rash Musculoskeletal: Negative for myasthenia gravis, muscular dystrophy, multiple sclerosis or malignant hyperthermia Work History: Working full time  Allergies  Mr. Mcclinton is allergic to ketorolac tromethamine, lidocaine, tramadol, atorvastatin, brassica oleracea, carisoprodol, carvedilol, gabapentin, and rosuvastatin.  Laboratory Chemistry Profile   Renal Lab Results  Component Value Date   BUN 12 09/03/2021   CREATININE 1.01 09/03/2021   BCR 11 07/19/2021   GFRAA >60 02/18/2020   GFRNONAA >60 09/03/2021   PROTEINUR NEGATIVE 09/03/2021     Electrolytes Lab Results  Component Value Date   NA 137 09/03/2021   K 4.4 09/03/2021   CL 102 09/03/2021   CALCIUM 9.5 09/03/2021   MG 2.0 10/22/2020     Hepatic Lab Results  Component Value Date   AST 18 07/19/2021   ALT 13 07/19/2021   ALBUMIN 4.6 07/19/2021   ALKPHOS 59 07/19/2021   LIPASE 63 (H) 04/06/2021     ID Lab Results  Component Value Date   HIV Non Reactive 07/19/2021   SARSCOV2NAA NEGATIVE 11/26/2019     Bone Lab Results  Component Value Date   VD25OH 30.9 07/19/2021      Endocrine Lab Results  Component Value Date   GLUCOSE 89 09/03/2021   GLUCOSEU NEGATIVE 09/03/2021   TSH 0.894 07/19/2021     Neuropathy Lab Results  Component Value Date   HIV Non Reactive 07/19/2021     CNS No results found for: COLORCSF, APPEARCSF, RBCCOUNTCSF, WBCCSF, POLYSCSF, LYMPHSCSF, EOSCSF, PROTEINCSF, GLUCCSF, JCVIRUS, CSFOLI, IGGCSF, LABACHR, ACETBL, LABACHR, ACETBL   Inflammation (CRP: Acute   ESR: Chronic) No results found for: CRP, ESRSEDRATE, LATICACIDVEN   Rheumatology No results found for: RF, ANA, LABURIC, URICUR, LYMEIGGIGMAB, LYMEABIGMQN, HLAB27   Coagulation Lab Results  Component Value Date   PLT 209 09/03/2021     Cardiovascular Lab Results  Component Value Date   CKTOTAL 643 (H) 10/22/2020   HGB 17.2 (H) 09/03/2021   HCT 51.3 09/03/2021     Screening Lab Results  Component Value Date   SARSCOV2NAA NEGATIVE 11/26/2019   HIV Non Reactive 07/19/2021     Cancer No results found for: CEA, CA125, LABCA2   Allergens No results found for: ALMOND, APPLE, ASPARAGUS, AVOCADO, BANANA, BARLEY, BASIL, BAYLEAF, GREENBEAN, LIMABEAN, WHITEBEAN, BEEFIGE, REDBEET, BLUEBERRY, BROCCOLI, CABBAGE, MELON, CARROT, CASEIN, CASHEWNUT, CAULIFLOWER, CELERY     Note: Lab results reviewed.  PFSH  Drug: Mr. Nickolson  reports current drug use. Frequency: 2.00 times per week. Drug: Other-see comments. Alcohol:  reports that he does not currently use alcohol. Tobacco:  reports that he has quit smoking. He has quit using smokeless tobacco. Medical:  has a past medical history of Anxiety, Back ache, Depression, Diverticulitis, GERD (gastroesophageal reflux disease), History of kidney stones, Hypertension, Inguinal hernia, PTSD (post-traumatic stress disorder), and Seizure ([place]). Family: family history includes Healthy in his father and mother.  Past Surgical History:  Procedure Laterality Date   back surgeries     x5 lumbar L4L5   BACK SURGERY     BOWEL RESECTION  2016    CHOLECYSTECTOMY  2017   COLON SURGERY     COLONOSCOPY     COLONOSCOPY WITH PROPOFOL N/A 03/19/2021   Procedure: COLONOSCOPY WITH BIOPSY;  Surgeon: Lucilla Lame, MD;  Location: [facility];  Service: Endoscopy;  Laterality: N/A;   EXCISION MASS LOWER EXTREMETIES Left 11/30/2019  Procedure: EXCISION MASS LOWER EXTREMETIES, Left Hip;  Surgeon: Jules Husbands, MD;  Location: [facility];  Service: General;  Laterality: Left;   HERNIA REPAIR     INGUINAL HERNIA REPAIR Left    POLYPECTOMY N/A 03/19/2021   Procedure: POLYPECTOMY;  Surgeon: Lucilla Lame, MD;  Location: [facility];  Service: Endoscopy;  Laterality: N/A;   SHOULDER SURGERY Right    rotator cuff tear   XI ROBOTIC ASSISTED INGUINAL HERNIA REPAIR WITH MESH Right 10/08/2019   Procedure: XI ROBOTIC ASSISTED INGUINAL HERNIA REPAIR WITH MESH;  Surgeon: Jules Husbands, MD;  Location: [facility];  Service: General;  Laterality: Right;   Active Ambulatory Problems    Diagnosis Date Noted   Sleep apnea 03/07/2021   Constipation 08/22/2016   Decreased hearing of right ear 03/07/2021   Benign hypertension 03/07/2021   Chronic abdominal pain 03/07/2021   Migraine headache 03/07/2021   Myalgia and myositis 03/07/2021   Anxiety state 03/07/2021   TIA (transient ischemic attack) 11/25/2016   Diverticulitis of colon (without mention of hemorrhage)(562.11)    Polyp of sigmoid colon    Male erectile disorder 05/29/2021   Obstructive sleep apnea of adult 05/29/2021   Seizure-like activity ([place]) 05/14/2021   PTSD (post-traumatic stress disorder) 05/29/2021   Hypogonadism male 05/29/2021   Chronic low back pain (1ry area of Pain) (Midline) w/ sciatica (Bilateral) 05/29/2021   Annual physical exam 07/11/2021   Restless legs 07/11/2021   Chronic lower extremity pain (2ry area of Pain) (Bilateral) (L>R) 07/11/2021   Primary osteoarthritis of knees, bilateral 07/24/2021   History of lumbar surgery 07/24/2021   Chronic pain syndrome  09/12/2021   Pharmacologic therapy 09/12/2021   Disorder of skeletal system 09/12/2021   Problems influencing health status 09/12/2021   Abnormal MRI, lumbar spine (09/03/2021) 09/12/2021   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, L5-S1) 09/12/2021   Lumbar facet arthropathy (L4-5) (Bilateral) 09/12/2021   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) 09/12/2021   Lumbosacral lateral recess stenosis (Right: L5-S1) 09/12/2021   Lumbar intervertebral disc protrusion (L4-5, L5-S1) 09/12/2021   Urinary and fecal incontinence (09/03/2021) 09/12/2021   Failed spinal cord stimulator, 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   Chronic groin pain (Left) 09/12/2021   Chronic hip pain (3ry area of Pain) (Bilateral) (L>R) 09/12/2021   Chronic knee pain (4th area of Pain) (intermittent) (Bilateral) 09/12/2021   Discogenic low back pain (L4-5, L5-S1) 09/12/2021   Lumbar facet joint syndrome 09/12/2021   Resolved Ambulatory Problems    Diagnosis Date Noted   No Resolved Ambulatory Problems   Past Medical History:  Diagnosis Date   Anxiety    Back ache    Depression    Diverticulitis    GERD (gastroesophageal reflux disease)    History of kidney stones    Hypertension    Inguinal hernia    Seizure ([place])    Constitutional Exam  General appearance: Well nourished, well developed, and well hydrated. In no apparent acute distress Vitals:   09/12/21 0926 09/12/21 0929  BP: (!) 174/120 (!) 161/107  Pulse: (!) 105   Resp: 18   Temp: (!) 97.1 F (36.2 C)   SpO2: 99%   Weight: 210 lb (95.3 kg)   Height: _0  (1.803 m)    BMI Assessment: Estimated body mass index is 29.29 kg/m as calculated from the following:   Height as of this encounter: _1  (1.803 m).   Weight as of this encounter:  210 lb (95.3 kg).  BMI interpretation table: BMI level Category Range association with higher incidence of chronic pain  <18 kg/m2 Underweight    18.5-24.9 kg/m2 Ideal body weight   25-29.9 kg/m2 Overweight Increased incidence by 20%  30-34.9 kg/m2 Obese (Class I) Increased incidence by 68%  35-39.9 kg/m2 Severe obesity (Class II) Increased incidence by 136%  >40 kg/m2 Extreme obesity (Class III) Increased incidence by 254%   Patient's current BMI Ideal Body weight  Body mass index is 29.29 kg/m. Ideal body weight: 75.3 kg (166 lb 0.1 oz) Adjusted ideal body weight: 83.3 kg (183 lb 9.7 oz)   BMI Readings from Last 4 Encounters:  09/12/21 29.29 kg/m  07/24/21 32.14 kg/m  07/11/21 32.28 kg/m  05/29/21 31.14 kg/m   Wt Readings from Last 4 Encounters:  09/12/21 210 lb (95.3 kg)  07/24/21 224 lb (101.6 kg)  07/11/21 225 lb (102.1 kg)  05/29/21 217 lb (98.4 kg)    Psych/Mental status: Alert, oriented x 3 (person, place, & time)       Eyes: PERLA Respiratory: No evidence of acute respiratory distress  Assessment  Primary Diagnosis & Pertinent Problem List: The primary encounter diagnosis was Chronic pain syndrome. 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), Lumbosacral lateral recess stenosis (Right: L5-S1), Lumbar intervertebral disc protrusion (L4-5, L5-S1), Abnormal MRI, lumbar spine (09/03/2021), Pharmacologic therapy, Chronic use of opiate for therapeutic purpose, Disorder of skeletal system, Problems influencing health status, Urinary and fecal incontinence (09/03/2021), Failed spinal cord stimulator, sequela, Chronic low back pain (1ry area of Pain) (Midline) w/ sciatica (Bilateral), Chronic lower extremity pain (2ry area of Pain) (Bilateral) (L>R), Chronic groin pain (Left), Chronic hip pain (3ry area of Pain) (Bilateral) (L>R), Chronic knee pain (4th area of Pain) (intermittent) (Bilateral), Discogenic low back pain (L4-5, L5-S1), and Lumbar facet  joint syndrome were also pertinent to this visit.  Visit Diagnosis (New problems to examiner): 1. Chronic pain syndrome   2. 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, L5-S1)   4. Lumbar facet arthropathy (L4-5) (Bilateral)   5. 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 Lumbosacral lateral recess stenosis (Right: L5-S1)   7. Lumbar intervertebral disc protrusion (L4-5, L5-S1)   8. Abnormal MRI, lumbar spine (09/03/2021)   9. Pharmacologic therapy   10. Chronic use of opiate for therapeutic purpose   11. Disorder of skeletal system   12. Problems influencing health status   13. Urinary and fecal incontinence (09/03/2021)   14. Failed spinal cord stimulator, sequela   15. Chronic low back pain (1ry area of Pain) (Midline) w/ sciatica (Bilateral)   16. Chronic lower extremity pain (2ry area of Pain) (Bilateral) (L>R)   17. Chronic groin pain (Left)   18. Chronic hip pain (3ry area of Pain) (Bilateral) (L>R)   19. Chronic knee pain (4th area of Pain) (intermittent) (Bilateral)   20. Discogenic low back pain (L4-5, L5-S1)   21. Lumbar facet joint syndrome    Plan of Care (Initial workup plan)  Note: Mr. Cowdrey was reminded that as per protocol, today's visit has been an evaluation only. We have not taken over the patient's controlled substance management.  Problem-specific plan: No problem-specific Assessment & Plan notes found for this encounter.  Lab Orders         Compliance Drug Analysis, Ur  Magnesium         Vitamin B12         Sedimentation rate         C-reactive protein         Testosterone, Free, Total, SHBG     Imaging Orders         DG HIP UNILAT W OR W/O PELVIS 2-3 VIEWS RIGHT         DG HIP UNILAT W OR W/O PELVIS 2-3 VIEWS LEFT         DG Lumbar Spine Complete W/Bend     Referral Orders  No referral(s) requested today   Procedure Orders    No procedure(s) ordered today    Pharmacotherapy (current): Medications ordered:  No orders of the defined types were placed in this encounter.  Medications administered during this visit: Anvith Mauriello had no medications administered during this visit.   Pharmacological management options:  Opioid Analgesics: The patient was informed that there is no guarantee that he would be a candidate for opioid analgesics. The decision will be made following CDC guidelines. This decision will be based on the results of diagnostic studies, as well as Mr. Mcneeley's risk profile.   Membrane stabilizer: To be determined at a later time  Muscle relaxant: To be determined at a later time  NSAID: To be determined at a later time  Other analgesic(s): To be determined at a later time   Interventional management options: Mr. Decandia was informed that there is no guarantee that he would be a candidate for interventional therapies. The decision will be based on the results of diagnostic studies, as well as Mr. Kautzman's risk profile.  Procedure(s) under consideration:  Pending results of ordered studies      Interventional Therapies  Risk   Complexity Considerations:   Estimated body mass index is 29.29 kg/m as calculated from the following:   Height as of this encounter: _0  (1.803 m).   Weight as of this encounter: 210 lb (95.3 kg). WNL   Planned   Pending:   Pending further evaluation   Under consideration:   Since the patient experienced increase in the pain with flexion and extension movements of the lumbar spine, we will go ahead and order an x-ray of the lumbar spine with flexion-extension views to determine if there is any type of instability. Diagnostic bilateral L4 TFESI #1  Diagnostic right L5 TFESI #1  Diagnostic midline caudal ESI + diagnostic epidurogram #1  Diagnostic x-rays of the hip  Diagnostic bilateral IA hip joint injection #1  Diagnostic bilateral lumbar facet block #1    Completed:   None at this time    Therapeutic   Palliative (PRN) options:   None established    Provider-requested follow-up: Return for (43mn), Eval-day (M,W), (F2F), 2nd Visit, for review of ordered tests.  Future Appointments  Date Time Provider [facility] [employer] 09/24/2021  8:20 AM [facility] Culver [facility] 09/26/2021 10:00 AM NMilinda Pointer MD [facility] None    Note by: FGaspar Cola MD Date: 09/12/2021; Time: 12:58 PM

## 2021-09-12 ENCOUNTER — Ambulatory Visit
Admission: RE | Admit: 2021-09-12 | Discharge: 2021-09-12 | Disposition: A | Discharge status: Home / Self Care | Payer: Managed Care, Other (non HMO) | Source: Ambulatory Visit | Attending: Pain Medicine | Admitting: Pain Medicine

## 2021-09-12 ENCOUNTER — Ambulatory Visit
Admission: RE | Admit: 2021-09-12 | Discharge: 2021-09-12 | Disposition: A | Discharge status: Home / Self Care | Payer: Managed Care, Other (non HMO) | Attending: Pain Medicine | Admitting: Pain Medicine

## 2021-09-12 ENCOUNTER — Encounter: Payer: Self-pay | Admitting: Pain Medicine

## 2021-09-12 ENCOUNTER — Other Ambulatory Visit: Payer: Self-pay

## 2021-09-12 ENCOUNTER — Ambulatory Visit (HOSPITAL_BASED_OUTPATIENT_CLINIC_OR_DEPARTMENT_OTHER): Payer: Managed Care, Other (non HMO) | Admitting: Pain Medicine

## 2021-09-12 VITALS — BP 161/107 | HR 105 | Temp 97.1°F | Resp 18 | Ht 71.0 in | Wt 210.0 lb

## 2021-09-12 DIAGNOSIS — Z79891 Long term (current) use of opiate analgesic: Secondary | ICD-10-CM

## 2021-09-12 DIAGNOSIS — M5441 Lumbago with sciatica, right side: Secondary | ICD-10-CM

## 2021-09-12 DIAGNOSIS — M961 Postlaminectomy syndrome, not elsewhere classified: Secondary | ICD-10-CM | POA: Insufficient documentation

## 2021-09-12 DIAGNOSIS — M5126 Other intervertebral disc displacement, lumbar region: Secondary | ICD-10-CM

## 2021-09-12 DIAGNOSIS — M4807 Spinal stenosis, lumbosacral region: Secondary | ICD-10-CM | POA: Insufficient documentation

## 2021-09-12 DIAGNOSIS — M47816 Spondylosis without myelopathy or radiculopathy, lumbar region: Secondary | ICD-10-CM

## 2021-09-12 DIAGNOSIS — M5442 Lumbago with sciatica, left side: Secondary | ICD-10-CM | POA: Insufficient documentation

## 2021-09-12 DIAGNOSIS — Z79899 Other long term (current) drug therapy: Secondary | ICD-10-CM | POA: Insufficient documentation

## 2021-09-12 DIAGNOSIS — R32 Unspecified urinary incontinence: Secondary | ICD-10-CM | POA: Insufficient documentation

## 2021-09-12 DIAGNOSIS — M79604 Pain in right leg: Secondary | ICD-10-CM | POA: Insufficient documentation

## 2021-09-12 DIAGNOSIS — M25562 Pain in left knee: Secondary | ICD-10-CM

## 2021-09-12 DIAGNOSIS — M899 Disorder of bone, unspecified: Secondary | ICD-10-CM | POA: Insufficient documentation

## 2021-09-12 DIAGNOSIS — M25552 Pain in left hip: Secondary | ICD-10-CM

## 2021-09-12 DIAGNOSIS — M5136 Other intervertebral disc degeneration, lumbar region with discogenic back pain only: Secondary | ICD-10-CM

## 2021-09-12 DIAGNOSIS — R937 Abnormal findings on diagnostic imaging of other parts of musculoskeletal system: Secondary | ICD-10-CM | POA: Insufficient documentation

## 2021-09-12 DIAGNOSIS — R159 Full incontinence of feces: Secondary | ICD-10-CM

## 2021-09-12 DIAGNOSIS — G8929 Other chronic pain: Secondary | ICD-10-CM | POA: Insufficient documentation

## 2021-09-12 DIAGNOSIS — G894 Chronic pain syndrome: Secondary | ICD-10-CM

## 2021-09-12 DIAGNOSIS — T85192S Other mechanical complication of implanted electronic neurostimulator (electrode) of spinal cord, sequela: Secondary | ICD-10-CM

## 2021-09-12 DIAGNOSIS — Z789 Other specified health status: Secondary | ICD-10-CM

## 2021-09-12 DIAGNOSIS — M51369 Other intervertebral disc degeneration, lumbar region without mention of lumbar back pain or lower extremity pain: Secondary | ICD-10-CM

## 2021-09-12 DIAGNOSIS — M79605 Pain in left leg: Secondary | ICD-10-CM

## 2021-09-12 DIAGNOSIS — R1032 Left lower quadrant pain: Secondary | ICD-10-CM

## 2021-09-12 DIAGNOSIS — M48061 Spinal stenosis, lumbar region without neurogenic claudication: Secondary | ICD-10-CM

## 2021-09-12 DIAGNOSIS — M25561 Pain in right knee: Secondary | ICD-10-CM | POA: Insufficient documentation

## 2021-09-12 DIAGNOSIS — M25551 Pain in right hip: Secondary | ICD-10-CM | POA: Insufficient documentation

## 2021-09-12 IMAGING — CR DG HIP (WITH OR WITHOUT PELVIS) 2-3V*L*
1 series · 3 of 3 positions shown · non-contrast
Comparison: None.

CLINICAL DATA: Hip pain

EXAM:
DG HIP (WITH OR WITHOUT PELVIS) 2-3V LEFT

[Series 1: dg hip unilat w or w/o pelvis 2-3 views  · non-contrast · 0.14mm/px · 3 of 3 slices shown]
[im 1/3]
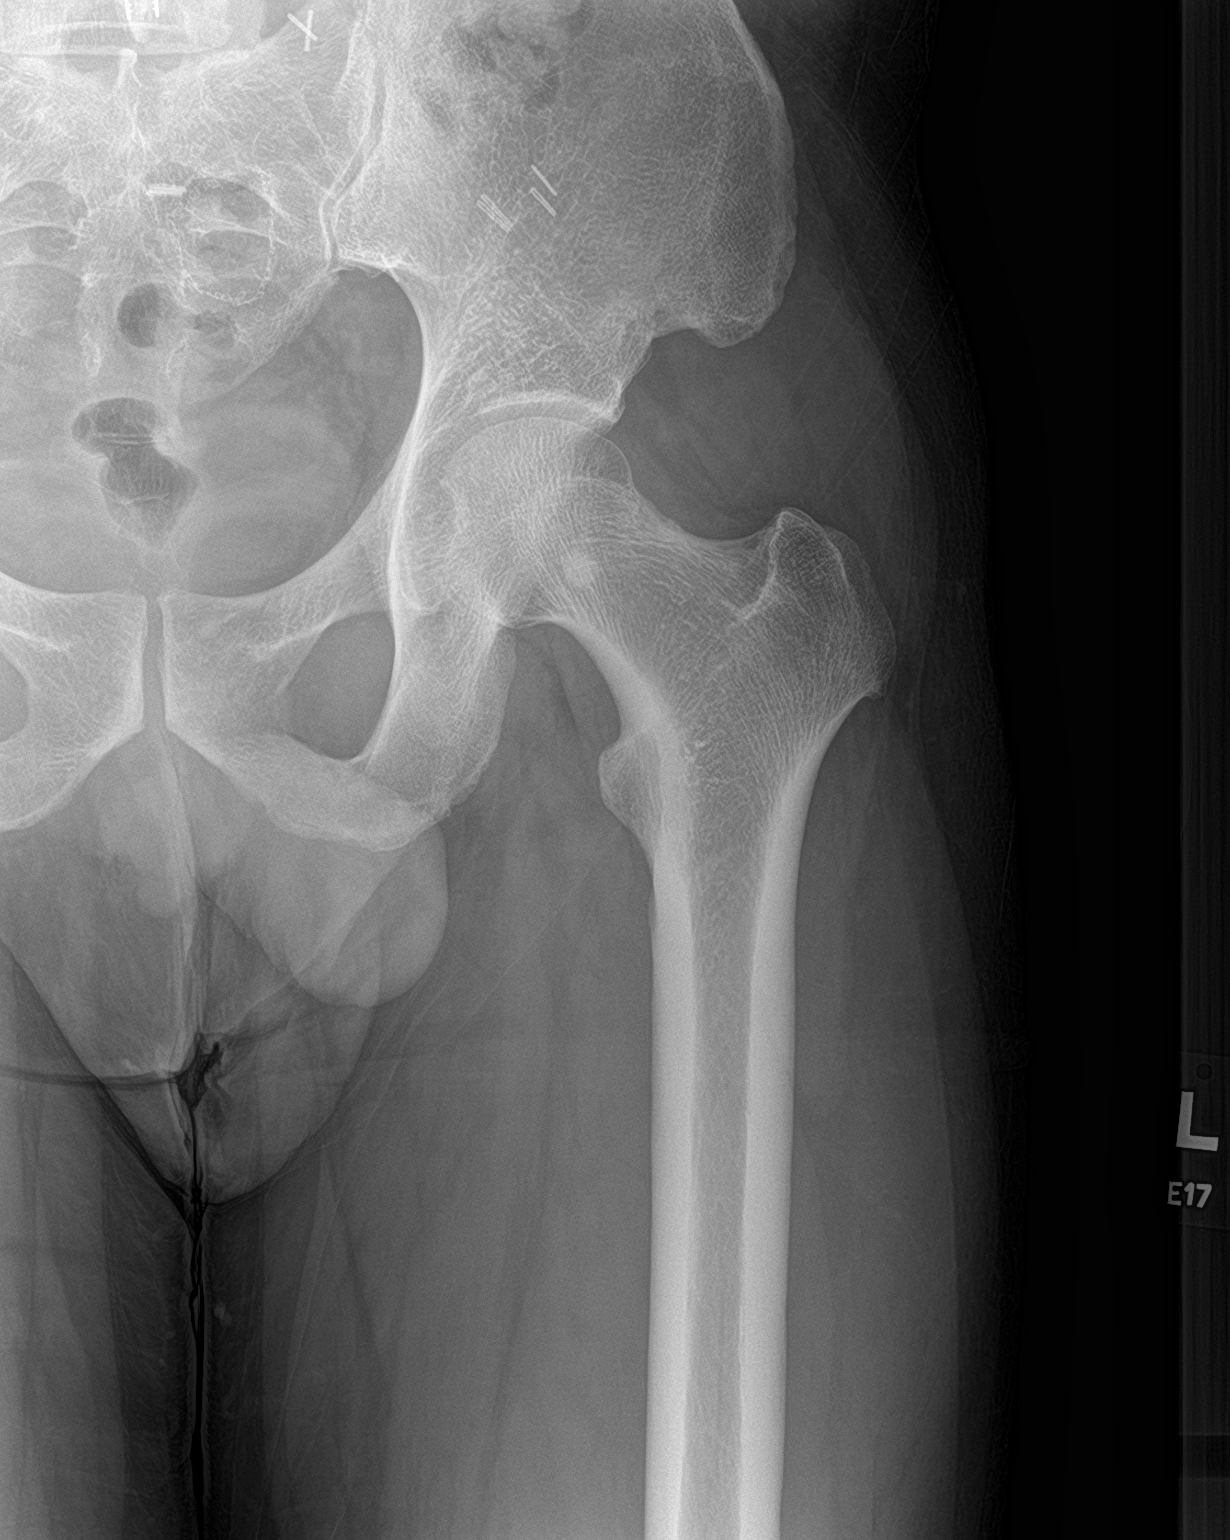
[im 2/3]
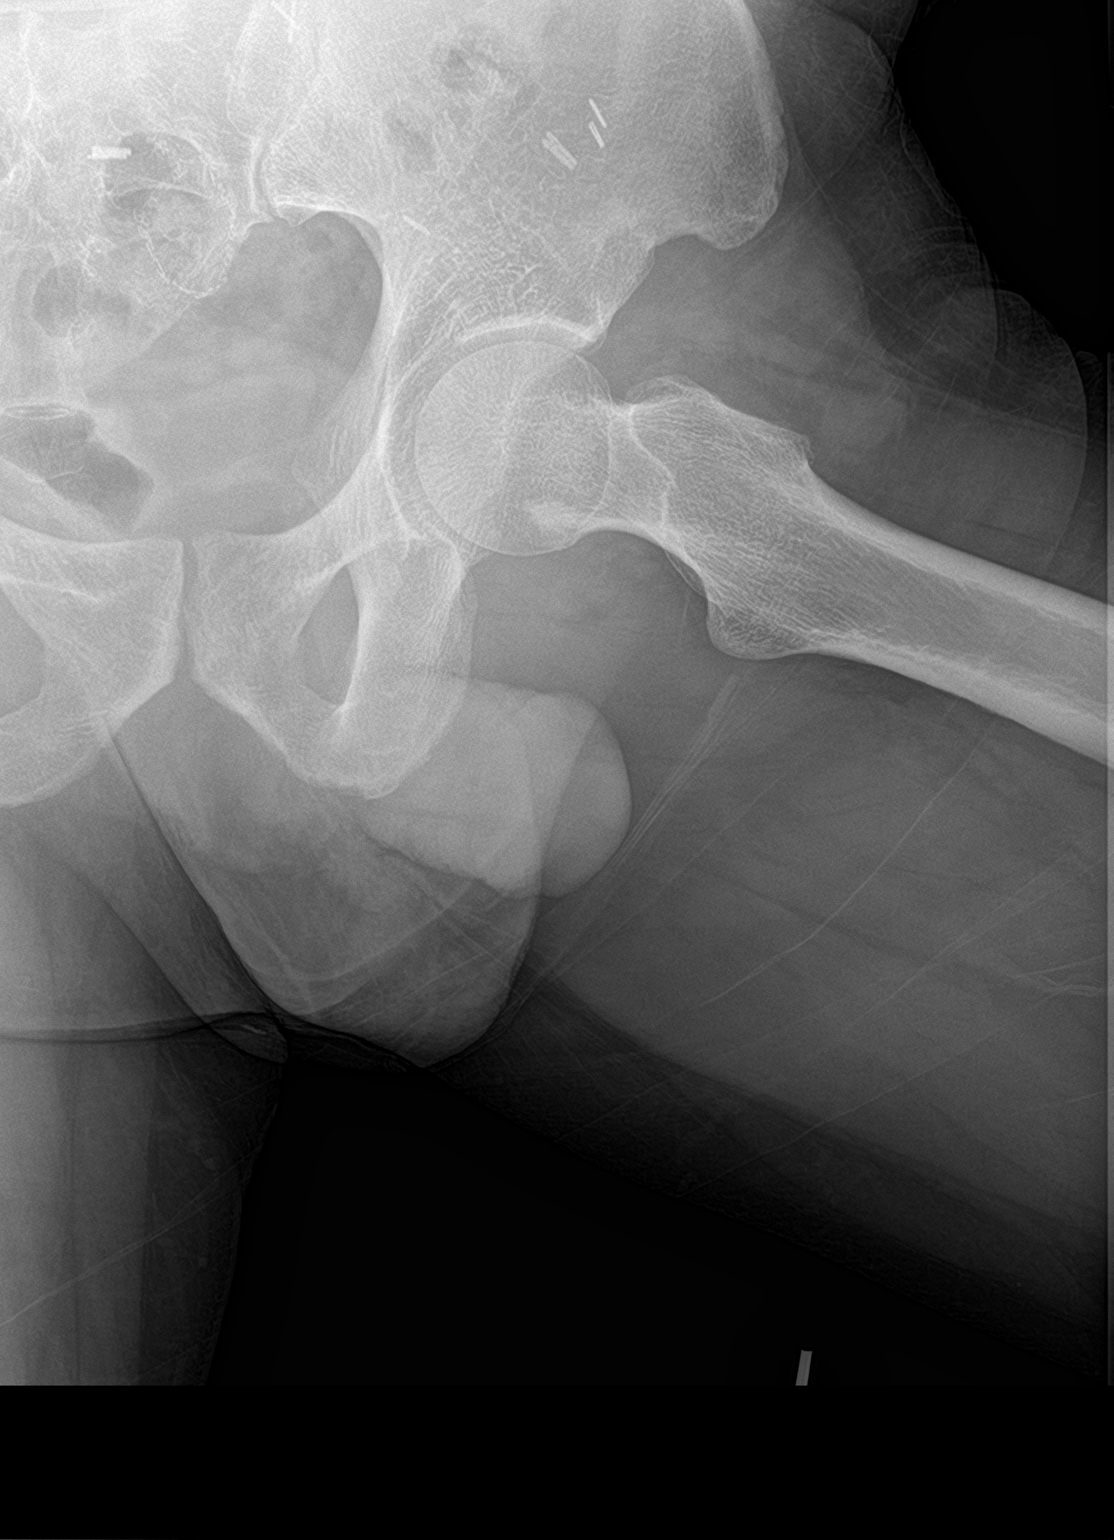
[im 3/3]
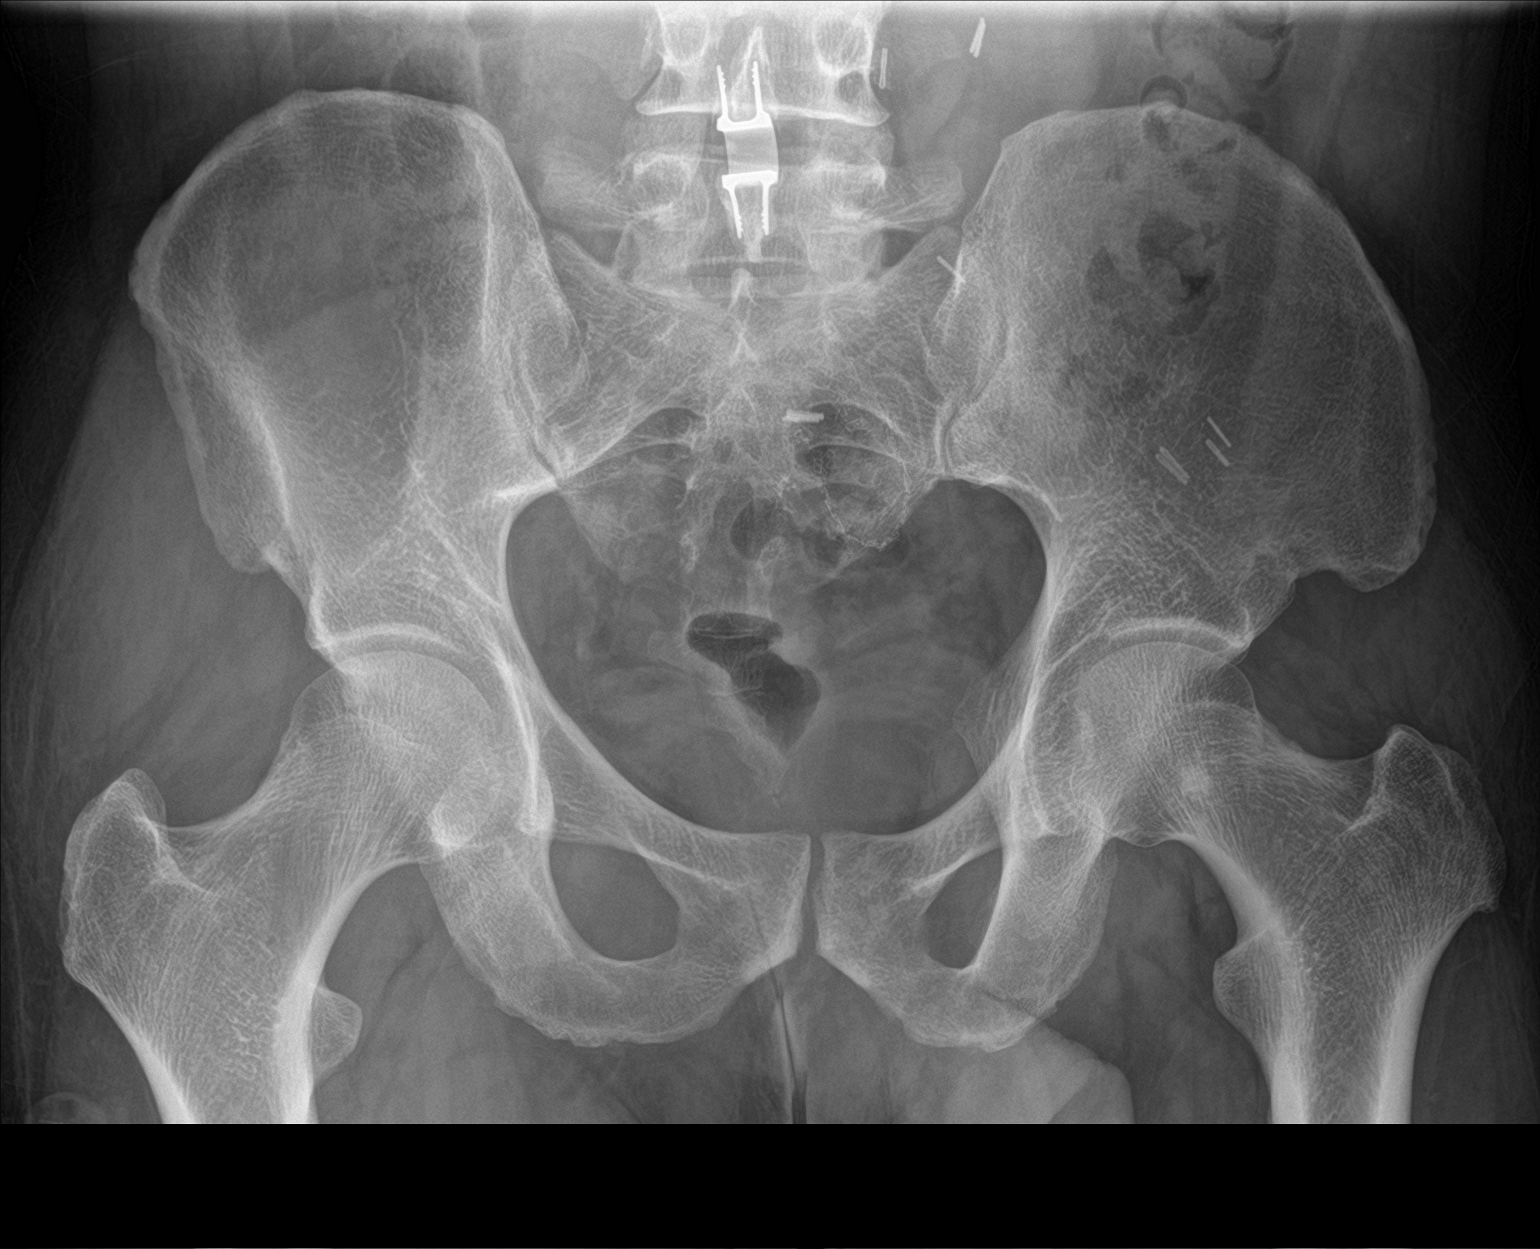

[3 of 3 positions shown; findings below may reference images not displayed]

FINDINGS: There is no evidence of hip fracture or dislocation. There is no
evidence of arthropathy or other focal bone abnormality. Rounded
hyperdensity in the proximal left femoral neck, likely a bone
island. Spinous process fusion at L4-L5.
IMPRESSION: Negative.

## 2021-09-12 IMAGING — CR DG HIP (WITH OR WITHOUT PELVIS) 2-3V*R*
1 series · 3 of 3 positions shown · non-contrast
Comparison: None.

CLINICAL DATA: Right hip pain arthralgia.

EXAM:
DG HIP (WITH OR WITHOUT PELVIS) 2-3V RIGHT

[Series 1: dg hip unilat w or w/o pelvis 2-3 views  · non-contrast · 0.14mm/px · 3 of 3 slices shown]
[im 1/3]
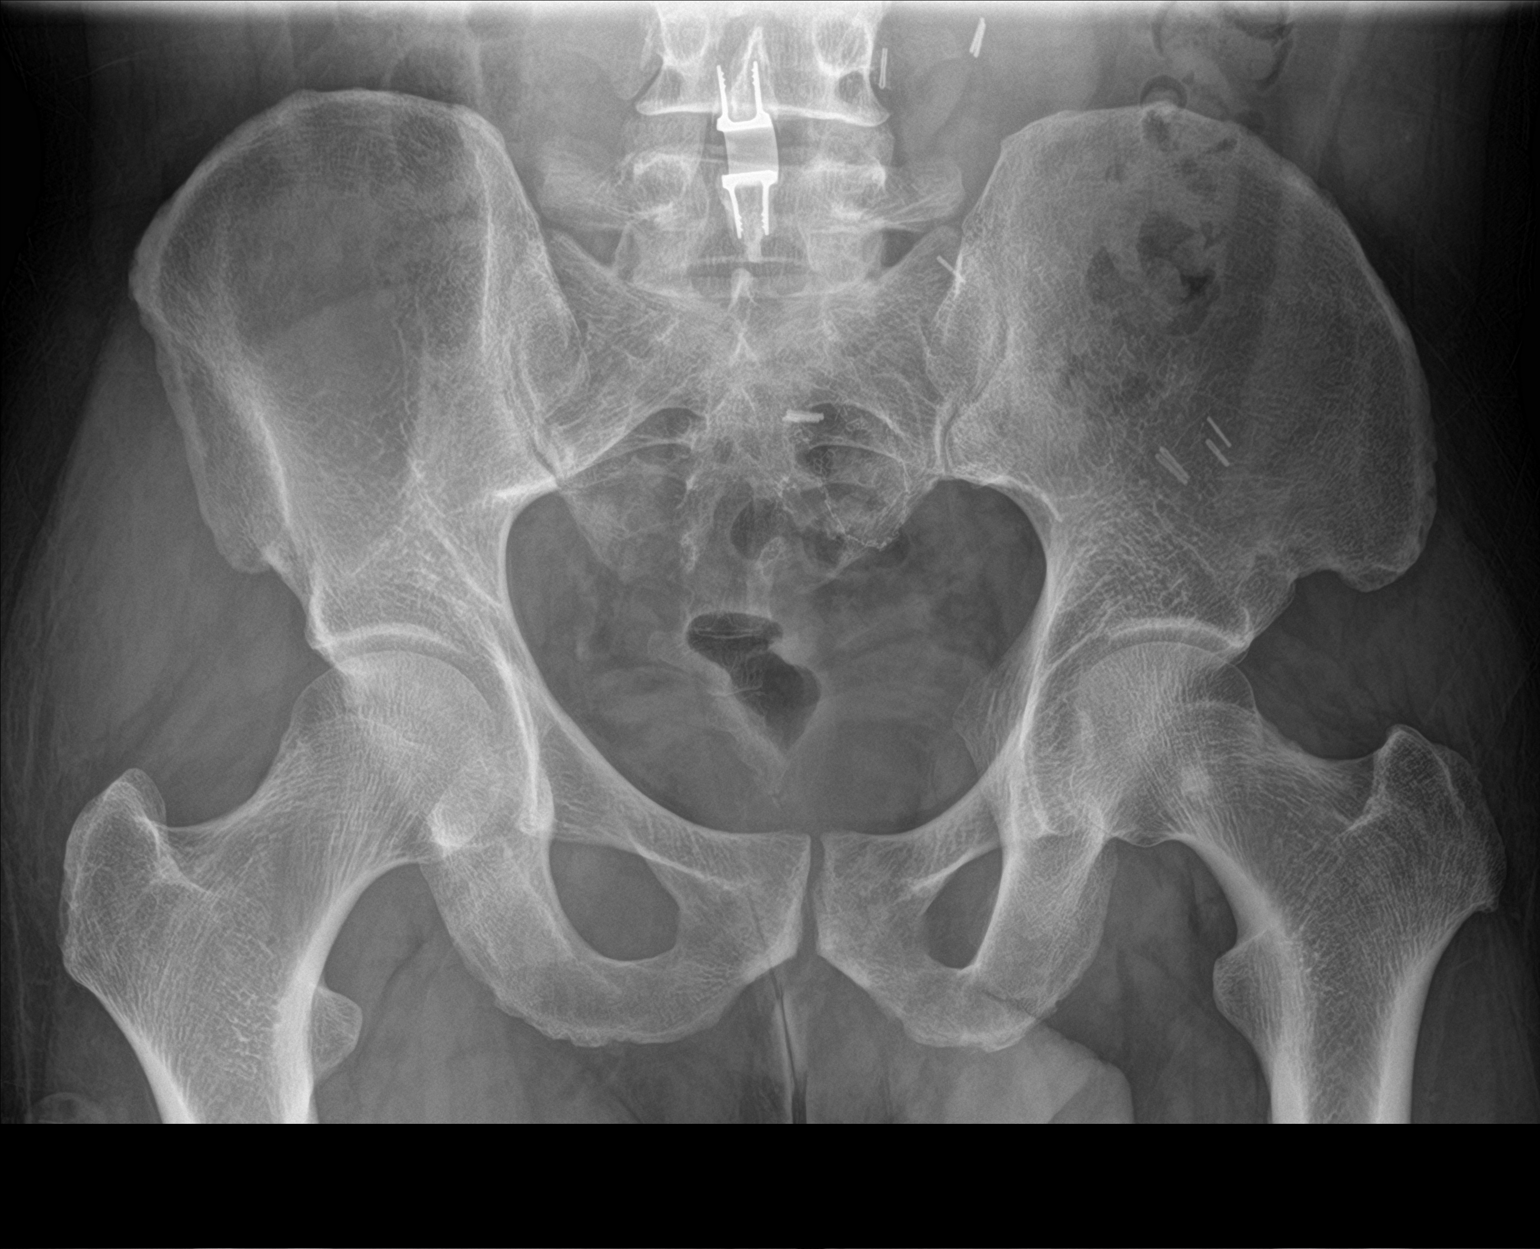
[im 2/3]
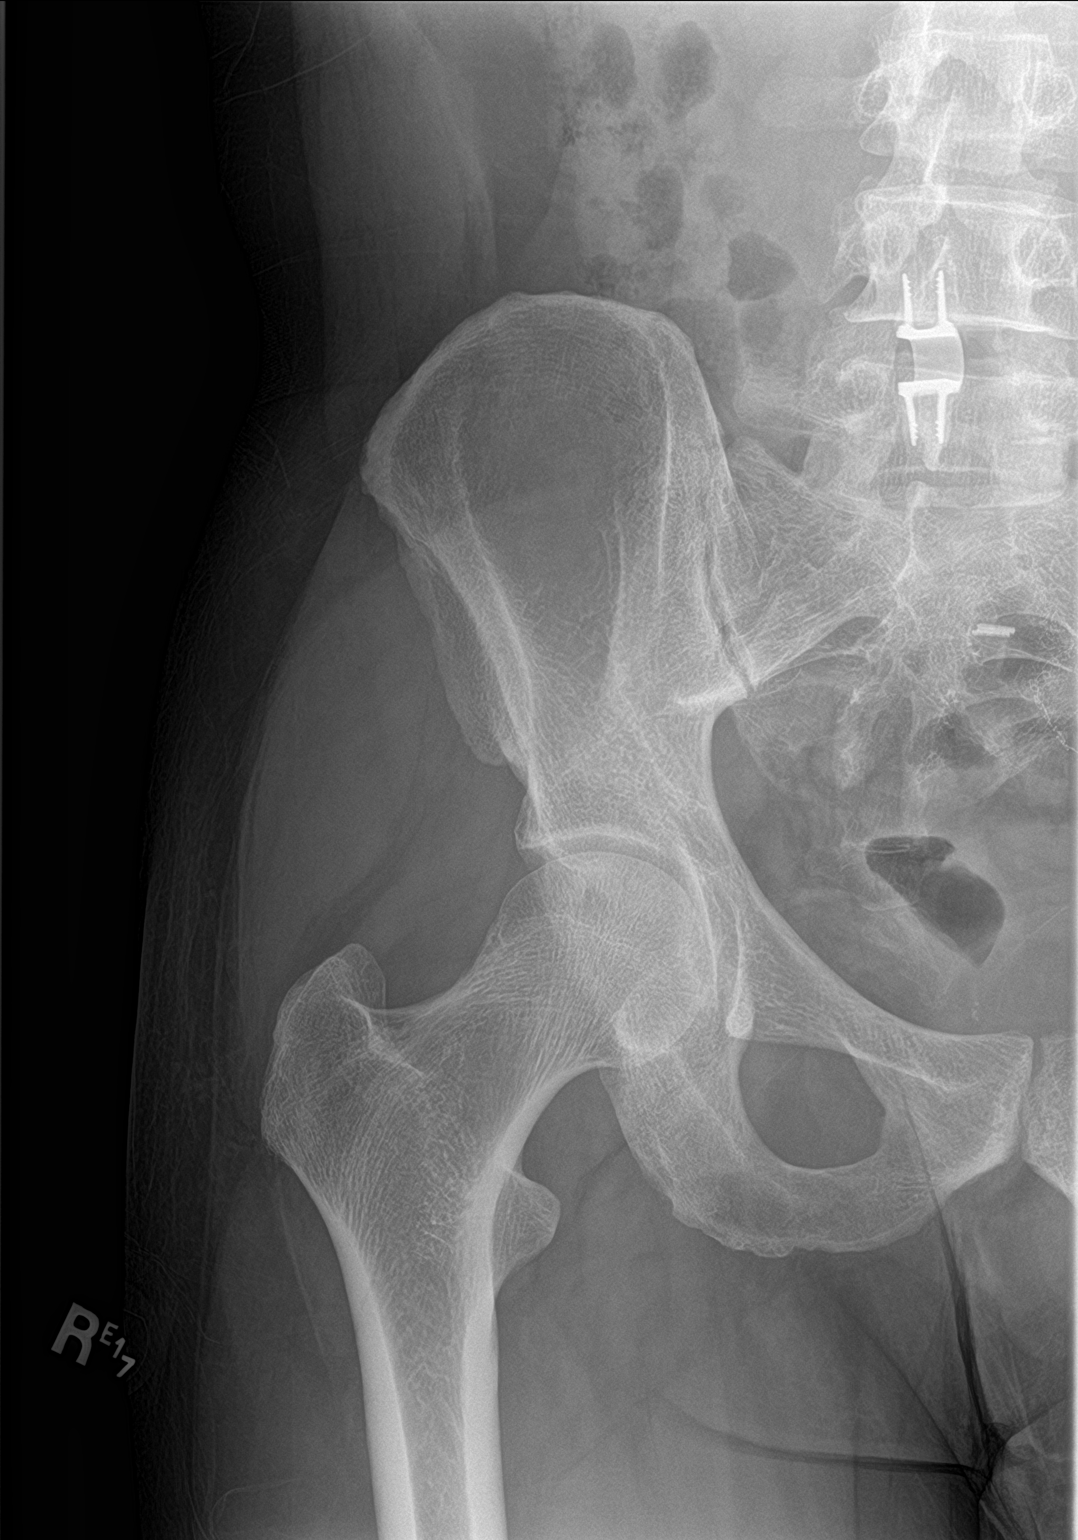
[im 3/3]
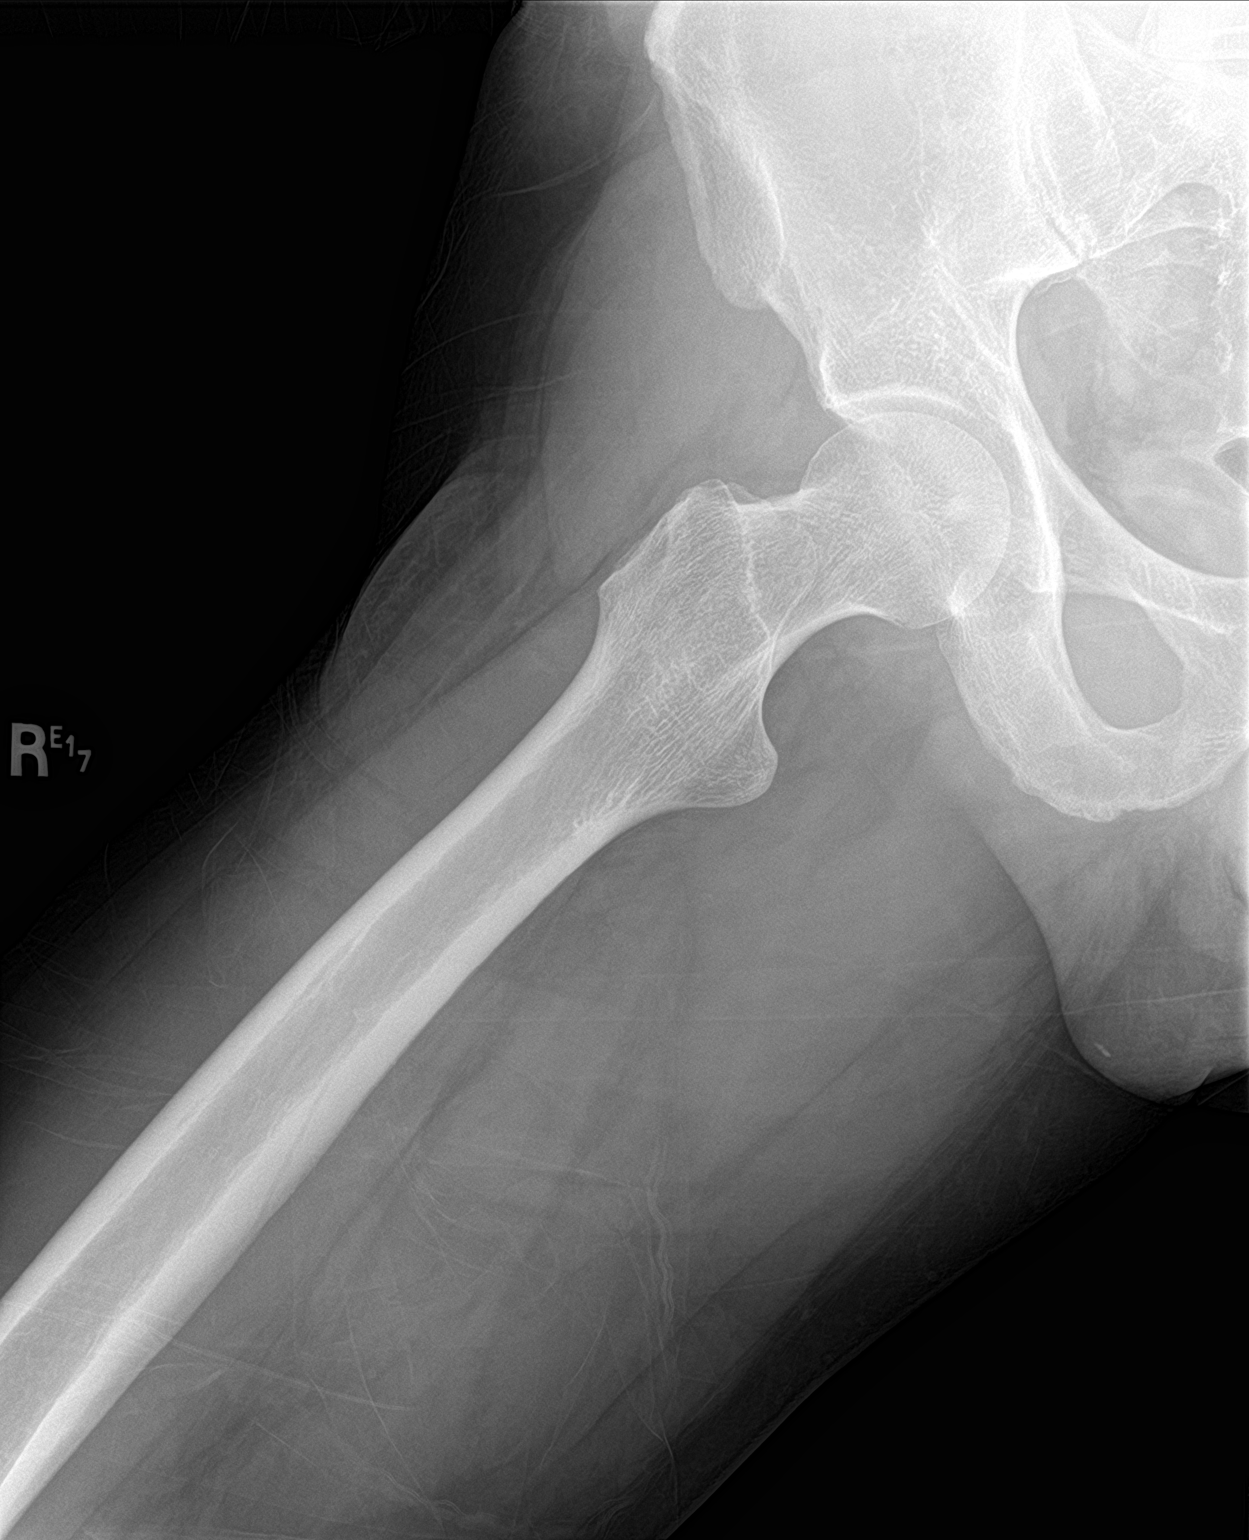

[3 of 3 positions shown; findings below may reference images not displayed]

FINDINGS: There is no evidence of hip fracture or dislocation. There is no
evidence of arthropathy or other focal bone abnormality. L4-L5
spinous process fusion.
IMPRESSION: No evidence of fracture, dislocation or arthropathy of the right
hip.

## 2021-09-12 IMAGING — CR DG LUMBAR SPINE COMPLETE W/ BEND
1 series · 7 of 7 positions shown · non-contrast
Comparison: Lumbar spine MRI dated [DATE].

CLINICAL DATA: Low back pain

EXAM:
LUMBAR SPINE - COMPLETE WITH BENDING VIEWS

[Series 1: dg lumbar spine complete w/bend 6+v · 0.14mm/px · 7 of 7 slices shown]
[im 1/7]
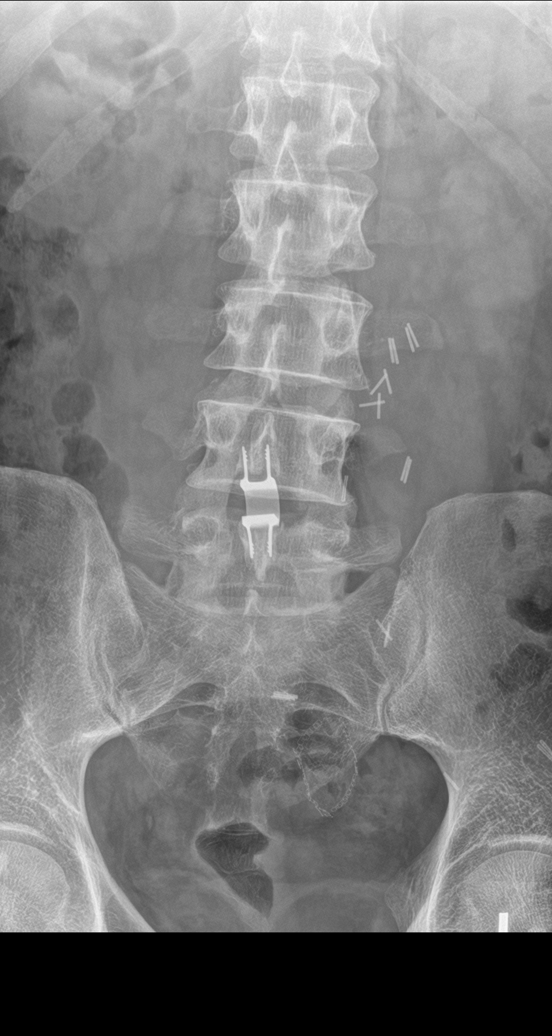
[im 2/7]
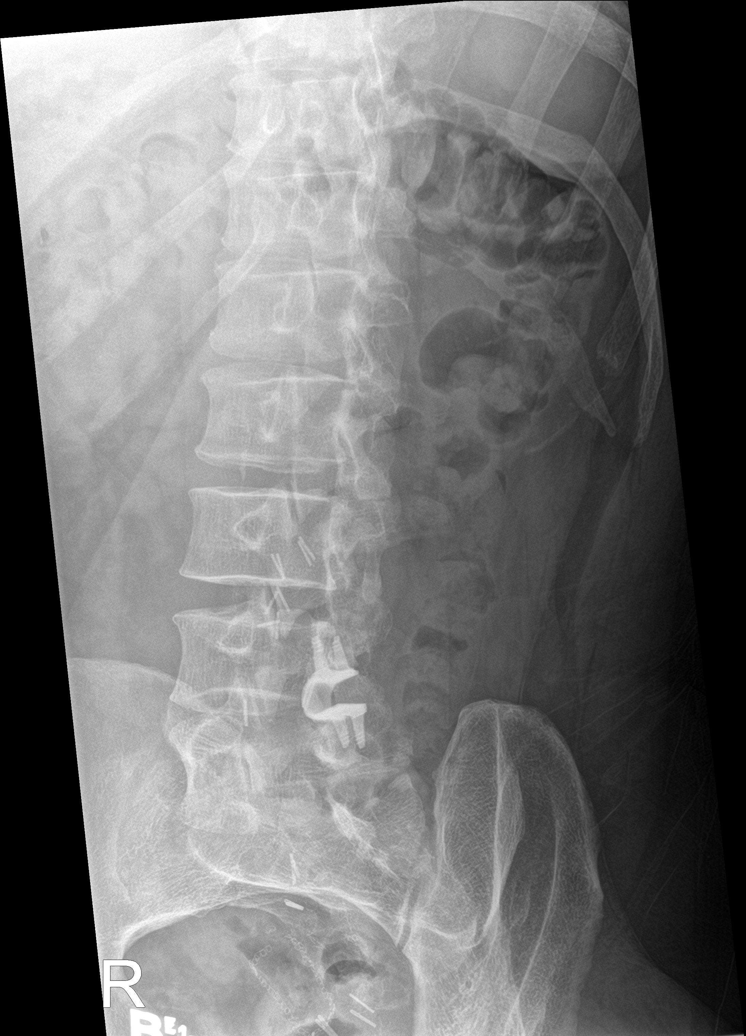
[im 3/7]
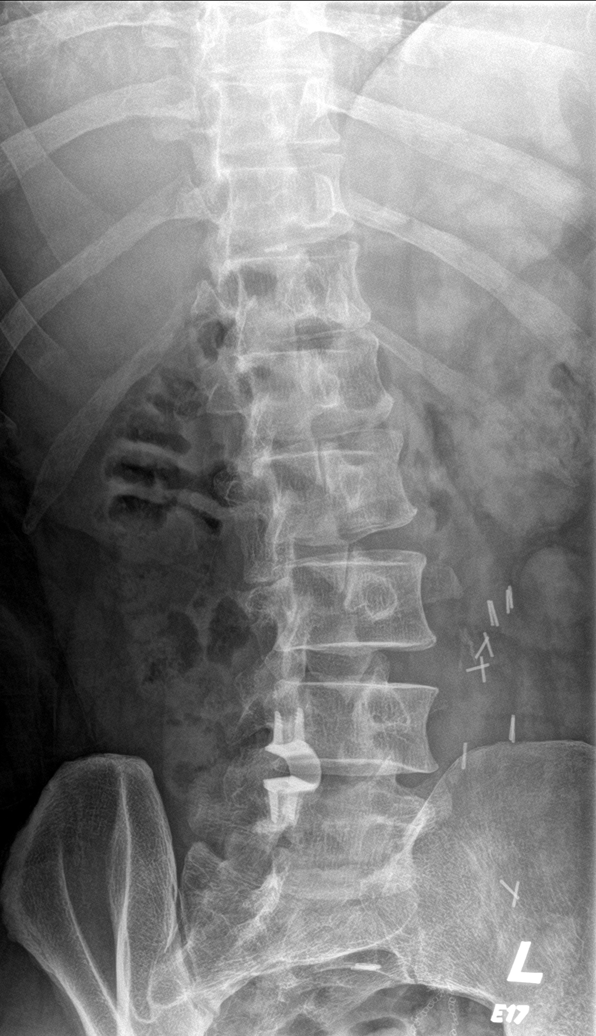
[im 4/7]
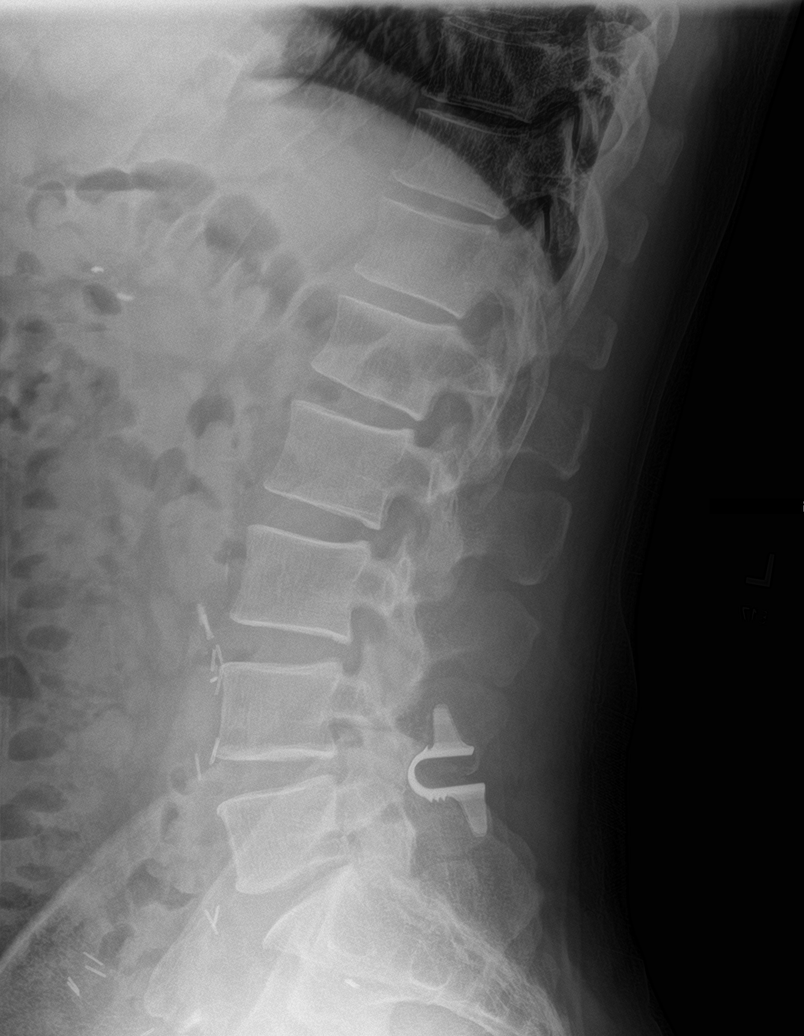
[im 5/7]
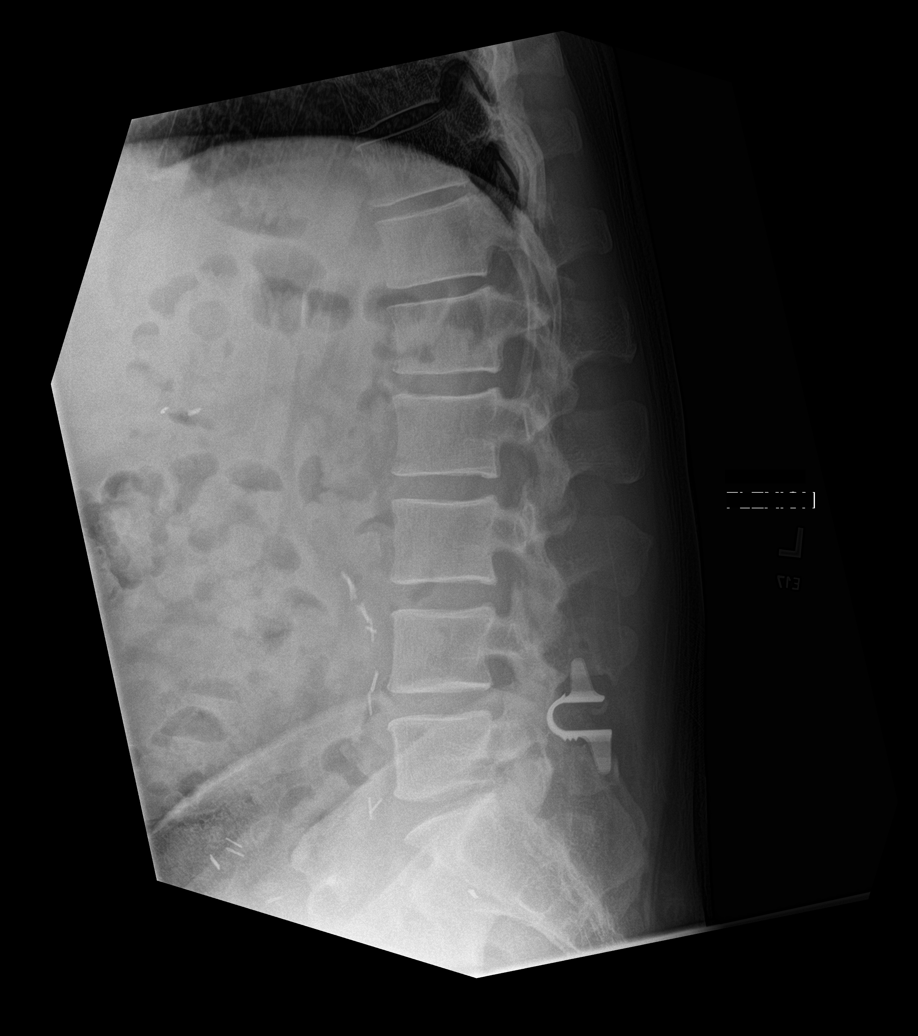
[im 6/7]
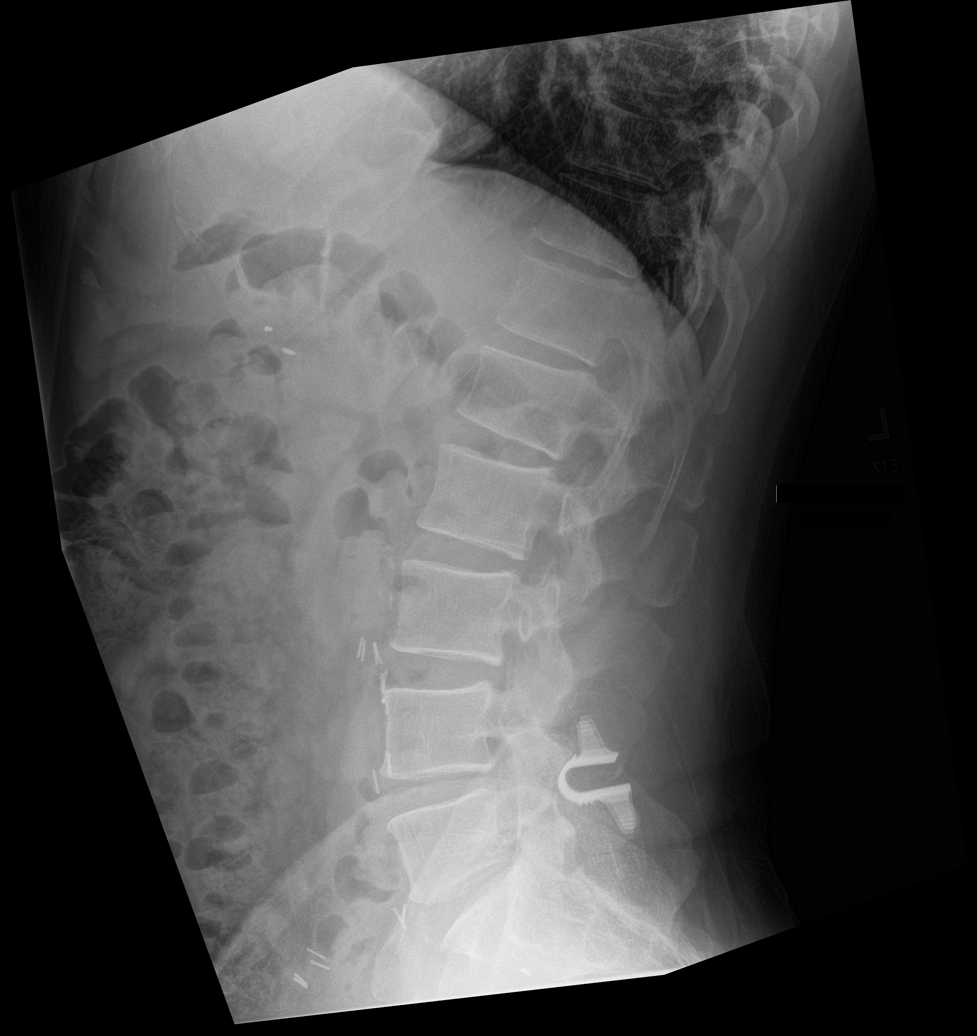
[im 7/7]
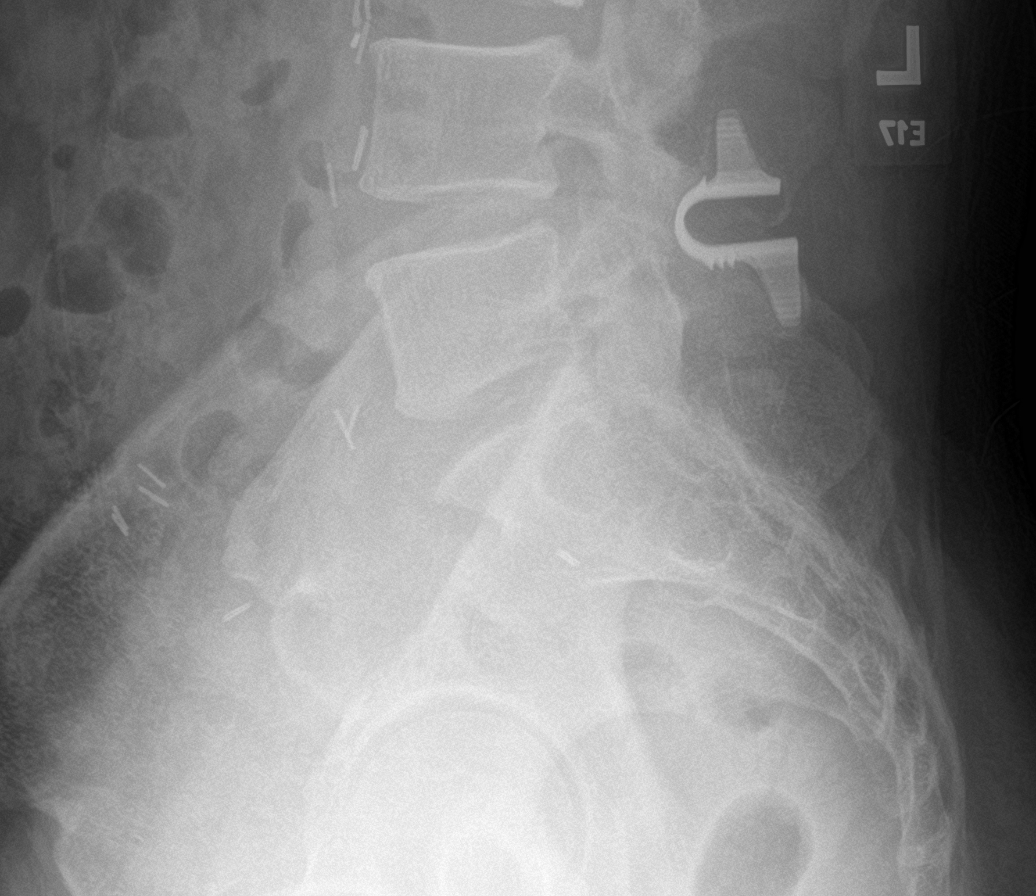

[7 of 7 positions shown; findings below may reference images not displayed]

FINDINGS: There is no evidence of lumbar spine fracture. Alignment is normal.
Mild degenerate disc disease at L4-L5 and L5-S1. Spinous process
fusion at L4-L5.
IMPRESSION: 1.  No acute fracture or subluxation.

2. Mild degenerate disc disease at L4-L5 and L5-S1. Spinous process
fusion at L4-L5.

## 2021-09-12 NOTE — Progress Notes (Signed)
Safety precautions to be maintained throughout the outpatient stay will include: orient to surroundings, keep bed in low position, maintain call bell within reach at all times, provide assistance with transfer out of bed and ambulation.  

## 2021-09-14 ENCOUNTER — Telehealth: Payer: Self-pay

## 2021-09-14 NOTE — Telephone Encounter (Signed)
Please schedule after 10/11/21 per Dr. Zigmund Daniel so we can review all the specialists notes and have a more meaningful visit.  Patient is currently scheduled for 09/24/21.

## 2021-09-14 NOTE — Telephone Encounter (Signed)
-----   Message from [name], MD sent at 09/13/2021  4:10 PM EST ----- Regarding: Reschedule After EMR review, I see that patient will be seeing cardiology and spinal surgeon after our next visit.   Instead, please reschedule him a few days after those visits (see chart) so we can review their notes and be able to have a more meaningful visit.  Thanks, JJM

## 2021-09-16 ENCOUNTER — Encounter (INDEPENDENT_AMBULATORY_CARE_PROVIDER_SITE_OTHER): Payer: Self-pay

## 2021-09-16 LAB — TESTOSTERONE, FREE, TOTAL, SHBG
Sex Hormone Binding: 16.7 nmol/L (ref 16.5–55.9)
Testosterone, Free: 12.4 pg/mL (ref 6.8–21.5)
Testosterone: 347 ng/dL (ref 264–916)

## 2021-09-16 LAB — MAGNESIUM: Magnesium: 2.2 mg/dL (ref 1.6–2.3)

## 2021-09-16 LAB — VITAMIN B12: Vitamin B-12: 372 pg/mL (ref 232–1245)

## 2021-09-16 LAB — SEDIMENTATION RATE: Sed Rate: 23 mm/hr — ABNORMAL HIGH (ref 0–15)

## 2021-09-16 LAB — C-REACTIVE PROTEIN: CRP: 2 mg/L (ref 0–10)

## 2021-09-17 LAB — COMPLIANCE DRUG ANALYSIS, UR

## 2021-09-24 ENCOUNTER — Ambulatory Visit: Payer: Managed Care, Other (non HMO) | Admitting: Family Medicine

## 2021-09-24 NOTE — Progress Notes (Deleted)
PROVIDER NOTE: Information contained herein reflects review and annotations entered in association with encounter. Interpretation of such information and data should be left to medically-trained personnel. Information provided to patient can be located elsewhere in the medical record under "Patient Instructions". Document created using STT-dictation technology, any transcriptional errors that may result from process are unintentional.    Patient: Mark Austin  Service Category: E/M  Provider: Oswaldo Done, MD  DOB: 08/04/1980  DOS: 09/26/2021  Specialty: Interventional Pain Management  MRN: [id number]  Setting: Ambulatory outpatient  PCP: Jerrol Banana, MD  Type: Established Patient    Referring Provider: Jerrol Banana, MD  Location: Office  Delivery: Face-to-face     Primary Reason(s) for Visit: Encounter for evaluation before starting new chronic pain management plan of care (Level of risk: moderate) CC: No chief complaint on file.  HPI  Mark Austin is a 41 y.o. year old, male patient, who comes today for a follow-up evaluation to review the test results and decide on a treatment plan. He has Sleep apnea; Constipation; Decreased hearing of right ear; Benign hypertension; Chronic abdominal pain; Migraine headache; Myalgia and myositis; Anxiety state; TIA (transient ischemic attack); Diverticulitis of colon (without mention of hemorrhage)(562.11); Polyp of sigmoid colon; Male erectile disorder; Obstructive sleep apnea of adult; Seizure-like activity (HCC); PTSD (post-traumatic stress disorder); Hypogonadism male; Chronic low back pain (1ry area of Pain) (Midline) w/ sciatica (Bilateral); Annual physical exam; Restless legs; Chronic lower extremity pain (2ry area of Pain) (Bilateral) (L>R); Primary osteoarthritis of knees, bilateral; History of lumbar surgery; Chronic pain syndrome; Pharmacologic therapy; Disorder of skeletal system; Problems influencing health status; Abnormal MRI, lumbar spine  (09/03/2021); 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 His primarily concern today is the No chief complaint on file.  Pain Assessment: Location:     Radiating:   Onset:   Duration:   Quality:   Severity:  /10 (subjective, self-reported pain score)  Effect on ADL:   Timing:   Modifying factors:   BP:    HR:    Mark Austin comes in today for a follow-up visit after his initial evaluation on 09/12/2021. Today we went over the results of his tests. These were explained in "Layman's terms". During today's appointment we went over my diagnostic impression, as well as the proposed treatment plan.  ***  In considering the treatment plan options, Mark Austin was reminded that I no longer take patients for medication management only. I asked him to let me know if he had no intention of taking advantage of the interventional therapies, so that we could make arrangements to provide this space to someone interested. I also made it clear that undergoing interventional therapies for the purpose of getting pain medications is very inappropriate on the part of a patient, and it will not be tolerated in this practice. This type of behavior would suggest true addiction and therefore it requires referral to an addiction specialist.   Further details on both, my assessment(s), as well as the proposed treatment plan, please see  below.  Controlled Substance Pharmacotherapy Assessment REMS (Risk Evaluation and Mitigation Strategy)  Opioid Analgesic: Oxycodone IR 5 mg tablet, 1 tab p.o. every 6 hours (last filled 09/03/2021) MME/day: 30 mg/day  Pill Count: None expected due to no prior prescriptions written by our practice. No notes on file Pharmacokinetics: Liberation and absorption (onset of action): WNL Distribution (time to peak effect): WNL Metabolism and excretion (duration of action): WNL         Pharmacodynamics: Desired effects: Analgesia: Mark Austin reports >50% benefit. Functional ability: Patient reports that medication allows him to accomplish basic ADLs Clinically meaningful improvement in function (CMIF): Sustained CMIF goals met Perceived effectiveness: Described as relatively effective, allowing for increase in activities of daily living (ADL) Undesirable effects: Side-effects or Adverse reactions: None reported Monitoring: [place] PMP: PDMP reviewed during this encounter. Online review of the past 96-month period previously conducted. Not applicable at this point since we have not taken over the patient's medication management yet. List of other Serum/Urine Drug Screening Test(s):  No results found for: AMPHSCRSER, BARBSCRSER, BENZOSCRSER, COCAINSCRSER, COCAINSCRNUR, PCPSCRSER, THCSCRSER, THCU, CANNABQUANT, OPIATESCRSER, OXYSCRSER, PROPOXSCRSER, ETH List of all UDS test(s) done:  Lab Results  Component Value Date   SUMMARY Note 09/12/2021   Last UDS on record: Summary  Date Value Ref Range Status  09/12/2021 Note  Final    Comment:    ==================================================================== Compliance Drug Analysis, Ur ==================================================================== Test                             Result       Flag       Units  Drug Present and Declared for Prescription Verification   Cyclobenzaprine                PRESENT      EXPECTED    Desmethylcyclobenzaprine       PRESENT      EXPECTED    Desmethylcyclobenzaprine is an expected metabolite of    cyclobenzaprine.    Propranolol                    PRESENT      EXPECTED  Drug Present not Declared for Prescription Verification   Carboxy-THC                    >1124        UNEXPECTED ng/mg creat    Carboxy-THC is a metabolite of tetrahydrocannabinol (THC). Source of    THC is most commonly herbal marijuana or marijuana-based products,    but THC is also present in a scheduled prescription medication.    Trace amounts of THC can be present in hemp and cannabidiol (CBD)    products. This test is not intended to distinguish between delta-9-    tetrahydrocannabinol, the predominant form of THC in most herbal or    marijuana-based products, and delta-8-tetrahydrocannabinol.    Acetaminophen                  PRESENT      UNEXPECTED  Drug Absent but Declared for Prescription Verification   Lorazepam                      Not Detected UNEXPECTED ng/mg creat   Morphine                       Not Detected UNEXPECTED ng/mg creat   Tapentadol  Not Detected UNEXPECTED ng/mg creat   Methylphenidate                Not Detected UNEXPECTED   Carisoprodol                   Not Detected UNEXPECTED   Lurasidone                     Not Detected UNEXPECTED   Salicylate                     Not Detected UNEXPECTED    Aspirin, as indicated in the declared medication list, is not always    detected even when used as directed.  ==================================================================== Test                      Result    Flag   Units      Ref Range   Creatinine              89               mg/dL      >=78 ==================================================================== Declared Medications:  The flagging and interpretation on this report are based on the  following declared medications.  Unexpected results may arise from  inaccuracies in the declared  medications.   **Note: The testing scope of this panel includes these medications:   Carisoprodol (Soma)  Cyclobenzaprine (Flexeril)  Lorazepam (Ativan)  Lurasidone (Latuda)  Methylphenidate (Ritalin)  Morphine (MSIR)  Propranolol (Inderal)  Tapentadol (Nucynta)   **Note: The testing scope of this panel does not include small to  moderate amounts of these reported medications:   Aspirin   **Note: The testing scope of this panel does not include the  following reported medications:   Anastrozole (Arimidex)  Dicyclomine (Bentyl)  Docusate (Senokot-S)  Eye Drop  Ezetimibe (Zetia)  Famotidine  Lisinopril (Zestril)  Ondansetron (Zofran)  Prazosin (Minipress)  Sennosides (Senokot-S)  Spironolactone (Aldactone)  Tadalafil (Cialis)  Testosterone  Varenicline (Chantix)  Vitamin D2 (Drisdol) ==================================================================== For clinical consultation, please call [phone]. ====================================================================    UDS interpretation: No unexpected findings.          Medication Assessment Form: Patient introduced to form today Treatment compliance: Treatment may start today if patient agrees with proposed plan. Evaluation of compliance is not applicable at this point Risk Assessment Profile: Aberrant behavior: See initial evaluations. None observed or detected today Comorbid factors increasing risk of overdose: See initial evaluation. No additional risks detected today Opioid risk tool (ORT):  Opioid Risk  09/12/2021  Alcohol 0  Illegal Drugs 0  Rx Drugs 0  Alcohol 0  Illegal Drugs 0  Rx Drugs 0  Age between 16-45 years  0  History of Preadolescent Sexual Abuse 0  Psychological Disease 2  Opioid Risk Tool Scoring 2  Opioid Risk Interpretation Low Risk    ORT Scoring interpretation table:  Score <3 = Low Risk for SUD  Score between 4-7 = Moderate Risk for SUD  Score >8 = High Risk for Opioid  Abuse   Risk of substance use disorder (SUD): Low  Risk Mitigation Strategies:  Patient opioid safety counseling: Completed today. Counseling provided to patient as per "Patient Counseling Document". Document signed by patient, attesting to counseling and understanding Patient-Prescriber Agreement (PPA): Obtained today.  Controlled substance notification to other providers: Written and sent today.  Pharmacologic Plan: Non-opioid analgesic therapy offered. Interventional alternatives discussed.  Laboratory Chemistry Profile   Renal Lab Results  Component Value Date   BUN 12 09/03/2021   CREATININE 1.01 09/03/2021   BCR 11 07/19/2021   GFRAA >60 02/18/2020   GFRNONAA >60 09/03/2021   PROTEINUR NEGATIVE 09/03/2021     Electrolytes Lab Results  Component Value Date   NA 137 09/03/2021   K 4.4 09/03/2021   CL 102 09/03/2021   CALCIUM 9.5 09/03/2021   MG 2.2 09/12/2021     Hepatic Lab Results  Component Value Date   AST 18 07/19/2021   ALT 13 07/19/2021   ALBUMIN 4.6 07/19/2021   ALKPHOS 59 07/19/2021   LIPASE 63 (H) 04/06/2021     ID Lab Results  Component Value Date   HIV Non Reactive 07/19/2021   SARSCOV2NAA NEGATIVE 11/26/2019     Bone Lab Results  Component Value Date   VD25OH 30.9 07/19/2021   TESTOFREE 12.4 09/12/2021   TESTOSTERONE 347 09/12/2021     Endocrine Lab Results  Component Value Date   GLUCOSE 89 09/03/2021   GLUCOSEU NEGATIVE 09/03/2021   TSH 0.894 07/19/2021   TESTOFREE 12.4 09/12/2021   TESTOSTERONE 347 09/12/2021   SHBG 16.7 09/12/2021     Neuropathy Lab Results  Component Value Date   VITAMINB12 372 09/12/2021   HIV Non Reactive 07/19/2021     CNS No results found for: COLORCSF, APPEARCSF, RBCCOUNTCSF, WBCCSF, POLYSCSF, LYMPHSCSF, EOSCSF, PROTEINCSF, GLUCCSF, JCVIRUS, CSFOLI, IGGCSF, LABACHR, ACETBL, LABACHR, ACETBL   Inflammation (CRP: Acute  ESR: Chronic) Lab Results  Component Value Date   CRP 2 09/12/2021    ESRSEDRATE 23 (H) 09/12/2021     Rheumatology No results found for: RF, ANA, LABURIC, URICUR, LYMEIGGIGMAB, LYMEABIGMQN, HLAB27   Coagulation Lab Results  Component Value Date   PLT 209 09/03/2021     Cardiovascular Lab Results  Component Value Date   CKTOTAL 643 (H) 10/22/2020   HGB 17.2 (H) 09/03/2021   HCT 51.3 09/03/2021     Screening Lab Results  Component Value Date   SARSCOV2NAA NEGATIVE 11/26/2019   HIV Non Reactive 07/19/2021     Cancer No results found for: CEA, CA125, LABCA2   Allergens No results found for: ALMOND, APPLE, ASPARAGUS, AVOCADO, BANANA, BARLEY, BASIL, BAYLEAF, GREENBEAN, LIMABEAN, WHITEBEAN, BEEFIGE, REDBEET, BLUEBERRY, BROCCOLI, CABBAGE, MELON, CARROT, CASEIN, CASHEWNUT, CAULIFLOWER, CELERY     Note: Lab results reviewed.  Recent Diagnostic Imaging Review  Cervical Imaging: Cervical MR wo contrast: No results found for this or any previous visit.  Cervical MR wo contrast: No valid procedures specified. Cervical MR w/wo contrast: No results found for this or any previous visit.  Cervical CT wo contrast: No results found for this or any previous visit.  Cervical CT outside: No results found for this or any previous visit.  Cervical DG F/E views: No results found for this or any previous visit.  Cervical DG Bending/F/E views: No results found for this or any previous visit.   Shoulder Imaging: Shoulder-R MR w/wo contrast: No results found for this or any previous visit.  Shoulder-L MR w/wo contrast: No results found for this or any previous visit.  Shoulder-R MR wo contrast: No results found for this or any previous visit.  Shoulder-L MR wo contrast: No results found for this or any previous visit.  Shoulder-R DG: No results found for this or any previous visit.  Shoulder-L DG: No results found for this or any previous visit.   Thoracic Imaging: Thoracic MR wo contrast: No results found for this  or any previous  visit.  Thoracic MR wo contrast: No valid procedures specified. Thoracic MR w/wo contrast: No results found for this or any previous visit.  Thoracic CT wo contrast: No results found for this or any previous visit.  Thoracic CT w/wo contrast: No results found for this or any previous visit.  Thoracic CT w/wo contrast: No results found for this or any previous visit.  Thoracic DG 4 views: No results found for this or any previous visit.  Thoracic DG: No results found for this or any previous visit.  Thoracic DG w/swimmers view: No results found for this or any previous visit.   Lumbosacral Imaging: Lumbar MR wo contrast: Results for orders placed during the hospital encounter of 09/03/21  MR LUMBAR SPINE WO CONTRAST  Narrative CLINICAL DATA:  Acute low back pain with bilateral leg numbness, difficulty walking, and urinary incontinence.  EXAM: MRI LUMBAR SPINE WITHOUT CONTRAST  TECHNIQUE: Multiplanar, multisequence MR imaging of the lumbar spine was performed. No intravenous contrast was administered.  COMPARISON:  Lumbar spine x-rays dated July 17, 2021. MRI lumbar spine dated August 03, 2020.  FINDINGS: Segmentation:  Standard.  Alignment:  Physiologic.  Vertebrae: No fracture, evidence of discitis, or bone lesion. Unchanged interspinous spacer at L4-L5.  Conus medullaris and cauda equina: Conus extends to the L1 level. Conus and cauda equina appear normal.  Paraspinal and other soft tissues: Negative.  Disc levels:  T12-L1 to L3-L4:  Negative.  L4-L5: Prior posterior decompression and interspinous spacer placement. Unchanged small shallow central disc protrusion with annular fissure. Unchanged mild bilateral facet arthropathy. Unchanged moderate left and mild right neuroforaminal stenosis. No spinal canal stenosis.  L5-S1: Prior right hemilaminectomy. Unchanged mild disc bulging with superimposed small right paracentral disc protrusion and  annular fissure encroaching on the descending right S1 nerve root. Unchanged mild right lateral recess stenosis. No spinal canal or neuroforaminal stenosis.  IMPRESSION: 1. 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 2. 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   Electronically Signed By: Obie Dredge M.D. On: 09/03/2021 13:48  Lumbar MR wo contrast: No valid procedures specified. Lumbar MR w/wo contrast: No results found for this or any previous visit.  Lumbar CT wo contrast: No results found for this or any previous visit.  Lumbar CT w/wo contrast: No results found for this or any previous visit.  Lumbar CT w/wo contrast: No results found for this or any previous visit.  Lumbar DG (Complete) 4+V: Results for orders placed during the hospital encounter of 07/12/21  DG Lumbar Spine Complete  Narrative CLINICAL DATA:  Low back pain with tingling in both legs. History of back surgery.  EXAM: LUMBAR SPINE - COMPLETE 4+ VIEW  COMPARISON:  CT 01/15/2021.  MRI lumbar spine 08/03/2020.  FINDINGS: Lumbar spine numbered the lowest segmented appearing lumbar shaped vertebrae on lateral view as L5. L4-L5 spinous process fusion again noted. Loosening of the prosthetic device cannot be excluded. Diffuse mild degenerative change. No acute bony abnormality. Surgical clips and sutures noted throughout the abdomen and pelvis. Moderate stool volume.  IMPRESSION: 1. L4-L5 spinous process fusion again noted. Loosening of the prosthetic device cannot be excluded.  2.  Diffuse mild degenerative change.  No acute bony abnormality.  3.  Moderate stool volume.   Electronically Signed By: Maisie Fus  Register M.D. On: 07/18/2021 05:41  Lumbar DG F/E views: No results found for this or any previous visit.         Lumbar DG Bending views: Results for orders placed during the hospital encounter of 09/12/21  DG Lumbar Spine Complete W/Bend  Narrative CLINICAL DATA:  Low back pain  EXAM: LUMBAR SPINE - COMPLETE WITH BENDING VIEWS  COMPARISON:  Lumbar spine MRI dated September 03, 2021.  FINDINGS: There is no evidence of lumbar spine fracture. Alignment is normal. 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 Spinous process fusion at L4-L5.  IMPRESSION: 1.  No acute fracture or subluxation.  2. 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 Spinous process fusion at L4-L5.   Electronically Signed By: Larose Hires D.O. On: 09/12/2021 17:38        Lumbar DG Myelogram views: No results found for this or any previous visit.  Lumbar DG Myelogram: No results found for this or any previous visit.  Lumbar DG Myelogram: No results found for this or any previous visit.  Lumbar DG Myelogram: No results found for this or any previous visit.  Lumbar DG Myelogram Lumbosacral: No results found for this or any previous visit.   Sacroiliac Joint Imaging: Sacroiliac Joint DG: No results found for this or any previous visit.  Sacroiliac Joint MR w/wo contrast: No results found for this or any previous visit.  Sacroiliac Joint MR wo contrast: No results found for this or any previous visit.   Spine Imaging: MRA Spinal Canal w/ cm: No results found for this or any previous visit.  MRA Spinal Canal wo/ cm: No valid procedures specified. MRA Spinal Canal w/wo cm: No results found for this or any previous visit.   Hip Imaging: Hip-R MR w/wo contrast: No results found for this or any previous visit.  Hip-L MR w/wo contrast: No results found for this or any previous visit.  Hip-R MR wo contrast: No results found for this or any previous visit.  Hip-L MR wo contrast: No results found for this or any previous visit.  Hip-R CT w/wo contrast: No results found for this or any previous  visit.  Hip-L CT w/wo contrast: No results found for this or any previous visit.  Hip-R CT wo contrast: No results found for this or any previous visit.  Hip-L CT wo contrast: No results found for this or any previous visit.  Hip-R DG 2-3 views: Results for orders placed during the hospital encounter of 09/12/21  DG HIP UNILAT W OR W/O PELVIS 2-3 VIEWS RIGHT  Narrative CLINICAL DATA:  Right hip pain arthralgia.  EXAM: DG HIP (WITH OR WITHOUT PELVIS) 2-3V RIGHT  COMPARISON:  None.  FINDINGS: There is no evidence of hip fracture or dislocation. There is no evidence of arthropathy or other focal bone abnormality. L4-L5 spinous process fusion.  IMPRESSION: No evidence of fracture, dislocation or arthropathy of the right hip.   Electronically Signed By: Larose Hires D.O. On: 09/12/2021 17:33  Hip-L DG 2-3 views: Results for orders placed during the hospital encounter of 09/12/21  DG HIP UNILAT W OR W/O PELVIS 2-3 VIEWS LEFT  Narrative CLINICAL DATA:  Hip pain  EXAM: DG HIP (WITH OR WITHOUT PELVIS) 2-3V LEFT  COMPARISON:  None.  FINDINGS: There is no evidence of hip fracture or dislocation. There is no evidence of arthropathy or other focal bone abnormality. Rounded hyperdensity in the proximal left femoral neck, likely a bone island. Spinous process fusion at L4-L5.  IMPRESSION: Negative.   Electronically  Signed By: Larose Hires D.O. On: 09/12/2021 17:35  Hip-B DG Bilateral: No results found for this or any previous visit.   Knee Imaging: Knee-R MR w/wo contrast: No results found for this or any previous visit.  Knee-L MR w/wo contrast: No results found for this or any previous visit.  Knee-R MR wo contrast: No results found for this or any previous visit.  Knee-L MR wo contrast: No results found for this or any previous visit.  Knee-R CT w/wo contrast: No results found for this or any previous visit.  Knee-L CT w/wo contrast: No results found for  this or any previous visit.  Knee-R CT wo contrast: No results found for this or any previous visit.  Knee-L CT wo contrast: No results found for this or any previous visit.  Knee-R DG 4 views: Results for orders placed during the hospital encounter of 07/17/21  DG Knee Complete 4 Views Right  Narrative CLINICAL DATA:  History of bilateral knee pain.  EXAM: RIGHT KNEE - COMPLETE 4+ VIEW  COMPARISON:  Prior right knee series report 07/13/2020.  FINDINGS: Mild lateral subluxation of the tibia, this may be degenerative. No evidence of fracture or dislocation. No evidence of effusion.  IMPRESSION: Mild lateral subluxation of the tibia, this may be degenerative. No evidence of fracture or dislocation.   Electronically Signed By: Maisie Fus  Register M.D. On: 07/18/2021 05:44  Knee-L DG 4 views: Results for orders placed during the hospital encounter of 07/17/21  DG Knee Complete 4 Views Left  Narrative CLINICAL DATA:  History of knee pain.  EXAM: LEFT KNEE - COMPLETE 4+ VIEW  COMPARISON:  Left knee series report 07/13/2020.  FINDINGS: Mild lateral subluxation of the tibia, this may be degenerative. No acute bony or joint abnormality. No evidence of effusion.  IMPRESSION: Mild lateral subluxation of the tibia, this may be degenerative. No acute bony or joint abnormality identified.   Electronically Signed By: Maisie Fus  Register M.D. On: 07/18/2021 05:51   Ankle Imaging: Ankle-R DG Complete: No results found for this or any previous visit.  Ankle-L DG Complete: No results found for this or any previous visit.   Foot Imaging: Foot-R DG Complete: No results found for this or any previous visit.  Foot-L DG Complete: No results found for this or any previous visit.   Elbow Imaging: Elbow-R DG Complete: No results found for this or any previous visit.  Elbow-L DG Complete: No results found for this or any previous visit.   Wrist Imaging: Wrist-R DG Complete:  No results found for this or any previous visit.  Wrist-L DG Complete: No results found for this or any previous visit.   Hand Imaging: Hand-R DG Complete: No results found for this or any previous visit.  Hand-L DG Complete: No results found for this or any previous visit.   Complexity Note: Imaging results reviewed. Results shared with Mark Austin, using Layman's terms.                         Meds   Current Outpatient Medications:    anastrozole (ARIMIDEX) 1 MG tablet, Take 1 mg by mouth once a week., Disp: , Rfl:    aspirin 81 MG EC tablet, Take 81 mg by mouth daily., Disp: , Rfl:    carisoprodol (SOMA) 250 MG tablet, Take 250 mg by mouth 3 (three) times daily as needed., Disp: , Rfl:    cyclobenzaprine (FLEXERIL) 10 MG tablet, Take 1 tablet (10  mg total) by mouth 3 (three) times daily as needed., Disp: 90 tablet, Rfl: 0   dicyclomine (BENTYL) 10 MG capsule, Take 1 capsule (10 mg total) by mouth 3 (three) times daily as needed for up to 14 days for spasms. or abdominal pain, Disp: 30 capsule, Rfl: 0   ezetimibe (ZETIA) 10 MG tablet, Take 10 mg by mouth daily., Disp: , Rfl:    Famotidine (ZANTAC 360 PO), Take 1 tablet by mouth as needed., Disp: , Rfl:    LATUDA 20 MG TABS tablet, Take 20 mg by mouth daily., Disp: , Rfl:    lisinopril (ZESTRIL) 40 MG tablet, TAKE 1 TABLET(40 MG) BY MOUTH IN THE MORNING AND AT BEDTIME, Disp: 60 tablet, Rfl: 5   LORazepam (ATIVAN) 1 MG tablet, Take 1 mg by mouth 2 (two) times daily., Disp: , Rfl:    methylphenidate (RITALIN LA) 30 MG 24 hr capsule, Take 30 mg by mouth every morning. (Patient not taking: Reported on 07/11/2021), Disp: , Rfl:    methylphenidate (RITALIN) 20 MG tablet, Take 20 mg by mouth daily. (Patient not taking: Reported on 07/11/2021), Disp: , Rfl:    morphine (MSIR) 15 MG tablet, Take 15 mg by mouth in the morning and at bedtime. (Patient not taking: Reported on 07/11/2021), Disp: , Rfl:    ondansetron (ZOFRAN ODT) 4 MG disintegrating  tablet, Allow 1-2 tablets to dissolve in your mouth every 8 hours as needed for nausea/vomiting, Disp: 30 tablet, Rfl: 0   prazosin (MINIPRESS) 1 MG capsule, TAKE 1 CAPSULE(1 MG) BY MOUTH TWICE DAILY, Disp: 60 capsule, Rfl: 5   propranolol ER (INDERAL LA) 160 MG SR capsule, TAKE 1 CAPSULE(160 MG) BY MOUTH DAILY, Disp: 30 capsule, Rfl: 5   senna-docusate (SENOKOT-S) 8.6-50 MG tablet, Take 2 tablets by mouth as needed., Disp: 60 tablet, Rfl: 0   spironolactone (ALDACTONE) 25 MG tablet, Take 25 mg by mouth 2 (two) times daily., Disp: , Rfl:    tadalafil (CIALIS) 20 MG tablet, Take 20 mg by mouth daily as needed for erectile dysfunction., Disp: , Rfl:    tapentadol (NUCYNTA) 100 MG 12 hr tablet, Take 100 mg by mouth in the morning and at bedtime.  (Patient not taking: Reported on 07/11/2021), Disp: , Rfl:    testosterone cypionate (DEPOTESTOTERONE CYPIONATE) 100 MG/ML injection, Inject 200 mg into the muscle every 7 (seven) days. For IM use only  Thursday's, Disp: , Rfl:    trimethoprim-polymyxin b (POLYTRIM) ophthalmic solution, Place 1 drop into both eyes every 4 (four) hours. 1 drop into both eyes every 4 hours while awake x7 days., Disp: 10 mL, Rfl: 0   varenicline (CHANTIX) 1 MG tablet, Take 1 mg by mouth 2 (two) times daily., Disp: , Rfl:    Vitamin D, Ergocalciferol, (DRISDOL) 1.25 MG (50000 UNIT) CAPS capsule, Take 1 capsule (50,000 Units total) by mouth every 7 (seven) days. Take for 8 total doses(weeks), Disp: 8 capsule, Rfl: 0  ROS  Constitutional: Denies any fever or chills Gastrointestinal: No reported hemesis, hematochezia, vomiting, or acute GI distress Musculoskeletal: Denies any acute onset joint swelling, redness, loss of ROM, or weakness Neurological: No reported episodes of acute onset apraxia, aphasia, dysarthria, agnosia, amnesia, paralysis, loss of coordination, or loss of consciousness  Allergies  Mark Austin is allergic to ketorolac tromethamine, lidocaine, tramadol,  atorvastatin, brassica oleracea, carisoprodol, carvedilol, gabapentin, and rosuvastatin.  PFSH  Drug: Mark Austin  reports current drug use. Frequency: 2.00 times per week. Drug: Other-see comments. Alcohol:  reports  that he does not currently use alcohol. Tobacco:  reports that he has quit smoking. He has quit using smokeless tobacco. Medical:  has a past medical history of Anxiety, Back ache, Depression, Diverticulitis, GERD (gastroesophageal reflux disease), History of kidney stones, Hypertension, Inguinal hernia, PTSD (post-traumatic stress disorder), and Seizure (HCC). Surgical: Mark Austin  has a past surgical history that includes back surgeries; Back surgery; Bowel resection (2016); Inguinal hernia repair (Left); Shoulder surgery (Right); Cholecystectomy (2017); XI Robotic assisted inguinal hernia repair with mesh (Right, 10/08/2019); Hernia repair; Colon surgery; Colonoscopy; Excision mass lower extremeties (Left, 11/30/2019); Colonoscopy with propofol (N/A, 03/19/2021); and polypectomy (N/A, 03/19/2021). Family: family history includes Healthy in his father and mother.  Constitutional Exam  General appearance: Well nourished, well developed, and well hydrated. In no apparent acute distress There were no vitals filed for this visit. BMI Assessment: Estimated body mass index is 29.29 kg/m as calculated from the following:   Height as of 09/12/21: 5\' 11"  (1.803 m).   Weight as of 09/12/21: 210 lb (95.3 kg).  BMI interpretation table: BMI level Category Range association with higher incidence of chronic pain  <18 kg/m2 Underweight   18.5-24.9 kg/m2 Ideal body weight   25-29.9 kg/m2 Overweight Increased incidence by 20%  30-34.9 kg/m2 Obese (Class I) Increased incidence by 68%  35-39.9 kg/m2 Severe obesity (Class II) Increased incidence by 136%  >40 kg/m2 Extreme obesity (Class III) Increased incidence by 254%   Patient's current BMI Ideal Body weight  There is no height or weight on file to  calculate BMI. Ideal body weight: 75.3 kg (166 lb 0.1 oz) Adjusted ideal body weight: 83.3 kg (183 lb 9.7 oz)   BMI Readings from Last 4 Encounters:  09/12/21 29.29 kg/m  07/24/21 32.14 kg/m  07/11/21 32.28 kg/m  05/29/21 31.14 kg/m   Wt Readings from Last 4 Encounters:  09/12/21 210 lb (95.3 kg)  07/24/21 224 lb (101.6 kg)  07/11/21 225 lb (102.1 kg)  05/29/21 217 lb (98.4 kg)    Psych/Mental status: Alert, oriented x 3 (person, place, & time)       Eyes: PERLA Respiratory: No evidence of acute respiratory distress  Assessment & Plan  Primary Diagnosis & Pertinent Problem List: There were no encounter diagnoses.  Visit Diagnosis: No diagnosis found. Problems updated and reviewed during this visit: No problems updated.  Plan of Care  Pharmacotherapy (Medications Ordered): No orders of the defined types were placed in this encounter.  Procedure Orders    No procedure(s) ordered today   Lab Orders  No laboratory test(s) ordered today   Imaging Orders  No imaging studies ordered today   Referral Orders  No referral(s) requested today    Pharmacological management options:  Opioid Analgesics: I will not be prescribing any opioids at this time Membrane stabilizer: I will not be prescribing any at this time Muscle relaxant: I will not be prescribing any at this time NSAID: I will not be prescribing any at this time Other analgesic(s): I will not be prescribing any at this time      Interventional Therapies  Risk  Complexity Considerations:   Estimated body mass index is 29.29 kg/m as calculated from the following:   Height as of this encounter: 5\' 11"  (1.803 m).   Weight as of this encounter: 210 lb (95.3 kg). WNL   Planned  Pending:   Pending further evaluation   Under consideration:   Since the patient experienced increase in the pain with flexion and extension movements  of the lumbar spine, we will go ahead and order an x-ray of the lumbar spine  with flexion-extension views to determine if there is any type of instability. Diagnostic bilateral L4 TFESI #1  Diagnostic right L5 TFESI #1  Diagnostic midline caudal ESI + diagnostic epidurogram #1  Diagnostic x-rays of the hip  Diagnostic bilateral IA hip joint injection #1  Diagnostic bilateral lumbar facet block #1    Completed:   None at this time   Therapeutic  Palliative (PRN) options:   None established     Provider-requested follow-up: No follow-ups on file. Recent Visits Date Type Provider Dept  09/12/21 Office Visit Delano Metz, MD [facility]  Showing recent visits within past 90 days and meeting all other requirements Future Appointments Date Type Provider Dept  09/26/21 Appointment Delano Metz, MD [facility]  Showing future appointments within next 90 days and meeting all other requirements  Primary Care Physician: Jerrol Banana, MD Note by: Oswaldo Done, MD Date: 09/26/2021; Time: 9:05 AM

## 2021-09-26 ENCOUNTER — Ambulatory Visit: Payer: Managed Care, Other (non HMO) | Admitting: Pain Medicine

## 2021-10-01 ENCOUNTER — Other Ambulatory Visit: Payer: Self-pay

## 2021-10-01 ENCOUNTER — Ambulatory Visit (INDEPENDENT_AMBULATORY_CARE_PROVIDER_SITE_OTHER): Payer: Managed Care, Other (non HMO) | Admitting: Family Medicine

## 2021-10-01 ENCOUNTER — Encounter: Payer: Self-pay | Admitting: Family Medicine

## 2021-10-01 VITALS — BP 146/98 | HR 68 | Ht 71.0 in | Wt 229.0 lb

## 2021-10-01 DIAGNOSIS — I10 Essential (primary) hypertension: Secondary | ICD-10-CM

## 2021-10-01 DIAGNOSIS — M79641 Pain in right hand: Secondary | ICD-10-CM | POA: Diagnosis not present

## 2021-10-01 DIAGNOSIS — F4310 Post-traumatic stress disorder, unspecified: Secondary | ICD-10-CM

## 2021-10-01 DIAGNOSIS — R825 Elevated urine levels of drugs, medicaments and biological substances: Secondary | ICD-10-CM

## 2021-10-01 NOTE — Progress Notes (Signed)
?  ? ?Primary Care / Sports Medicine Office Visit ? ?Patient Information:  ?Patient ID: Mark Austin, male DOB: 1980-08-04 Age: 41 y.o. MRN: [id number]  ? ?Mark Austin is a pleasant 41 y.o. male presenting with the following: ? ?Chief Complaint  ?Patient presents with  ? Lumbar radiculopathy  ? Adrenal Concern  ? ? ?Vitals:  ? 10/01/21 0856  ?BP: (!) 146/98  ?Pulse: 68  ?SpO2: 97%  ? ?Vitals:  ? 10/01/21 0856  ?Weight: 229 lb (103.9 kg)  ?Height: '5\' 11"'$  (1.803 m)  ? ?Body mass index is 31.94 kg/m?. ? ?DG Lumbar Spine Complete W/Bend ? ?Result Date: 09/12/2021 ?CLINICAL DATA:  Low back pain EXAM: LUMBAR SPINE - COMPLETE WITH BENDING VIEWS COMPARISON:  Lumbar spine MRI dated September 03, 2021. FINDINGS: There is no evidence of lumbar spine fracture. Alignment is normal. 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 Spinous process fusion at L4-L5. IMPRESSION: 1.  No acute fracture or subluxation. 2. 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 Spinous process fusion at L4-L5. Electronically Signed   By: Keane Police D.O.   On: 09/12/2021 17:38  ? ?MR LUMBAR SPINE WO CONTRAST ? ?Result Date: 09/03/2021 ?CLINICAL DATA:  Acute low back pain with bilateral leg numbness, difficulty walking, and urinary incontinence. EXAM: MRI LUMBAR SPINE WITHOUT CONTRAST TECHNIQUE: Multiplanar, multisequence MR imaging of the lumbar spine was performed. No intravenous contrast was administered. COMPARISON:  Lumbar spine x-rays dated July 17, 2021. MRI lumbar spine dated August 03, 2020. FINDINGS: Segmentation:  Standard. Alignment:  Physiologic. Vertebrae: No fracture, evidence of discitis, or bone lesion. Unchanged interspinous spacer at L4-L5. Conus medullaris and cauda equina: Conus extends to the L1 level. Conus and cauda equina appear normal. Paraspinal and other soft tissues: Negative. Disc levels: T12-L1 to L3-L4:  Negative. L4-L5: Prior posterior decompression and interspinous spacer placement. Unchanged small shallow  central disc protrusion with annular fissure. Unchanged mild bilateral facet arthropathy. Unchanged moderate left and mild right neuroforaminal stenosis. No spinal canal stenosis. L5-S1: Prior right hemilaminectomy. Unchanged mild disc bulging with superimposed small right paracentral disc protrusion and annular fissure encroaching on the descending right S1 nerve root. Unchanged mild right lateral recess stenosis. No spinal canal or neuroforaminal stenosis. IMPRESSION: 1. 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 2. 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 Electronically Signed   By: Titus Dubin M.D.   On: 09/03/2021 13:48  ? ?DG HIP UNILAT W OR W/O PELVIS 2-3 VIEWS LEFT ? ?Result Date: 09/12/2021 ?CLINICAL DATA:  Hip pain EXAM: DG HIP (WITH OR WITHOUT PELVIS) 2-3V LEFT COMPARISON:  None. FINDINGS: There is no evidence of hip fracture or dislocation. There is no evidence of arthropathy or other focal bone abnormality. Rounded hyperdensity in the proximal left femoral neck, likely a bone island. Spinous process fusion at L4-L5. IMPRESSION: Negative. Electronically Signed   By: Keane Police D.O.   On: 09/12/2021 17:35  ? ?DG HIP UNILAT W OR W/O PELVIS 2-3 VIEWS RIGHT ? ?Result Date: 09/12/2021 ?CLINICAL DATA:  Right hip pain arthralgia. EXAM: DG HIP (WITH OR WITHOUT PELVIS) 2-3V RIGHT COMPARISON:  None. FINDINGS: There is no evidence of hip fracture or dislocation. There is no evidence of arthropathy or other focal bone abnormality. L4-L5 spinous process fusion. IMPRESSION: No evidence of fracture, dislocation or arthropathy of the right hip. Electronically Signed   By: Wyatt Mage  Ahmed D.O.   On: 09/12/2021 17:33    ? ?Independent interpretation of notes and tests performed by another provider:  ? ?None ? ?Procedures performed:  ? ?None ? ?Pertinent  History, Exam, Impression, and Recommendations:  ? ?Hypertension ?Chronic condition, actively followed by cardiology, per EMR review, concern for pheochromocytoma based on urine catecholamines, fortunately recent imaging of the adrenals has been reassuring. ? 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 ? ?I reviewed the interplay of the above, next steps, at this stage further evaluation endocrinology advised and maintain follow-up with cardiology for tighter BP management recommended. ? ?PTSD (post-traumatic stress disorder) ?Chronic condition, actively followed by psychiatry, I have advised further follow-up for tighter as needed anxiolytic Rx management.  We will follow peripherally on this issue. ? ?Elevated urine levels of catecholamines ?Noted during cardiology work-up for recalcitrant hypertension, fortunately adrenal imaging reassuring.  A referral to endocrinology has been placed for further evaluation of his elevated urine catecholamines ? ?Right hand pain ?Acute on chronic issue, does give a history of prior fracture to the right fifth metacarpal.  He is right-hand-dominant.  Noted pain several days prior while pulling back on resistance, had acute onset of pain to the volar hand ulnar aspect. Examination shows tenderness along the 5th > 4th volar MC region, pain with resisted flexion at fingers and wrist, no specific crepitus. Concern for aggravation of old osseous injury given history of fracture, acute onset. I have advised dedicated x-rays, cockup wrist brace, and we will touch base after x-rays for next steps and duration of brace usage.  ? ?I provided a total time of 47 minutes including both face-to-face and non-face-to-face time on 10/01/2021 inclusive of time utilized for medical chart review, information gathering, care coordination with staff, and  documentation completion.  Specifically, we discussed the interplay between his various medical conditions inclusive of pain stemming from lumbar spine in the setting of concern for surgical hardware issue, interim nonsurgical measures, need for spine surgeon follow-up, elevated blood pressure readings in the setting of elevated urine catecholamines, reassuring imaging, need for further evaluation by endocrinology, and interim anxiety control measures via psychiatry. ? ?Orders & Medications ?No orders of the defined types were placed in this encounter. ? ?Orders Placed This Encounter  ?Procedures  ? DG Hand Complete Right  ? Ambulatory referral to Endocrinology  ?  ? ?Return if symptoms worsen or fail to improve.  ?  ? ?[name], MD ? ? Primary Care Sports Medicine ?[facility] ?[place]  ? ?

## 2021-10-01 NOTE — Assessment & Plan Note (Signed)
Noted during cardiology work-up for recalcitrant hypertension, fortunately adrenal imaging reassuring.  A referral to endocrinology has been placed for further evaluation of his elevated urine catecholamines ?

## 2021-10-01 NOTE — Assessment & Plan Note (Addendum)
Acute on chronic issue, does give a history of prior fracture to the right fifth metacarpal.  He is right-hand-dominant.  Noted pain several days prior while pulling back on resistance, had acute onset of pain to the volar hand ulnar aspect. Examination shows tenderness along the 5th > 4th volar MC region, pain with resisted flexion at fingers and wrist, no specific crepitus. Concern for aggravation of old osseous injury given history of fracture, acute onset. I have advised dedicated x-rays, cockup wrist brace, and we will touch base after x-rays for next steps and duration of brace usage. ?

## 2021-10-01 NOTE — Patient Instructions (Signed)
-   Use wrist brace at all times, okay to remove for bathing/handwashing/sleeping ?- Obtain x-rays downstairs today (Remove brace for x-rays) ?- Touch base with psychiatry for as needed anxiety management medication adjustment ?- Referral coordinator will contact in regards to endocrinology scheduling ?- Maintain follow-ups with neurosurgery, pain and spine, and cardiology ?- Contact for any questions and follow-up as needed ?

## 2021-10-01 NOTE — Assessment & Plan Note (Signed)
Chronic condition, actively followed by cardiology, per EMR review, concern for pheochromocytoma based on urine catecholamines, fortunately recent imaging of the adrenals has been reassuring. ? 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 ? ?I reviewed the interplay of the above, next steps, at this stage further evaluation endocrinology advised and maintain follow-up with cardiology for tighter BP management recommended. ?

## 2021-10-01 NOTE — Assessment & Plan Note (Signed)
Chronic condition, actively followed by psychiatry, I have advised further follow-up for tighter as needed anxiolytic Rx management.  We will follow peripherally on this issue. ?

## 2021-10-30 ENCOUNTER — Encounter: Payer: Self-pay | Admitting: Pain Medicine

## 2021-10-30 DIAGNOSIS — R700 Elevated erythrocyte sedimentation rate: Secondary | ICD-10-CM | POA: Insufficient documentation

## 2021-10-30 DIAGNOSIS — F1290 Cannabis use, unspecified, uncomplicated: Secondary | ICD-10-CM | POA: Insufficient documentation

## 2021-10-30 DIAGNOSIS — R892 Abnormal level of other drugs, medicaments and biological substances in specimens from other organs, systems and tissues: Secondary | ICD-10-CM | POA: Insufficient documentation

## 2021-11-05 ENCOUNTER — Ambulatory Visit: Payer: Managed Care, Other (non HMO) | Admitting: Pain Medicine

## 2021-11-05 NOTE — Progress Notes (Deleted)
No show to appointment.

## 2021-12-31 ENCOUNTER — Encounter: Payer: Self-pay | Admitting: Emergency Medicine

## 2021-12-31 ENCOUNTER — Ambulatory Visit: Payer: Self-pay

## 2021-12-31 ENCOUNTER — Other Ambulatory Visit: Payer: Self-pay

## 2021-12-31 ENCOUNTER — Emergency Department: Payer: Managed Care, Other (non HMO)

## 2021-12-31 ENCOUNTER — Emergency Department
Admission: EM | Admit: 2021-12-31 | Discharge: 2021-12-31 | Disposition: A | Discharge status: Home / Self Care | Payer: Managed Care, Other (non HMO) | Attending: Emergency Medicine | Admitting: Emergency Medicine

## 2021-12-31 DIAGNOSIS — S59911A Unspecified injury of right forearm, initial encounter: Secondary | ICD-10-CM | POA: Diagnosis present

## 2021-12-31 DIAGNOSIS — S51831A Puncture wound without foreign body of right forearm, initial encounter: Secondary | ICD-10-CM | POA: Diagnosis not present

## 2021-12-31 DIAGNOSIS — M79631 Pain in right forearm: Secondary | ICD-10-CM | POA: Insufficient documentation

## 2021-12-31 DIAGNOSIS — W540XXA Bitten by dog, initial encounter: Secondary | ICD-10-CM | POA: Insufficient documentation

## 2021-12-31 DIAGNOSIS — I10 Essential (primary) hypertension: Secondary | ICD-10-CM | POA: Diagnosis not present

## 2021-12-31 DIAGNOSIS — S50312A Abrasion of left elbow, initial encounter: Secondary | ICD-10-CM | POA: Insufficient documentation

## 2021-12-31 DIAGNOSIS — S80812A Abrasion, left lower leg, initial encounter: Secondary | ICD-10-CM | POA: Diagnosis not present

## 2021-12-31 IMAGING — CR DG FOREARM 2V*R*
2 series · 2 of 2 positions shown · non-contrast
Comparison: None Available.

CLINICAL DATA: Bitten by dog yesterday. Multiple puncture wounds by
right wrist area.

EXAM:
RIGHT FOREARM - 2 VIEW

[forearm ap]
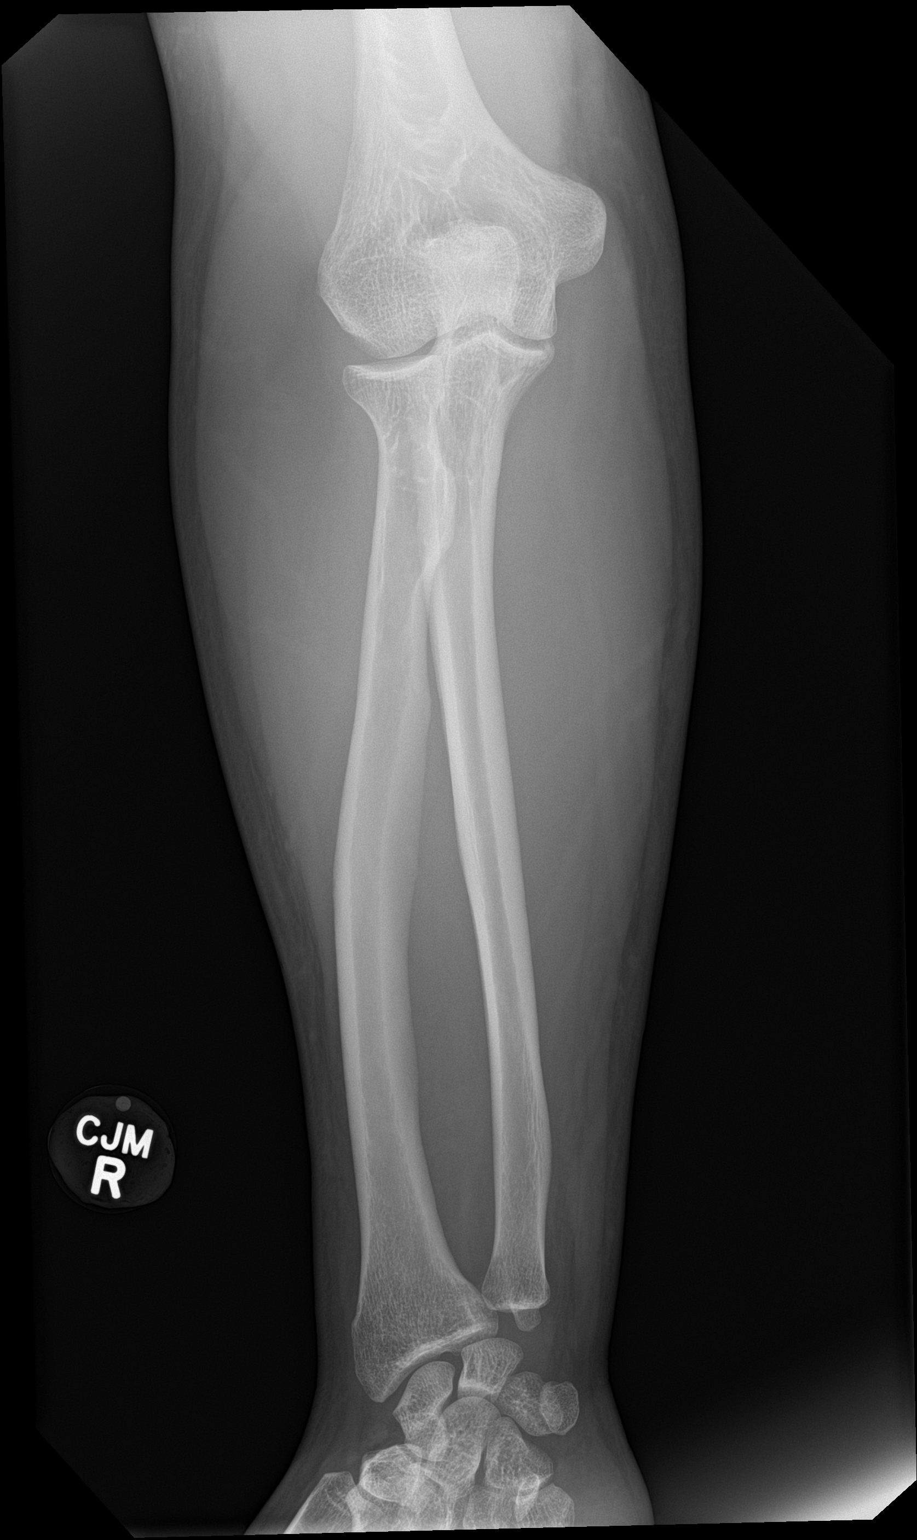

[forearm lat]
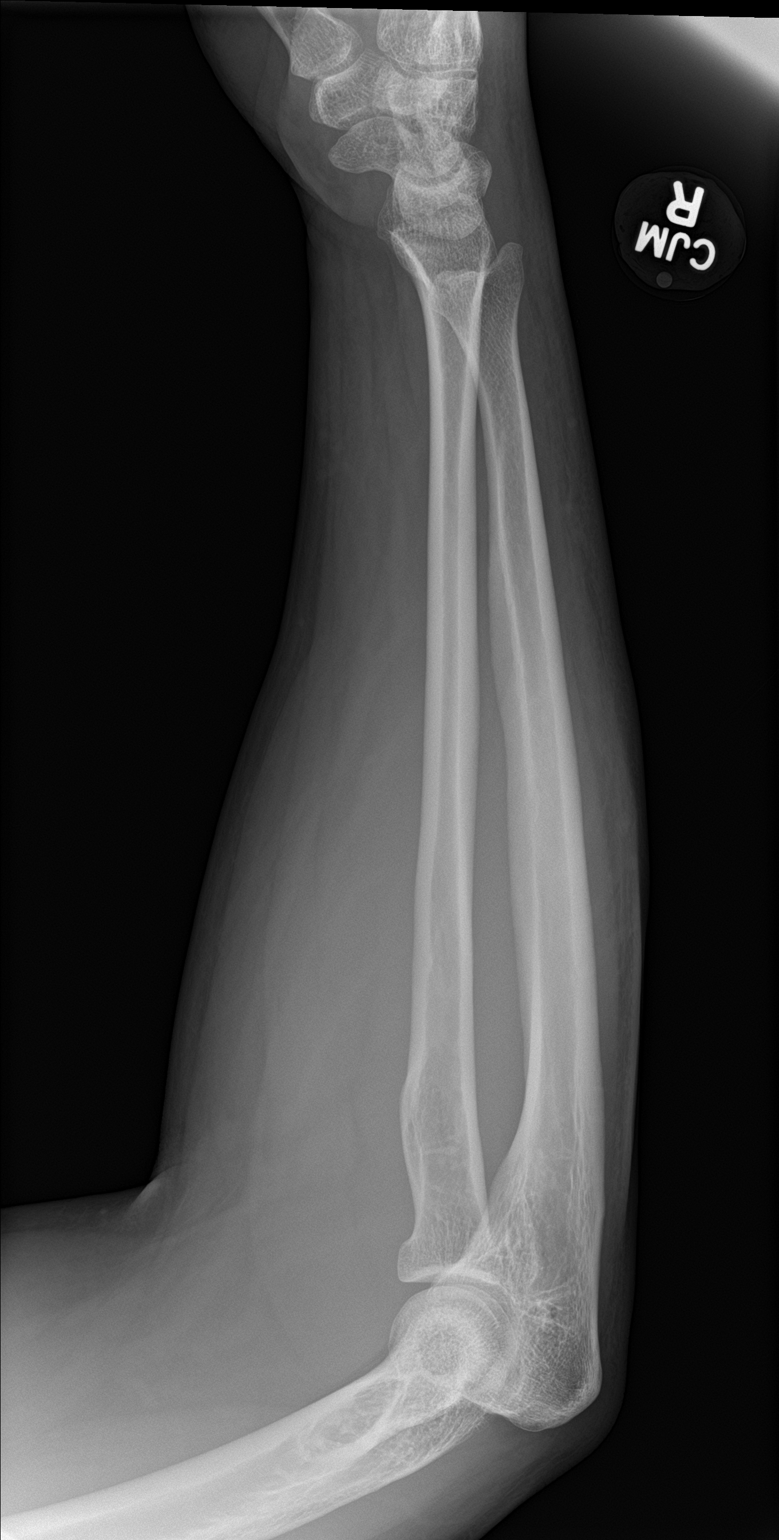

[2 of 2 positions shown; findings below may reference images not displayed]

FINDINGS: Normal bone mineralization. No elbow joint effusion. No acute
fracture is seen. No dislocation. No radiopaque foreign body.
IMPRESSION: No radiopaque foreign body.  No acute fracture.

## 2021-12-31 NOTE — ED Triage Notes (Signed)
Pt via POV from home. Pt here for an animal bite yesterday. Pt was seen by his PCP and they started him on antibiotic and gave him neosporin. Pt c/o pain in R pinky from it. Unknown is the dog is vaccinated, but it is someone's pet. Pt has bite marks to the R forearm. Scratches to the L elbow. Pt is A&Ox4 and NAD

## 2021-12-31 NOTE — ED Notes (Signed)
Tried to get info about were dog bite occurred so I could report it to the proper authorities. Pt said bite occurred at mom's house. Pt unsure of mom's address, city or county where mom lives. Per pt, mom is taking dog back to humane society today. PA notified.

## 2021-12-31 NOTE — Discharge Instructions (Addendum)
Follow-up with your primary care provider if any continued problems or concerns.  Clean the area daily with mild soap and water or twice a day if you can do this.  Continue taking the Augmentin until completely finished.  Wear the wrist splint to prevent excessive bending of your wrist and also for protection.  Return to the emergency department if any severe worsening of your symptoms such as high fever or inability to move fingers or excessive swelling.

## 2021-12-31 NOTE — Telephone Encounter (Signed)
FYI:  PC to pt, discussed going to ER to start the Rabies and have wound checked out with the swelling and redness. Pt also advised he will need to continue the Vaccine series and will be in [place] during most of that time, advised to still receive the vaccine in [place]. Pt voiced understanding.

## 2021-12-31 NOTE — ED Provider Triage Note (Signed)
Emergency Medicine Provider Triage Evaluation Note  Mark Austin , a 41 y.o. male  was evaluated in triage.  Pt complains of dog bite yesterday.  Dog was his mother's dog.  Has not notified animal control.  Was seen at urgent care and started on amoxicillin yesterday.  Complaining of pain to the right forearm.  Review of Systems  Positive: Right forearm pain Negative: Fever chills  Physical Exam  BP (!) 151/99 (BP Location: Left Arm)   Pulse 75   Temp 97.9 F (36.6 C) (Oral)   Resp 20   Ht '5\' 10"'$  (1.778 m)   Wt 102.1 kg   SpO2 95%   BMI 32.28 kg/m  Gen:   Awake, no distress   Resp:  Normal effort  MSK:   Moves extremities without difficulty  Other:  Bite mark noted on the right forearm, no pus or swelling  Medical Decision Making  Medically screening exam initiated at 11:36 AM.  Appropriate orders placed.  Mark Austin was informed that the remainder of the evaluation will be completed by another provider, this initial triage assessment does not replace that evaluation, and the importance of remaining in the ED until their evaluation is complete.  X-ray of the right forearm.  Did explain to the patient that he needs to notify animal control as they will hold the dog bite hold and we will not start rabies vaccinations until or if the dog show signs of rabies   Mark Starks, PA-C 12/31/21 1137

## 2021-12-31 NOTE — ED Provider Notes (Signed)
[facility] Provider Note    Event Date/Time   First MD Initiated Contact with Patient 12/31/21 1244     (approximate)   History   Animal Bite   HPI  Mark Austin is a 41 y.o. male   presents to the ED with multiple dog bites and scratches from a 23-year-old great Dane that belongs to his mother.  Patient was seen at an urgent care where he was started on Augmentin and patient is currently up-to-date on immunizations.  Patient is not 100% sure if mother knows the status of immunizations however she did get it from [facility] [employer] and has been there for their spay and neuter clinic.  Patient has multiple bites and scratches.  He reports that mother is taking the dog back to the [employer].  Patient has a history of GERD, kidney stones, hypertension, PTSD, seizures, diverticulitis and anxiety.      Physical Exam   Triage Vital Signs: ED Triage Vitals  Enc Vitals Group     BP 12/31/21 1132 (!) 151/99     Pulse Rate 12/31/21 1132 75     Resp 12/31/21 1132 20     Temp 12/31/21 1132 97.9 F (36.6 C)     Temp Source 12/31/21 1132 Oral     SpO2 12/31/21 1132 95 %     Weight 12/31/21 1133 225 lb (102.1 kg)     Height 12/31/21 1133 '5\' 10"'$  (1.778 m)     Head Circumference --      Peak Flow --      Pain Score 12/31/21 1133 7     Pain Loc --      Pain Edu? --      Excl. in [place]? --     Most recent vital signs: Vitals:   12/31/21 1132  BP: (!) 151/99  Pulse: 75  Resp: 20  Temp: 97.9 F (36.6 C)  SpO2: 95%     General: Awake, no distress.  CV:  Good peripheral perfusion.  Heart regular rate rhythm. Resp:  Normal effort.  Lungs clear bilaterally. Abd:  No distention.  Other:  On examination of the right forearm there are multiple puncture wounds consistent with a dog bite.  No drainage or bleeding noted from the sites.  Patient is able to flex and extend all digits without any difficulty.  Motor or sensory function intact.  Radial pulse present.   There are superficial linear abrasions noted to the left elbow and also to the left lower extremity.   ED Results / Procedures / Treatments   Labs (all labs ordered are listed, but only abnormal results are displayed) Labs Reviewed - No data to display    RADIOLOGY  Right forearm x-ray images were reviewed and interpreted by myself independent of the radiologist and no opaque foreign bodies were noted.  Radiology report agrees and also mentions no fracture.   PROCEDURES:  Critical Care performed:   Procedures   MEDICATIONS ORDERED IN ED: Medications - No data to display   IMPRESSION / MDM / [place] / ED COURSE  I reviewed the triage vital signs and the nursing notes.   Differential diagnosis includes, but is not limited to, multiple dog bites, infected dog bites, foreign body right forearm due to dog bite, rule out rabies exposure.  41 year old male presents to the ED with multiple dog bites that occurred 2 days ago at his mother's house from a 68-year-old great Dane.  Patient states that  the dog belongs to the mother and was obtained through the [employer] and also taken back for their spay and neuter clinic.  Patient states that he is up-to-date on tetanus.  Is felt that since the dog came from the [employer] and also was in the clinic proof of immunizations would had to be provided at that time.  Patient states that mother is taking the dog back to the [employer] today.  He is currently on Augmentin for infection prophylaxis.  X-ray done today in the ED was negative for any opaque foreign bodies or fractures.  Patient was placed in a cock-up wrist splint.  He is strongly encouraged to continue watching these areas for any signs of infection and clean daily with mild soap and water and to take antibiotics until completely finished.  He is return to the emergency department or follow-up with his PCP if any signs of infection such as drainage or fever.       Patient's presentation is most consistent with acute complicated illness / injury requiring diagnostic workup.  FINAL CLINICAL IMPRESSION(S) / ED DIAGNOSES   Final diagnoses:  Dog bite, initial encounter     Rx / DC Orders   ED Discharge Orders     None        Note:  This document was prepared using Dragon voice recognition software and may include unintentional dictation errors.   Johnn Hai, PA-C 12/31/21 1354    Lavonia Drafts, MD 12/31/21 [phone]

## 2021-12-31 NOTE — Telephone Encounter (Signed)
      Chief Complaint: Bitten by 2 dogs, punctures to both arms and and left thigh. Unsure if dogs are vaccinated. Symptoms: Pain, swelling at sites. Seen in UC. Frequency: Saturday Pertinent Negatives: Patient denies fever Disposition: '[]'$ ED /'[]'$ Urgent Care (no appt availability in office) / '[]'$ Appointment(In office/virtual)/ '[]'$  [place] [facility]/ '[]'$ Home Care/ '[]'$ Refused Recommended Disposition /'[]'$ [place] [facility]/ '[x]'$  Follow-up with PCP Additional Notes: Protocol indicates go to ED, Estill Bamberg in practice will have PCP give disposition.  Answer Assessment - Initial Assessment Questions 1. ANIMAL: "What type of animal caused the bite?" "Is the injury from a bite or a claw?" If the animal is a dog or a cat, ask: "Was it a pet or a stray?" "Was it acting ill or behaving strangely?"     Dogs - 2 2. LOCATION: "Where is the bite located?"      Both arms , left thigh 3. SIZE: "How big is the bite?" "What does it look like?"      Puncture 4. ONSET: "When did the bite happen?" (Minutes or hours ago)      Saturday 5. CIRCUMSTANCES: "Tell me how this happened."      Visiting someone 6. TETANUS: "When was the last tetanus booster?"     Unsure 7. PREGNANCY: "Is there any chance you are pregnant?" "When was your last menstrual period?"     N/a  Protocols used: Animal Bite-A-AH

## 2022-02-02 ENCOUNTER — Emergency Department: Payer: Managed Care, Other (non HMO)

## 2022-02-02 ENCOUNTER — Encounter: Payer: Self-pay | Admitting: Emergency Medicine

## 2022-02-02 ENCOUNTER — Emergency Department
Admission: EM | Admit: 2022-02-02 | Discharge: 2022-02-02 | Disposition: A | Discharge status: Home / Self Care | Payer: Managed Care, Other (non HMO) | Attending: Emergency Medicine | Admitting: Emergency Medicine

## 2022-02-02 ENCOUNTER — Other Ambulatory Visit: Payer: Self-pay

## 2022-02-02 DIAGNOSIS — I10 Essential (primary) hypertension: Secondary | ICD-10-CM | POA: Diagnosis not present

## 2022-02-02 DIAGNOSIS — K529 Noninfective gastroenteritis and colitis, unspecified: Secondary | ICD-10-CM | POA: Diagnosis not present

## 2022-02-02 DIAGNOSIS — R1031 Right lower quadrant pain: Secondary | ICD-10-CM | POA: Diagnosis not present

## 2022-02-02 DIAGNOSIS — R112 Nausea with vomiting, unspecified: Secondary | ICD-10-CM | POA: Diagnosis present

## 2022-02-02 LAB — URINALYSIS, ROUTINE W REFLEX MICROSCOPIC
Bilirubin Urine: NEGATIVE
Glucose, UA: NEGATIVE mg/dL
Hgb urine dipstick: NEGATIVE
Ketones, ur: NEGATIVE mg/dL
Leukocytes,Ua: NEGATIVE
Nitrite: NEGATIVE
Protein, ur: NEGATIVE mg/dL
Specific Gravity, Urine: 1.023 (ref 1.005–1.030)
pH: 5 (ref 5.0–8.0)

## 2022-02-02 LAB — COMPREHENSIVE METABOLIC PANEL
ALT: 20 U/L (ref 0–44)
AST: 24 U/L (ref 15–41)
Albumin: 4.6 g/dL (ref 3.5–5.0)
Alkaline Phosphatase: 52 U/L (ref 38–126)
Anion gap: 9 (ref 5–15)
BUN: 12 mg/dL (ref 6–20)
CO2: 25 mmol/L (ref 22–32)
Calcium: 9.9 mg/dL (ref 8.9–10.3)
Chloride: 102 mmol/L (ref 98–111)
Creatinine, Ser: 0.89 mg/dL (ref 0.61–1.24)
GFR, Estimated: >60 mL/min (ref >60)
Glucose, Bld: 106 mg/dL — ABNORMAL HIGH (ref 70–99)
Potassium: 4.5 mmol/L (ref 3.5–5.1)
Sodium: 136 mmol/L (ref 135–145)
Total Bilirubin: 0.8 mg/dL (ref 0.3–1.2)
Total Protein: 8.4 g/dL — ABNORMAL HIGH (ref 6.5–8.1)

## 2022-02-02 LAB — CBC
HCT: 55.1 % — ABNORMAL HIGH (ref 39.0–52.0)
Hemoglobin: 18.5 g/dL — ABNORMAL HIGH (ref 13.0–17.0)
MCH: 30.3 pg (ref 26.0–34.0)
MCHC: 33.6 g/dL (ref 30.0–36.0)
MCV: 90.3 fL (ref 80.0–100.0)
Platelets: 227 10*3/uL (ref 150–400)
RBC: 6.1 MIL/uL — ABNORMAL HIGH (ref 4.22–5.81)
RDW: 12.6 % (ref 11.5–15.5)
WBC: 9.7 10*3/uL (ref 4.0–10.5)
nRBC: 0 % (ref 0.0–0.2)

## 2022-02-02 LAB — LIPASE, BLOOD: Lipase: 55 U/L — ABNORMAL HIGH (ref 11–51)

## 2022-02-02 MED ORDER — AMOXICILLIN-POT CLAVULANATE 875-125 MG PO TABS: 1.0000 | ORAL_TABLET | Freq: Two times a day (BID) | ORAL | 0 refills | Status: AC

## 2022-02-02 MED ORDER — ONDANSETRON 8 MG PO TBDP: 8.0000 mg | ORAL_TABLET | Freq: Three times a day (TID) | ORAL | 0 refills | Status: DC | PRN

## 2022-02-02 MED ORDER — SODIUM CHLORIDE 0.9 % IV BOLUS
1000.0000 mL | Freq: Once | INTRAVENOUS | Status: AC
  Administered 2022-02-02: 1000 mL via INTRAVENOUS

## 2022-02-02 MED ORDER — IOHEXOL 300 MG/ML  SOLN
100.0000 mL | Freq: Once | INTRAMUSCULAR | Status: AC | PRN
  Administered 2022-02-02: 100 mL via INTRAVENOUS

## 2022-02-02 MED ORDER — ONDANSETRON HCL 4 MG/2ML IJ SOLN
4.0000 mg | Freq: Once | INTRAMUSCULAR | Status: AC
  Administered 2022-02-02: 4 mg via INTRAVENOUS
  Filled 2022-02-02: qty 2

## 2022-02-02 MED ORDER — HYDROMORPHONE HCL 1 MG/ML IJ SOLN
1.0000 mg | Freq: Once | INTRAMUSCULAR | Status: AC
  Administered 2022-02-02: 1 mg via INTRAVENOUS
  Filled 2022-02-02: qty 1

## 2022-02-02 MED ORDER — OXYCODONE-ACETAMINOPHEN 5-325 MG PO TABS
1.0000 | ORAL_TABLET | Freq: Once | ORAL | Status: AC
  Administered 2022-02-02: 1 via ORAL
  Filled 2022-02-02: qty 1

## 2022-02-02 NOTE — ED Triage Notes (Signed)
Pt reports woke up suddenly this am with abd pain to ruq and rlq. Pt reports pain radiated into groin as well. Pt states some NV with the pain. Reports pain is constant with intermittent increases in severity

## 2022-02-02 NOTE — ED Provider Notes (Signed)
[facility] Provider Note    Event Date/Time   First MD Initiated Contact with Patient 02/02/22 [phone]     (approximate)   History   Abdominal Pain, Emesis, and Nausea   HPI  Mark Austin is a 41 y.o. male with a history of hypertension, OSA, arthritis, migraine, TIA, and diverticulitis who presents with right-sided abdominal pain, acute onset around 3 AM, persistent course, associated initially with nausea and vomiting.  The patient has also had some nonbloody diarrhea.  He denies any urinary symptoms.  He denies any prior history of this pain.  He reports a prior history of partial colectomy about 10 years ago after he had a perforation due to diverticulitis.      Physical Exam   Triage Vital Signs: ED Triage Vitals  Enc Vitals Group     BP 02/02/22 0850 (!) 155/107     Pulse Rate 02/02/22 0850 68     Resp 02/02/22 0850 20     Temp 02/02/22 0850 98.1 F (36.7 C)     Temp Source 02/02/22 0850 Oral     SpO2 02/02/22 0850 96 %     Weight 02/02/22 0835 229 lb 4.5 oz (104 kg)     Height 02/02/22 0835 '5\' 10"'$  (1.778 m)     Head Circumference --      Peak Flow --      Pain Score 02/02/22 0835 9     Pain Loc --      Pain Edu? --      Excl. in [place]? --     Most recent vital signs: Vitals:   02/02/22 1130 02/02/22 1230  BP: (!) 135/96 135/83  Pulse: 61 (!) 52  Resp: (!) 24 13  Temp:    SpO2: 97% 97%    General: Alert and oriented, uncomfortable appearing. CV:  Good peripheral perfusion.  Resp:  Normal effort.  Abd:  Soft with moderate right lower quadrant tenderness. Other:  No scleral icterus.   ED Results / Procedures / Treatments   Labs (all labs ordered are listed, but only abnormal results are displayed) Labs Reviewed  LIPASE, BLOOD - Abnormal; Notable for the following components:      Result Value   Lipase 55 (*)    All other components within normal limits  COMPREHENSIVE METABOLIC PANEL - Abnormal; Notable for the following  components:   Glucose, Bld 106 (*)    Total Protein 8.4 (*)    All other components within normal limits  CBC - Abnormal; Notable for the following components:   RBC 6.10 (*)    Hemoglobin 18.5 (*)    HCT 55.1 (*)    All other components within normal limits  URINALYSIS, ROUTINE W REFLEX MICROSCOPIC - Abnormal; Notable for the following components:   Color, Urine YELLOW (*)    APPearance CLEAR (*)    All other components within normal limits     EKG  ED ECG REPORT I, Arta Silence, the attending physician, personally viewed and interpreted this ECG.  Date: 02/02/2022 EKG Time: 0849 Rate: 69 Rhythm: normal sinus rhythm QRS Axis: normal Intervals: normal ST/T Wave abnormalities: normal Narrative Interpretation: no evidence of acute ischemia    RADIOLOGY  CT abdomen/pelvis: I do believe viewed and interpreted the images no significantly dilated bowel loops, free air, or free fluid.  Radiology report indicates sigmoid colitis with no perforation or other acute findings.  PROCEDURES:  Critical Care performed: No  Procedures   MEDICATIONS ORDERED  IN ED: Medications  ondansetron (ZOFRAN) injection 4 mg (4 mg Intravenous Given 02/02/22 0927)  sodium chloride 0.9 % bolus 1,000 mL (0 mLs Intravenous Stopped 02/02/22 1204)  HYDROmorphone (DILAUDID) injection 1 mg (1 mg Intravenous Given 02/02/22 0927)  iohexol (OMNIPAQUE) 300 MG/ML solution 100 mL (100 mLs Intravenous Contrast Given 02/02/22 1107)  oxyCODONE-acetaminophen (PERCOCET/ROXICET) 5-325 MG per tablet 1 tablet (1 tablet Oral Given 02/02/22 1235)     IMPRESSION / MDM / ASSESSMENT AND PLAN / ED COURSE  I reviewed the triage vital signs and the nursing notes.  41 year old male with PMH as noted above presents with right lower quadrant abdominal pain since early this morning associated with nausea, vomiting, and diarrhea.  I reviewed the past medical records.  The patient most recently had an inguinal hernia repair with  mesh in in 3/21.  He has a history of multiple episodes of diverticulitis and hospital admissions in past years.  Do not have a record of his prior abdominal surgeries.  On exam the vital signs are normal except for hypertension.  The patient does have some right lower quadrant tenderness but no peritoneal signs.  Differential diagnosis includes, but is not limited to, diverticulitis, colitis, appendicitis, ureteral stone, pyelonephritis, bowel obstruction.  Patient's presentation is most consistent with acute presentation with potential threat to life or bodily function.  We will obtain lab work-up, CT abdomen, give analgesia, and reassess.  The patient is on the cardiac monitor to evaluate for evidence of arrhythmia and/or significant heart rate changes.  ----------------------------------------- 1:05 PM on 02/02/2022 -----------------------------------------  CT shows colitis.  There is no leukocytosis on the lab work-up.  Lipase is minimally elevated which appears chronic for the patient.  Electrolytes are normal.  The patient reports significant improved pain.  He is stable for discharge home at this time.  I have prescribed a course of Augmentin.  I gave the patient the return precautions and he expressed understanding.   FINAL CLINICAL IMPRESSION(S) / ED DIAGNOSES   Final diagnoses:  Colitis     Rx / DC Orders   ED Discharge Orders          Ordered    amoxicillin-clavulanate (AUGMENTIN) 875-125 MG tablet  2 times daily        02/02/22 1303    ondansetron (ZOFRAN-ODT) 8 MG disintegrating tablet  Every 8 hours PRN        02/02/22 1303             Note:  This document was prepared using Dragon voice recognition software and may include unintentional dictation errors.    Arta Silence, MD 02/02/22 1306

## 2022-02-02 NOTE — Discharge Instructions (Addendum)
Take the antibiotic as prescribed.  Return to the ER for new, worsening, or persistent severe abdominal pain, fever, vomiting, blood in the stool, weakness, or any other new or worsening symptoms that concern you.

## 2022-03-03 ENCOUNTER — Other Ambulatory Visit: Payer: Self-pay

## 2022-03-03 ENCOUNTER — Encounter: Payer: Self-pay | Admitting: Emergency Medicine

## 2022-03-03 ENCOUNTER — Emergency Department: Payer: Managed Care, Other (non HMO)

## 2022-03-03 ENCOUNTER — Observation Stay
Admission: EM | Admit: 2022-03-03 | Discharge: 2022-03-05 | Disposition: A | Discharge status: Home / Self Care | Payer: Managed Care, Other (non HMO) | Attending: Internal Medicine | Admitting: Internal Medicine

## 2022-03-03 ENCOUNTER — Other Ambulatory Visit: Payer: Self-pay | Admitting: Urology

## 2022-03-03 ENCOUNTER — Observation Stay: Payer: Managed Care, Other (non HMO)

## 2022-03-03 DIAGNOSIS — F329 Major depressive disorder, single episode, unspecified: Secondary | ICD-10-CM | POA: Diagnosis not present

## 2022-03-03 DIAGNOSIS — N5082 Scrotal pain: Secondary | ICD-10-CM | POA: Diagnosis not present

## 2022-03-03 DIAGNOSIS — K219 Gastro-esophageal reflux disease without esophagitis: Secondary | ICD-10-CM | POA: Diagnosis not present

## 2022-03-03 DIAGNOSIS — N50812 Left testicular pain: Secondary | ICD-10-CM | POA: Insufficient documentation

## 2022-03-03 DIAGNOSIS — F4310 Post-traumatic stress disorder, unspecified: Secondary | ICD-10-CM | POA: Insufficient documentation

## 2022-03-03 DIAGNOSIS — G459 Transient cerebral ischemic attack, unspecified: Secondary | ICD-10-CM | POA: Diagnosis present

## 2022-03-03 DIAGNOSIS — I10 Essential (primary) hypertension: Secondary | ICD-10-CM | POA: Diagnosis not present

## 2022-03-03 LAB — CBC
HCT: 58.6 % — ABNORMAL HIGH (ref 39.0–52.0)
Hemoglobin: 19.5 g/dL — ABNORMAL HIGH (ref 13.0–17.0)
MCH: 30.5 pg (ref 26.0–34.0)
MCHC: 33.3 g/dL (ref 30.0–36.0)
MCV: 91.6 fL (ref 80.0–100.0)
Platelets: 234 10*3/uL (ref 150–400)
RBC: 6.4 MIL/uL — ABNORMAL HIGH (ref 4.22–5.81)
RDW: 12.5 % (ref 11.5–15.5)
WBC: 10 10*3/uL (ref 4.0–10.5)
nRBC: 0 % (ref 0.0–0.2)

## 2022-03-03 LAB — BASIC METABOLIC PANEL
Anion gap: 8 (ref 5–15)
BUN: 15 mg/dL (ref 6–20)
CO2: 23 mmol/L (ref 22–32)
Calcium: 10 mg/dL (ref 8.9–10.3)
Chloride: 107 mmol/L (ref 98–111)
Creatinine, Ser: 1.1 mg/dL (ref 0.61–1.24)
GFR, Estimated: >60 mL/min (ref >60)
Glucose, Bld: 113 mg/dL — ABNORMAL HIGH (ref 70–99)
Potassium: 3.6 mmol/L (ref 3.5–5.1)
Sodium: 138 mmol/L (ref 135–145)

## 2022-03-03 LAB — URINALYSIS, ROUTINE W REFLEX MICROSCOPIC
Bilirubin Urine: NEGATIVE
Glucose, UA: NEGATIVE mg/dL
Hgb urine dipstick: NEGATIVE
Ketones, ur: 20 mg/dL — AB
Leukocytes,Ua: NEGATIVE
Nitrite: NEGATIVE
Protein, ur: NEGATIVE mg/dL
Specific Gravity, Urine: 1.029 (ref 1.005–1.030)
pH: 5 (ref 5.0–8.0)

## 2022-03-03 LAB — CK: Total CK: 238 U/L (ref 49–397)

## 2022-03-03 LAB — CHLAMYDIA/NGC RT PCR (ARMC ONLY)
Chlamydia Tr: NOT DETECTED
N gonorrhoeae: NOT DETECTED

## 2022-03-03 LAB — LACTIC ACID, PLASMA: Lactic Acid, Venous: 0.7 mmol/L (ref 0.5–1.9)

## 2022-03-03 MED ORDER — LISINOPRIL 10 MG PO TABS
40.0000 mg | ORAL_TABLET | Freq: Every day | ORAL | Status: DC
  Administered 2022-03-04 – 2022-03-05 (×2): 40 mg via ORAL
  Filled 2022-03-03 (×2): qty 4

## 2022-03-03 MED ORDER — ONDANSETRON HCL 4 MG/2ML IJ SOLN
4.0000 mg | INTRAMUSCULAR | Status: AC
  Administered 2022-03-03: 4 mg via INTRAVENOUS
  Filled 2022-03-03: qty 2

## 2022-03-03 MED ORDER — LURASIDONE HCL 20 MG PO TABS: 20.0000 mg | ORAL_TABLET | Freq: Every day | ORAL | Status: DC

## 2022-03-03 MED ORDER — TESTOSTERONE CYPIONATE 100 MG/ML IM SOLN
200.0000 mg | INTRAMUSCULAR | Status: DC
Start: 2022-03-03 — End: 2022-03-03

## 2022-03-03 MED ORDER — EZETIMIBE 10 MG PO TABS
10.0000 mg | ORAL_TABLET | Freq: Every day | ORAL | Status: DC
  Administered 2022-03-04 – 2022-03-05 (×2): 10 mg via ORAL
  Filled 2022-03-03 (×2): qty 1

## 2022-03-03 MED ORDER — BENZTROPINE MESYLATE 1 MG PO TABS
1.0000 mg | ORAL_TABLET | Freq: Every day | ORAL | Status: DC
  Administered 2022-03-05: 1 mg via ORAL
  Filled 2022-03-03 (×2): qty 1

## 2022-03-03 MED ORDER — MORPHINE SULFATE 15 MG PO TABS
15.0000 mg | ORAL_TABLET | Freq: Every day | ORAL | Status: DC
  Administered 2022-03-03 – 2022-03-04 (×2): 15 mg via ORAL
  Filled 2022-03-03 (×2): qty 1

## 2022-03-03 MED ORDER — TAPENTADOL HCL ER 100 MG PO TB12: 100.0000 mg | ORAL_TABLET | Freq: Two times a day (BID) | ORAL | Status: DC

## 2022-03-03 MED ORDER — ANASTROZOLE 1 MG PO TABS
1.0000 mg | ORAL_TABLET | ORAL | Status: DC
  Administered 2022-03-04: 1 mg via ORAL
  Filled 2022-03-03: qty 1

## 2022-03-03 MED ORDER — ZOLPIDEM TARTRATE 5 MG PO TABS
10.0000 mg | ORAL_TABLET | Freq: Every evening | ORAL | Status: DC | PRN
Start: 2022-03-03 — End: 2022-03-05

## 2022-03-03 MED ORDER — VARENICLINE TARTRATE 0.5 MG PO TABS
1.0000 mg | ORAL_TABLET | Freq: Two times a day (BID) | ORAL | Status: DC
  Filled 2022-03-03 (×4): qty 2

## 2022-03-03 MED ORDER — LORAZEPAM 1 MG PO TABS
1.0000 mg | ORAL_TABLET | Freq: Two times a day (BID) | ORAL | Status: DC
  Administered 2022-03-03 – 2022-03-05 (×4): 1 mg via ORAL
  Filled 2022-03-03 (×4): qty 1

## 2022-03-03 MED ORDER — HYDROMORPHONE HCL 1 MG/ML IJ SOLN: 1.0000 mg | INTRAMUSCULAR | Status: DC | PRN

## 2022-03-03 MED ORDER — ASPIRIN 81 MG PO TBEC
81.0000 mg | DELAYED_RELEASE_TABLET | Freq: Every day | ORAL | Status: DC
  Administered 2022-03-04 – 2022-03-05 (×2): 81 mg via ORAL
  Filled 2022-03-03 (×2): qty 1

## 2022-03-03 MED ORDER — OXYCODONE-ACETAMINOPHEN 5-325 MG PO TABS
1.0000 | ORAL_TABLET | ORAL | Status: AC
  Administered 2022-03-03: 1 via ORAL
  Filled 2022-03-03: qty 1

## 2022-03-03 MED ORDER — SODIUM CHLORIDE 0.9 % IV SOLN: INTRAVENOUS | Status: DC

## 2022-03-03 MED ORDER — ONDANSETRON 8 MG PO TBDP
8.0000 mg | ORAL_TABLET | Freq: Three times a day (TID) | ORAL | Status: DC | PRN
  Administered 2022-03-04 (×2): 8 mg via ORAL
  Filled 2022-03-03 (×3): qty 1

## 2022-03-03 MED ORDER — SPIRONOLACTONE 25 MG PO TABS
25.0000 mg | ORAL_TABLET | Freq: Two times a day (BID) | ORAL | Status: DC
  Administered 2022-03-03 – 2022-03-04 (×3): 25 mg via ORAL
  Filled 2022-03-03 (×4): qty 1

## 2022-03-03 MED ORDER — IOHEXOL 350 MG/ML SOLN
100.0000 mL | Freq: Once | INTRAVENOUS | Status: AC | PRN
Start: 2022-03-03 — End: 2022-03-03
  Administered 2022-03-03: 100 mL via INTRAVENOUS

## 2022-03-03 MED ORDER — NICOTINE 21 MG/24HR TD PT24
21.0000 mg | MEDICATED_PATCH | Freq: Every day | TRANSDERMAL | Status: DC
  Filled 2022-03-03 (×3): qty 1

## 2022-03-03 MED ORDER — METHYLPHENIDATE HCL ER (LA) 30 MG PO CP24: 30.0000 mg | ORAL_CAPSULE | Freq: Every morning | ORAL | Status: DC

## 2022-03-03 MED ORDER — CARVEDILOL 6.25 MG PO TABS
25.0000 mg | ORAL_TABLET | Freq: Two times a day (BID) | ORAL | Status: DC
  Administered 2022-03-04 – 2022-03-05 (×3): 25 mg via ORAL
  Filled 2022-03-03 (×4): qty 4

## 2022-03-03 MED ORDER — METHYLPHENIDATE HCL 10 MG PO TABS: 20.0000 mg | ORAL_TABLET | Freq: Every day | ORAL | Status: DC

## 2022-03-03 MED ORDER — HYDROMORPHONE HCL 1 MG/ML IJ SOLN
1.0000 mg | INTRAMUSCULAR | Status: DC | PRN
  Administered 2022-03-04 – 2022-03-05 (×7): 1 mg via INTRAVENOUS
  Filled 2022-03-03 (×6): qty 1

## 2022-03-03 MED ORDER — HYDROMORPHONE HCL 1 MG/ML IJ SOLN
1.0000 mg | INTRAMUSCULAR | Status: AC
  Administered 2022-03-03: 1 mg via INTRAVENOUS
  Filled 2022-03-03: qty 1

## 2022-03-03 MED ORDER — HYDROMORPHONE HCL 1 MG/ML IJ SOLN
1.0000 mg | INTRAMUSCULAR | Status: AC
  Administered 2022-03-03: 1 mg via INTRAMUSCULAR
  Filled 2022-03-03: qty 1

## 2022-03-03 MED ORDER — PRAZOSIN HCL 1 MG PO CAPS
1.0000 mg | ORAL_CAPSULE | Freq: Two times a day (BID) | ORAL | Status: DC
  Administered 2022-03-03 – 2022-03-05 (×4): 1 mg via ORAL
  Filled 2022-03-03 (×4): qty 1

## 2022-03-03 MED ORDER — ARIPIPRAZOLE 10 MG PO TABS
10.0000 mg | ORAL_TABLET | Freq: Every day | ORAL | Status: DC
  Administered 2022-03-03 – 2022-03-04 (×2): 10 mg via ORAL
  Filled 2022-03-03 (×2): qty 1

## 2022-03-03 MED ORDER — SODIUM CHLORIDE 0.9% FLUSH
3.0000 mL | Freq: Two times a day (BID) | INTRAVENOUS | Status: DC
  Administered 2022-03-03 – 2022-03-05 (×3): 3 mL via INTRAVENOUS

## 2022-03-03 MED ORDER — ACETAMINOPHEN 650 MG RE SUPP: 650.0000 mg | Freq: Four times a day (QID) | RECTAL | Status: DC | PRN

## 2022-03-03 MED ORDER — SENNOSIDES-DOCUSATE SODIUM 8.6-50 MG PO TABS: 2.0000 | ORAL_TABLET | Freq: Every day | ORAL | Status: DC | PRN

## 2022-03-03 MED ORDER — ACETAMINOPHEN 325 MG PO TABS
650.0000 mg | ORAL_TABLET | Freq: Four times a day (QID) | ORAL | Status: DC | PRN
Start: 2022-03-03 — End: 2022-03-05
  Administered 2022-03-04: 650 mg via ORAL
  Filled 2022-03-03: qty 2

## 2022-03-03 NOTE — Assessment & Plan Note (Signed)
Acute left sided  testicular pain. No back pain or hip or grain pain or pain radiation. Ct abd and pelvis/ USG unrevealing.  Differentials also include left hip pain radiating to the left testicle pinched nerve root compression pain. Although isolated pain presentation goes against that theory.  Greatly appreciate urology consult management and care and coming to see the patient as soon as possible.

## 2022-03-03 NOTE — Assessment & Plan Note (Signed)
Vitals:   03/03/22 1810  BP: (!) 176/119  Elevated blood pressure secondary to pain. We will continue patient's home regimen of Coreg lisinopril Aldactone. As needed Dilaudid. Maintenance IV fluid regimen.

## 2022-03-03 NOTE — H&P (Signed)
History and Physical    Mark Austin FFM:[id number] DOB: [birth year] DOA: 03/03/2022  PCP: [name], MD    Patient coming from:  Home    Chief Complaint:  Left testicular pain   HPI:  Mark Austin is a 41 y.o. male seen in ed with complaints of left testicular pain since past few days and progressively getting worse. Pt is sitting up and crying and states he was not that bad before  today is severe.pt is in distress due to pain. Pt is smoker and uses 1PPD.d/w him about risk of ongoing smoking.  Patient denies any back pain or any GI symptoms any urinary symptoms any saddle anesthesia any radiating discomfort any alarm symptoms or trauma Pt has past medical history of allergy to 9 Medications please see complete list.  ED Course:   Vitals:   03/03/22 1808 03/03/22 1810  BP:  (!) 176/119  Pulse:  87  Resp:  20  Temp:  98.5 F (36.9 C)  SpO2:  96%  Weight: 99.8 kg   Height: '5\' 11"'$  (1.803 m)   In the emergency room patient's blood pressure is elevated secondary to pain. CBC shows a 19.5 has been chronically elevated. BMP is within normal limits and glucose of 113. Received a call report from [facility] radiology: Dr Christa See: Ct abd and pelvis is negative. ? Segmental infarction.  No lymphoma.not sarcoid.rind like uniform PTSD. Recommend MRI will wait for urology.   Review of Systems:  Review of Systems  All other systems reviewed and are negative.  Past Medical History:  Diagnosis Date   Anxiety    Back ache    Depression    Diverticulitis    GERD (gastroesophageal reflux disease)    History of kidney stones    Hypertension    Inguinal hernia    PTSD (post-traumatic stress disorder)    Seizure ([place])    over 20 yrs ago.  related to medications    Past Surgical History:  Procedure Laterality Date   back surgeries     x5 lumbar L4L5   BACK SURGERY     BOWEL RESECTION  2016   CHOLECYSTECTOMY  2017   COLON SURGERY     COLONOSCOPY     COLONOSCOPY WITH  PROPOFOL N/A 03/19/2021   Procedure: COLONOSCOPY WITH BIOPSY;  Surgeon: Lucilla Lame, MD;  Location: [place];  Service: Endoscopy;  Laterality: N/A;   EXCISION MASS LOWER EXTREMETIES Left 11/30/2019   Procedure: EXCISION MASS LOWER EXTREMETIES, Left Hip;  Surgeon: Jules Husbands, MD;  Location: [facility];  Service: General;  Laterality: Left;   HERNIA REPAIR     INGUINAL HERNIA REPAIR Left    POLYPECTOMY N/A 03/19/2021   Procedure: POLYPECTOMY;  Surgeon: Lucilla Lame, MD;  Location: [place];  Service: Endoscopy;  Laterality: N/A;   SHOULDER SURGERY Right    rotator cuff tear   XI ROBOTIC ASSISTED INGUINAL HERNIA REPAIR WITH MESH Right 10/08/2019   Procedure: XI ROBOTIC ASSISTED INGUINAL HERNIA REPAIR WITH MESH;  Surgeon: Jules Husbands, MD;  Location: [facility];  Service: General;  Laterality: Right;     reports that he has quit smoking. He has quit using smokeless tobacco. He reports that he does not currently use alcohol. He reports current drug use. Frequency: 2.00 times per week. Drug: Other-see comments.  Allergies  Allergen Reactions   Ketorolac Tromethamine Anaphylaxis    Edema of the tongue, Edema, Swelling, Seizure   Lidocaine Other (See Comments)  Intolerant to lidocaine patches: Seizure   Tramadol Other (See Comments)    Seizures    Atorvastatin Other (See Comments)    Muscle aches   Brassica Oleracea Hives    "Cauliflower"   Carisoprodol Other (See Comments)    Sedation   Carvedilol Other (See Comments)    Headache   Gabapentin Other (See Comments)    "Feels like skin is wanting to be removed from body"   Rosuvastatin Other (See Comments)    Muscle aches    Family History  Problem Relation Age of Onset   Healthy Mother    Healthy Father     Prior to Admission medications   Medication Sig Start Date End Date Taking? Authorizing Provider  anastrozole (ARIMIDEX) 1 MG tablet Take 1 mg by mouth once a week. 05/08/21   [provider]   ARIPiprazole (ABILIFY) 10 MG tablet Take 10 mg by mouth at bedtime. 09/13/21   [provider]  aspirin 81 MG EC tablet Take 81 mg by mouth daily. 06/12/20   [provider]  benztropine (COGENTIN) 1 MG tablet Take 1 mg by mouth daily. 09/13/21   [provider]  carisoprodol (SOMA) 250 MG tablet Take 250 mg by mouth 3 (three) times daily as needed. 06/13/21   [provider]  carvedilol (COREG) 25 MG tablet Take 25 mg by mouth 2 (two) times daily. 09/13/21   [provider]  cyclobenzaprine (FLEXERIL) 10 MG tablet Take 1 tablet (10 mg total) by mouth 3 (three) times daily as needed. 07/24/21   [name], MD  dicyclomine (BENTYL) 10 MG capsule Take 1 capsule (10 mg total) by mouth 3 (three) times daily as needed for up to 14 days for spasms. or abdominal pain 05/30/21 10/01/21  [name], MD  ezetimibe (ZETIA) 10 MG tablet Take 10 mg by mouth daily. 06/27/21   [provider]  Famotidine (ZANTAC 360 PO) Take 1 tablet by mouth as needed.    [provider]  LATUDA 20 MG TABS tablet Take 20 mg by mouth daily. 07/10/21   [provider]  lisinopril (ZESTRIL) 40 MG tablet TAKE 1 TABLET(40 MG) BY MOUTH IN THE MORNING AND AT BEDTIME 07/28/21   [name], MD  LORazepam (ATIVAN) 1 MG tablet Take 1 mg by mouth 2 (two) times daily. 07/12/21   [provider]  methylphenidate (RITALIN LA) 30 MG 24 hr capsule Take 30 mg by mouth every morning. 01/08/21   [provider]  methylphenidate (RITALIN) 20 MG tablet Take 20 mg by mouth daily. 03/07/21   [provider]  morphine (MSIR) 15 MG tablet Take 15 mg by mouth in the morning and at bedtime.    [provider]  ondansetron (ZOFRAN-ODT) 8 MG disintegrating tablet Take 1 tablet (8 mg total) by mouth every 8 (eight) hours as needed for nausea or vomiting. 02/02/22   Arta Silence, MD  prazosin (MINIPRESS) 1 MG capsule TAKE 1 CAPSULE(1  MG) BY MOUTH TWICE DAILY 07/28/21   [name], MD  senna-docusate (SENOKOT-S) 8.6-50 MG tablet Take 2 tablets by mouth as needed. 05/30/21   [name], MD  spironolactone (ALDACTONE) 25 MG tablet Take 25 mg by mouth 2 (two) times daily. 06/19/21   [provider]  tadalafil (CIALIS) 20 MG tablet Take 20 mg by mouth daily as needed for erectile dysfunction.    [provider]  tapentadol (NUCYNTA) 100 MG 12 hr tablet Take 100 mg  by mouth in the morning and at bedtime. 07/06/10   [provider]  testosterone cypionate (DEPOTESTOTERONE CYPIONATE) 100 MG/ML injection Inject 200 mg into the muscle every 7 (seven) days. For IM use only  Thursday's    [provider]  varenicline (CHANTIX) 1 MG tablet Take 1 mg by mouth 2 (two) times daily.    [provider]  zolpidem (AMBIEN) 10 MG tablet Take 10 mg by mouth at bedtime as needed. 09/13/21   [provider]    Physical Exam: Vitals:   03/03/22 1808 03/03/22 1810  BP:  (!) 176/119  Pulse:  87  Resp:  20  Temp:  98.5 F (36.9 C)  SpO2:  96%  Weight: 99.8 kg   Height: '5\' 11"'$  (1.803 m)    Physical Exam Vitals and nursing note reviewed.  Constitutional:      General: He is in acute distress.     Appearance: He is obese. He is not ill-appearing, toxic-appearing or diaphoretic.  HENT:     Head: Normocephalic and atraumatic.     Right Ear: Hearing normal.     Left Ear: Hearing normal.     Nose: No nasal deformity.     Mouth/Throat:     Lips: Pink.     Tongue: No lesions.  Eyes:     Extraocular Movements: Extraocular movements intact.  Neck:     Vascular: No carotid bruit.  Cardiovascular:     Rate and Rhythm: Normal rate and regular rhythm.     Pulses: Normal pulses.     Heart sounds: Normal heart sounds.  Pulmonary:     Effort: Pulmonary effort is normal.     Breath sounds: Normal breath sounds.  Abdominal:     General: Bowel sounds are normal. There is no  distension.     Palpations: Abdomen is soft. There is no mass.     Tenderness: There is no abdominal tenderness. There is no guarding.     Hernia: No hernia is present.  Musculoskeletal:     Right lower leg: No edema.     Left lower leg: No edema.  Skin:    General: Skin is warm.  Neurological:     General: No focal deficit present.     Mental Status: He is alert and oriented to person, place, and time.     Cranial Nerves: Cranial nerves 2-12 are intact.     Motor: Motor function is intact.  Psychiatric:        Attention and Perception: Attention normal.        Mood and Affect: Mood normal.        Speech: Speech normal.        Behavior: Behavior normal. Behavior is cooperative.        Cognition and Memory: Cognition normal.   GU exam deferred due to severe pain on movement pt states no swelling only pain.   Labs on Admission: I have personally reviewed following labs and imaging studies.  BMET Recent Labs  Lab 03/03/22 1812  NA 138  K 3.6  CL 107  CO2 23  BUN 15  CREATININE 1.10  GLUCOSE 113*   Electrolytes Recent Labs  Lab 03/03/22 1812  CALCIUM 10.0   Sepsis Markers Recent Labs  Lab 03/03/22 2130  LATICACIDVEN 0.7   ABG No results for input(s): "PHART", "PCO2ART", "PO2ART" in the last 168 hours. Liver Enzymes No results for input(s): "AST", "ALT", "ALKPHOS", "BILITOT", "ALBUMIN" in the last 168 hours. Cardiac Enzymes  No results for input(s): "TROPONINI", "PROBNP" in the last 168 hours. No results found for: "DDIMER" Coag's No results for input(s): "APTT", "INR" in the last 168 hours.  Recent Results (from the past 240 hour(s))  Chlamydia/NGC rt PCR ([place] only)     Status: None   Collection Time: 03/03/22  6:11 PM   Specimen: Urine  Result Value Ref Range Status   Specimen source GC/Chlam URINE, RANDOM  Final   Chlamydia Tr NOT DETECTED NOT DETECTED Final   N gonorrhoeae NOT DETECTED NOT DETECTED Final    Comment: (NOTE) This CT/NG assay has not  been evaluated in patients with a history of  hysterectomy. Performed at [facility], [address] [place], [place] [address]      Current Facility-Administered Medications:    HYDROmorphone (DILAUDID) injection 1 mg, 1 mg, Intramuscular, Q4H PRN, Para Skeans, MD  Current Outpatient Medications:    anastrozole (ARIMIDEX) 1 MG tablet, Take 1 mg by mouth once a week., Disp: , Rfl:    ARIPiprazole (ABILIFY) 10 MG tablet, Take 10 mg by mouth at bedtime., Disp: , Rfl:    aspirin 81 MG EC tablet, Take 81 mg by mouth daily., Disp: , Rfl:    benztropine (COGENTIN) 1 MG tablet, Take 1 mg by mouth daily., Disp: , Rfl:    carisoprodol (SOMA) 250 MG tablet, Take 250 mg by mouth 3 (three) times daily as needed., Disp: , Rfl:    carvedilol (COREG) 25 MG tablet, Take 25 mg by mouth 2 (two) times daily., Disp: , Rfl:    cyclobenzaprine (FLEXERIL) 10 MG tablet, Take 1 tablet (10 mg total) by mouth 3 (three) times daily as needed., Disp: 90 tablet, Rfl: 0   dicyclomine (BENTYL) 10 MG capsule, Take 1 capsule (10 mg total) by mouth 3 (three) times daily as needed for up to 14 days for spasms. or abdominal pain, Disp: 30 capsule, Rfl: 0   ezetimibe (ZETIA) 10 MG tablet, Take 10 mg by mouth daily., Disp: , Rfl:    Famotidine (ZANTAC 360 PO), Take 1 tablet by mouth as needed., Disp: , Rfl:    LATUDA 20 MG TABS tablet, Take 20 mg by mouth daily., Disp: , Rfl:    lisinopril (ZESTRIL) 40 MG tablet, TAKE 1 TABLET(40 MG) BY MOUTH IN THE MORNING AND AT BEDTIME, Disp: 60 tablet, Rfl: 5   LORazepam (ATIVAN) 1 MG tablet, Take 1 mg by mouth 2 (two) times daily., Disp: , Rfl:    methylphenidate (RITALIN LA) 30 MG 24 hr capsule, Take 30 mg by mouth every morning., Disp: , Rfl:    methylphenidate (RITALIN) 20 MG tablet, Take 20 mg by mouth daily., Disp: , Rfl:    morphine (MSIR) 15 MG tablet, Take 15 mg by mouth in the morning and at bedtime., Disp: , Rfl:    ondansetron (ZOFRAN-ODT) 8 MG disintegrating  tablet, Take 1 tablet (8 mg total) by mouth every 8 (eight) hours as needed for nausea or vomiting., Disp: 12 tablet, Rfl: 0   prazosin (MINIPRESS) 1 MG capsule, TAKE 1 CAPSULE(1 MG) BY MOUTH TWICE DAILY, Disp: 60 capsule, Rfl: 5   senna-docusate (SENOKOT-S) 8.6-50 MG tablet, Take 2 tablets by mouth as needed., Disp: 60 tablet, Rfl: 0   spironolactone (ALDACTONE) 25 MG tablet, Take 25 mg by mouth 2 (two) times daily., Disp: , Rfl:    tadalafil (CIALIS) 20 MG tablet, Take 20 mg by mouth daily as needed for erectile dysfunction., Disp: , Rfl:  tapentadol (NUCYNTA) 100 MG 12 hr tablet, Take 100 mg by mouth in the morning and at bedtime., Disp: , Rfl:    testosterone cypionate (DEPOTESTOTERONE CYPIONATE) 100 MG/ML injection, Inject 200 mg into the muscle every 7 (seven) days. For IM use only  Thursday's, Disp: , Rfl:    varenicline (CHANTIX) 1 MG tablet, Take 1 mg by mouth 2 (two) times daily., Disp: , Rfl:    zolpidem (AMBIEN) 10 MG tablet, Take 10 mg by mouth at bedtime as needed., Disp: , Rfl:   COVID-19 Labs No results for input(s): "DDIMER", "FERRITIN", "LDH", "CRP" in the last 72 hours. Lab Results  Component Value Date   [place] NEGATIVE 11/26/2019   [place] NEGATIVE 10/07/2019    Radiological Exams on Admission: CT ABDOMEN PELVIS W CONTRAST  Result Date: 03/03/2022 CLINICAL DATA:  Left testicular pain. History of left epididymal resection. EXAM: CT ABDOMEN AND PELVIS WITH CONTRAST TECHNIQUE: Multidetector CT imaging of the abdomen and pelvis was performed using the standard protocol following bolus administration of intravenous contrast. RADIATION DOSE REDUCTION: This exam was performed according to the departmental dose-optimization program which includes automated exposure control, adjustment of the mA and/or kV according to patient size and/or use of iterative reconstruction technique. CONTRAST:  153m OMNIPAQUE IOHEXOL 350 MG/ML SOLN COMPARISON:  02/02/2022 FINDINGS: Lower  chest: No acute abnormality. Hepatobiliary: Stable scattered cysts within the liver. The liver is otherwise unremarkable. Cholecystectomy has been performed. No intra or extrahepatic biliary ductal dilation. Pancreas: Unremarkable Spleen: Unremarkable Adrenals/Urinary Tract: Adrenal glands are unremarkable. Kidneys are normal, without renal calculi, focal lesion, or hydronephrosis. Bladder is unremarkable. Stomach/Bowel: Surgical changes of sigmoid colectomy are identified. Mild diverticulosis of the descending colon. The stomach, small bowel, and large bowel are otherwise unremarkable. No evidence of obstruction or focal inflammation. The appendix is normal. No free intraperitoneal gas or fluid. Vascular/Lymphatic: Mild atherosclerotic calcification within the abdominal aorta. No aortic aneurysm. No pathologic adenopathy within the abdomen and pelvis. Reproductive: Prostate is unremarkable. Other: Small fat containing right inguinal hernia is present. Musculoskeletal: L4-5 spinous process fixation plate is again noted. No acute bone abnormality. No lytic or blastic bone lesion. IMPRESSION: No acute intra-abdominal pathology identified. No definite radiographic explanation for the patient's reported symptoms. Electronically Signed   By: AFidela SalisburyM.D.   On: 03/03/2022 21:26   UK[place]SCROTUM W/DOPPLER  Result Date: 03/03/2022 CLINICAL DATA:  Left testicular pain EXAM: SCROTAL ULTRASOUND DOPPLER ULTRASOUND OF THE TESTICLES TECHNIQUE: Complete ultrasound examination of the testicles, epididymis, and other scrotal structures was performed. Color and spectral Doppler ultrasound were also utilized to evaluate blood flow to the testicles. COMPARISON:  None Available. FINDINGS: Right testicle Measurements: 4.0 x 2.3 x 3.6 cm. Normal parenchymal echogenicity and echotexture. Normal color flow vascularity. No mass or microlithiasis visualized. Left testicle Measurements: 4.5 x 2.1 x 3.2 cm. There is mildly heterogeneous  echogenicity of the parenchyma with a rind-like region of relatively hypoechoic parenchyma along the lateral margin of the epididymis. This region demonstrates relatively decreased color flow vascularity but no associated atrophy or calcification. Altogether, this may reflect the sequela of remote inflammation or trauma, particularly as the adjacent epididymis appears absent. No discrete mass or microlithiasis visualized. Right epididymis:  Normal in size and appearance. Left epididymis:  Absent Hydrocele:  None visualized. Varicocele:  Small left varicocele is present Pulsed Doppler interrogation of both testes demonstrates normal low resistance arterial and venous waveforms bilaterally. IMPRESSION: 1. No evidence of testicular torsion. 2. Mildly heterogeneous echogenicity of the left  testicle with relatively decreased color flow vascularity. This may reflect the sequela of remote inflammation or trauma, particularly as the adjacent epididymis appears absent. 3. Small left varicocele. Electronically Signed   By: Fidela Salisbury M.D.   On: 03/03/2022 19:42    EKG: Independently reviewed.  None    Assessment and Plan: Left testicular pain Acute left sided  testicular pain. No back pain or hip or grain pain or pain radiation. Ct abd and pelvis/ USG unrevealing.  Differentials also include left hip pain radiating to the left testicle pinched nerve root compression pain. Although isolated pain presentation goes against that theory.  Greatly appreciate urology consult management and care and coming to see the patient as soon as possible.  TIA (transient ischemic attack) We will continue patient on aspirin, Coreg, lisinopril. Counseled patient extensively on tobacco cessation and risks of ongoing smoking.   Hypertension Vitals:   03/03/22 1810  BP: (!) 176/119  Elevated blood pressure secondary to pain. We will continue patient's home regimen of Coreg lisinopril Aldactone. As needed  Dilaudid. Maintenance IV fluid regimen.     DVT prophylaxis:  SCD;s   Code Status:  Full code    Family Communication:  Evola,Christine (Spouse)  [phone] (Mobile)   Disposition Plan:  Home    Consults called:  Urology Dr Elenor Quinones.   Admission status: Observation.      Para Skeans MD [facility]  6 PM- 2 AM. Please contact me via secure Chat 6 PM-2 AM. [phone] ( Pager ) To contact the [facility] Attending or Consulting provider [place] or covering provider during after hours [place], for this patient.   Check the care team in [facility] and look for a) attending/consulting [facility] provider listed and b) the [facility] team listed Log into [email or web address] and use [place] universal password to access. If you do not have the password, please contact the hospital operator. Locate the [facility] provider you are looking for under [facility] and page to a number that you can be directly reached. If you still have difficulty reaching the provider, please page the [facility] (Director on Call) for the Hospitalists listed on amion for assistance. [email or web address] 03/03/2022, 9:59 PM

## 2022-03-03 NOTE — Hospital Course (Signed)
Admission severe left testicular pain 2 days. Has h/o Epididymectomy 8 years ago in [place]..  Urology - dr Elenor Quinones . Usg- heterogenous appearance of testicle. Inflammation.

## 2022-03-03 NOTE — ED Triage Notes (Signed)
Pt via POV from home. Pt c/o L sided testicular pain and swelling. Denies any urinary symptoms. States it has been going on for 3 days. Pt states it is tender to touch. Denies any blood in his urine. Pt is A&OX4 and NAD

## 2022-03-03 NOTE — Assessment & Plan Note (Signed)
We will continue patient on aspirin, Coreg, lisinopril. Counseled patient extensively on tobacco cessation and risks of ongoing smoking.

## 2022-03-03 NOTE — Consult Note (Signed)
Urology Consult  Requesting physician: Delman Kitten, MD  Reason for consultation: Left scrotal pain  Chief Complaint: Scrotal pain  History of Present Illness: Mark Austin is a 41 y.o. male presented to the ED today with a 3-day history of left hemiscrotal pain.  The pain has gradually worsened and he stayed in bed most of the day yesterday due to discomfort.  States his wife noted slight left hemiscrotal swelling though this has resolved.  Denies dysuria, frequency, urgency or gross hematuria.  Pain became severe today necessitating ED visit.  Past history remarkable for a left epididymectomy ~ 6 years ago at a [facility] in [place].  Scrotal sonogram was performed which showed partial rim of hypoechoic testis parenchyma laterally on left with slight decreased Doppler flow.  The remainder of the testicular flow was normal bilaterally.  A CT of the abdomen pelvis with contrast was also performed which showed no abnormalities.  He has been afebrile.  Had some nausea yesterday and today which has improved.   States pain has improved with parenteral analgesics.  Other urologic history includes a prior history of stone disease and hypogonadism on TRT   Past Medical History:  Diagnosis Date   Anxiety    Back ache    Depression    Diverticulitis    GERD (gastroesophageal reflux disease)    History of kidney stones    Hypertension    Inguinal hernia    PTSD (post-traumatic stress disorder)    Seizure ([place])    over 20 yrs ago.  related to medications    Past Surgical History:  Procedure Laterality Date   back surgeries     x5 lumbar L4L5   BACK SURGERY     BOWEL RESECTION  2016   CHOLECYSTECTOMY  2017   COLON SURGERY     COLONOSCOPY     COLONOSCOPY WITH PROPOFOL N/A 03/19/2021   Procedure: COLONOSCOPY WITH BIOPSY;  Surgeon: Lucilla Lame, MD;  Location: [place];  Service: Endoscopy;  Laterality: N/A;   EXCISION MASS LOWER EXTREMETIES Left 11/30/2019   Procedure:  EXCISION MASS LOWER EXTREMETIES, Left Hip;  Surgeon: Jules Husbands, MD;  Location: [facility];  Service: General;  Laterality: Left;   HERNIA REPAIR     INGUINAL HERNIA REPAIR Left    POLYPECTOMY N/A 03/19/2021   Procedure: POLYPECTOMY;  Surgeon: Lucilla Lame, MD;  Location: [place];  Service: Endoscopy;  Laterality: N/A;   SHOULDER SURGERY Right    rotator cuff tear   XI ROBOTIC ASSISTED INGUINAL HERNIA REPAIR WITH MESH Right 10/08/2019   Procedure: XI ROBOTIC ASSISTED INGUINAL HERNIA REPAIR WITH MESH;  Surgeon: Jules Husbands, MD;  Location: [facility];  Service: General;  Laterality: Right;    Home Medications:  No outpatient medications have been marked as taking for the 03/03/22 encounter [facility] Encounter).    Allergies:  Allergies  Allergen Reactions   Ketorolac Tromethamine Anaphylaxis    Edema of the tongue, Edema, Swelling, Seizure   Lidocaine Other (See Comments)    Intolerant to lidocaine patches: Seizure   Tramadol Other (See Comments)    Seizures    Atorvastatin Other (See Comments)    Muscle aches   Brassica Oleracea Hives    "Cauliflower"   Carisoprodol Other (See Comments)    Sedation   Carvedilol Other (See Comments)    Headache   Gabapentin Other (See Comments)    "Feels like skin is wanting to be removed from body"   Rosuvastatin  Other (See Comments)    Muscle aches    Family History  Problem Relation Age of Onset   Healthy Mother    Healthy Father     Social History:  reports that he has quit smoking. He has quit using smokeless tobacco. He reports that he does not currently use alcohol. He reports current drug use. Frequency: 2.00 times per week. Drug: Other-see comments.  ROS: A complete review of systems was performed.  All systems are negative except for pertinent findings as noted.  Physical Exam:  Vital signs in last 24 hours: Temp:  [98.3 F (36.8 C)-98.5 F (36.9 C)] 98.3 F (36.8 C) (08/06 2223) Pulse Rate:  [87] 87 (08/06  1810) Resp:  [20] 20 (08/06 1810) BP: (176)/(119) 176/119 (08/06 1810) SpO2:  [96 %] 96 % (08/06 1810) Weight:  [99.8 kg] 99.8 kg (08/06 1808) Constitutional:  Alert, mild distress secondary to scrotal pain HEENT: [place] AT, moist mucus membranes.  Trachea midline, no masses Respiratory: Normal respiratory effort GI: Abdomen is soft, nontender, nondistended, no abdominal masses GU: Phallus without lesions.  Testes descended bilaterally.  No scrotal edema, erythema.  Bilateral cremasteric reflexes right testis palpably normal.  Tenderness right testis worse posterior.  No induration/mass appreciated Skin: No rashes, bruises or suspicious lesions Lymph: No cervical or inguinal adenopathy Neurologic: Grossly intact, no focal deficits, moving all 4 extremities Psychiatric: Normal mood and affect   Laboratory Data:  Recent Labs    03/03/22 1812  WBC 10.0  HGB 19.5*  HCT 58.6*   Recent Labs    03/03/22 1812  NA 138  K 3.6  CL 107  CO2 23  GLUCOSE 113*  BUN 15  CREATININE 1.10  CALCIUM 10.0   No results for input(s): "LABPT", "INR" in the last 72 hours. No results for input(s): "LABURIN" in the last 72 hours. Results for orders placed or performed during the hospital encounter of 03/03/22  [place] rt PCR ([place] only)     Status: None   Collection Time: 03/03/22  6:11 PM   Specimen: Urine  Result Value Ref Range Status   Specimen source GC/Chlam URINE, RANDOM  Final   Chlamydia Tr NOT DETECTED NOT DETECTED Final   N gonorrhoeae NOT DETECTED NOT DETECTED Final    Comment: (NOTE) This CT/NG assay has not been evaluated in patients with a history of  hysterectomy. Performed at [facility], [address] [place], [place] [address]      Radiologic Imaging: CT and ultrasound images were personally reviewed and interpreted  CT ABDOMEN PELVIS W CONTRAST  Result Date: 03/03/2022 CLINICAL DATA:  Left testicular pain. History of left epididymal resection. EXAM:  CT ABDOMEN AND PELVIS WITH CONTRAST TECHNIQUE: Multidetector CT imaging of the abdomen and pelvis was performed using the standard protocol following bolus administration of intravenous contrast. RADIATION DOSE REDUCTION: This exam was performed according to the departmental dose-optimization program which includes automated exposure control, adjustment of the mA and/or kV according to patient size and/or use of iterative reconstruction technique. CONTRAST:  111m OMNIPAQUE IOHEXOL 350 MG/ML SOLN COMPARISON:  02/02/2022 FINDINGS: Lower chest: No acute abnormality. Hepatobiliary: Stable scattered cysts within the liver. The liver is otherwise unremarkable. Cholecystectomy has been performed. No intra or extrahepatic biliary ductal dilation. Pancreas: Unremarkable Spleen: Unremarkable Adrenals/Urinary Tract: Adrenal glands are unremarkable. Kidneys are normal, without renal calculi, focal lesion, or hydronephrosis. Bladder is unremarkable. Stomach/Bowel: Surgical changes of sigmoid colectomy are identified. Mild diverticulosis of the descending colon. The stomach, small bowel,  and large bowel are otherwise unremarkable. No evidence of obstruction or focal inflammation. The appendix is normal. No free intraperitoneal gas or fluid. Vascular/Lymphatic: Mild atherosclerotic calcification within the abdominal aorta. No aortic aneurysm. No pathologic adenopathy within the abdomen and pelvis. Reproductive: Prostate is unremarkable. Other: Small fat containing right inguinal hernia is present. Musculoskeletal: L4-5 spinous process fixation plate is again noted. No acute bone abnormality. No lytic or blastic bone lesion. IMPRESSION: No acute intra-abdominal pathology identified. No definite radiographic explanation for the patient's reported symptoms. Electronically Signed   By: Fidela Salisbury M.D.   On: 03/03/2022 21:26   US SCROTUM W/DOPPLER  Result Date: 03/03/2022 CLINICAL DATA:  Left testicular pain EXAM: SCROTAL  ULTRASOUND DOPPLER ULTRASOUND OF THE TESTICLES TECHNIQUE: Complete ultrasound examination of the testicles, epididymis, and other scrotal structures was performed. Color and spectral Doppler ultrasound were also utilized to evaluate blood flow to the testicles. COMPARISON:  None Available. FINDINGS: Right testicle Measurements: 4.0 x 2.3 x 3.6 cm. Normal parenchymal echogenicity and echotexture. Normal color flow vascularity. No mass or microlithiasis visualized. Left testicle Measurements: 4.5 x 2.1 x 3.2 cm. There is mildly heterogeneous echogenicity of the parenchyma with a rind-like region of relatively hypoechoic parenchyma along the lateral margin of the epididymis. This region demonstrates relatively decreased color flow vascularity but no associated atrophy or calcification. Altogether, this may reflect the sequela of remote inflammation or trauma, particularly as the adjacent epididymis appears absent. No discrete mass or microlithiasis visualized. Right epididymis:  Normal in size and appearance. Left epididymis:  Absent Hydrocele:  None visualized. Varicocele:  Small left varicocele is present Pulsed Doppler interrogation of both testes demonstrates normal low resistance arterial and venous waveforms bilaterally. IMPRESSION: 1. No evidence of testicular torsion. 2. Mildly heterogeneous echogenicity of the left testicle with relatively decreased color flow vascularity. This may reflect the sequela of remote inflammation or trauma, particularly as the adjacent epididymis appears absent. 3. Small left varicocele. Electronically Signed   By: Fidela Salisbury M.D.   On: 03/03/2022 19:42    Impression/Assessment:  41 y.o. male with history severe left hemiscrotal pain.  Crescentic area of hypoechoic parenchyma with slight decreased flow lateral aspect left testis but otherwise normal flow bilaterally.  Based on these findings there is no indication for surgical intervention.  Etiology of his pain unclear at  this point.  He has had a prior epididymectomy and the ultrasound findings could be postsurgical.  Recommendation:  Continue parenteral/oral analgesics as needed Will reassess in a.m.  Repeat scrotal ultrasound if worsening   03/03/2022, 10:35 PM  John Giovanni,  MD

## 2022-03-03 NOTE — ED Provider Notes (Signed)
[facility] Provider Note    Event Date/Time   First MD Initiated Contact with Patient 03/03/22 1826     (approximate)   History   Testicle Pain   HPI  Mark Austin is a 41 y.o. male with a history of severe chronic lower back pain depression and diverticulitis and hypertension as well as previous seizure that was induced once by either tramadol or Toradol he is not sure which one  For 3 days been experiencing pain in the back of his left testicle.  Seems slightly swollen yesterday but that seems of gone away.  No redness no fevers no chills.  No nausea or vomiting.  Reports area is quite sore very painful just at the very back of the left testicle.  Reports the pain is excruciating.  No pain or burning with urination.  No penile discomfort.  No pain around the rectum  Reports he had to have a previous epididymectomy (several years ago at the [facility] in [place]) pain was in the same spot similar but bit more severe at that point, still having fairly severe pain today though     Physical Exam   Triage Vital Signs: ED Triage Vitals  Enc Vitals Group     BP 03/03/22 1810 (!) 176/119     Pulse Rate 03/03/22 1810 87     Resp 03/03/22 1810 20     Temp 03/03/22 1810 98.5 F (36.9 C)     Temp src --      SpO2 03/03/22 1810 96 %     Weight 03/03/22 1808 220 lb (99.8 kg)     Height 03/03/22 1808 '5\' 11"'$  (5.852 m)     Head Circumference --      Peak Flow --      Pain Score 03/03/22 1808 9     Pain Loc --      Pain Edu? --      Excl. in [place]? --     Most recent vital signs: Vitals:   03/03/22 1810  BP: (!) 176/119  Pulse: 87  Resp: 20  Temp: 98.5 F (36.9 C)  SpO2: 96%     General: Awake, no distress.  When he sits or moves he reports pain in the left testicular and appears painful. CV:  Good peripheral perfusion.  Resp:  Normal effort.  Abd:  No distention.  Abdomen soft nontender.  No groin pain or masses.  No erythema of the  perineum.  Visual inspection the scrotum and circumcised penis appear normal.  To palpation the right testicle and right scrotum are nontender.  The left testicle has point tenderness at the posterior aspect of it.  The left scrotum appears normal.  There is no's edema or scrotal erythema Other:    Overall normal examination except for very much point tenderness at the posterior aspect of the left testicle.  Scrotum and perineum appear normal, there is no edema erythema or warmth of the area.  No evidence of what appears to be infection of the overlying skin or scrotum itself.  ED Results / Procedures / Treatments   Labs (all labs ordered are listed, but only abnormal results are displayed) Labs Reviewed  CBC - Abnormal; Notable for the following components:      Result Value   RBC 6.40 (*)    Hemoglobin 19.5 (*)    HCT 58.6 (*)    All other components within normal limits  BASIC METABOLIC PANEL - Abnormal; Notable  for the following components:   Glucose, Bld 113 (*)    All other components within normal limits  URINALYSIS, ROUTINE W REFLEX MICROSCOPIC - Abnormal; Notable for the following components:   Color, Urine YELLOW (*)    APPearance HAZY (*)    Ketones, ur 20 (*)    All other components within normal limits  CHLAMYDIA/NGC RT PCR ([facility] ONLY)            URINE CULTURE     EKG     RADIOLOGY  US SCROTUM W/DOPPLER  Result Date: 03/03/2022 CLINICAL DATA:  Left testicular pain EXAM: SCROTAL ULTRASOUND DOPPLER ULTRASOUND OF THE TESTICLES TECHNIQUE: Complete ultrasound examination of the testicles, epididymis, and other scrotal structures was performed. Color and spectral Doppler ultrasound were also utilized to evaluate blood flow to the testicles. COMPARISON:  None Available. FINDINGS: Right testicle Measurements: 4.0 x 2.3 x 3.6 cm. Normal parenchymal echogenicity and echotexture. Normal color flow vascularity. No mass or microlithiasis visualized. Left testicle Measurements:  4.5 x 2.1 x 3.2 cm. There is mildly heterogeneous echogenicity of the parenchyma with a rind-like region of relatively hypoechoic parenchyma along the lateral margin of the epididymis. This region demonstrates relatively decreased color flow vascularity but no associated atrophy or calcification. Altogether, this may reflect the sequela of remote inflammation or trauma, particularly as the adjacent epididymis appears absent. No discrete mass or microlithiasis visualized. Right epididymis:  Normal in size and appearance. Left epididymis:  Absent Hydrocele:  None visualized. Varicocele:  Small left varicocele is present Pulsed Doppler interrogation of both testes demonstrates normal low resistance arterial and venous waveforms bilaterally. IMPRESSION: 1. No evidence of testicular torsion. 2. Mildly heterogeneous echogenicity of the left testicle with relatively decreased color flow vascularity. This may reflect the sequela of remote inflammation or trauma, particularly as the adjacent epididymis appears absent. 3. Small left varicocele. Electronically Signed   By: Fidela Salisbury M.D.   On: 03/03/2022 19:42    Ultrasound report reviewed.  No discrete testicular torsion.  However there is report as noted above that raises a potential concern for decreased color flow vascularity.  PROCEDURES:  Critical Care performed: No  Procedures   MEDICATIONS ORDERED IN ED: Medications  HYDROmorphone (DILAUDID) injection 1 mg (1 mg Intramuscular Given 03/03/22 1842)  oxyCODONE-acetaminophen (PERCOCET/ROXICET) 5-325 MG per tablet 1 tablet (1 tablet Oral Given 03/03/22 1843)  HYDROmorphone (DILAUDID) injection 1 mg (1 mg Intravenous Given 03/03/22 2003)  ondansetron (ZOFRAN) injection 4 mg (4 mg Intravenous Given 03/03/22 2002)     IMPRESSION / MDM / [place] / ED COURSE  I reviewed the triage vital signs and the nursing notes.                              Differential diagnosis includes, but is not limited  to, possible orchitis, testicular inflammation, testicular torsion, etc.  Does not extend into the inguinal canal does not extend up into the groin.  Perineum is normal.  There is no evidence of be suggestive of acute necrotizing process by clinical assessment and visual examination.  Patient reports pretty severe pain with movement and to touch, will give hydromorphone IM and oral oxycodone for pain relief.  Reports a allergy to Toradol or tramadol not sure which induced seizure.  Obtain scrotal ultrasound to evaluate for etiology and rule out torsion.  Urinalysis reveals yellow slightly hazy appearance but no evidence of acute infection including no nitrates or leukocytes.  Urine culture sent and pending  Labs are reviewed remarkable for normal chemistry.  Elevated hemoglobin, this appears to be chronic in nature.  Patient's presentation is most consistent with acute presentation with potential threat to life or bodily function.   Clinical Course as of 03/03/22 2025  Nancy Fetter Mar 03, 2022  1952 Reviewed ultrasound report by radiologist.  Patient continues to have severe excruciating pain in the left testicle.  He is fully alert and he has tears in his eyes from the level of pain at this point.  He also reports some nausea.  IV Dilaudid ordered, patient fully alert, continuous pulse oximetry ordered, IV Zofran, and I have paged our neurologist Dr. Bernardo Heater to request consultation for concerns of severe left testicular pain [MQ]    Clinical Course User Index [MQ] Delman Kitten, MD   ----------------------------------------- 8:24 PM on 03/03/2022 ----------------------------------------- Consulted with hospitalist, Dr. Girard Cooter who admit the patient.  Dr. Bernardo Heater advising that he will be coming in providing consult for the patient shortly.  Dr. Bernardo Heater advising he will see the patient this evening, and if hospitalist would admit for pain control he would also consider having repeat testicular scrotal  ultrasound performed tomorrow to see if acute changes present.  Discussed with the patient patient understand agreeable with plan for admission for pain control, urology consult that is pending for this evening, and further care and treatment under the hospitalist service.  Pain somewhat improved after additional dose of hydromorphone  FINAL CLINICAL IMPRESSION(S) / ED DIAGNOSES   Final diagnoses:  Testicular pain, left   ----------------------------------------- 8:25 PM on 03/03/2022 ----------------------------------------- Ongoing ED care assigned to my oncoming partner Dr. Duffy Bruce who continue ED care as patient awaits urology consult and hospitalist admission  Dr. Posey Pronto currently seeing the patient  Rx / DC Orders   ED Discharge Orders     None        Note:  This document was prepared using Dragon voice recognition software and may include unintentional dictation errors.   Delman Kitten, MD 03/03/22 2026

## 2022-03-04 ENCOUNTER — Encounter: Payer: Self-pay | Admitting: Internal Medicine

## 2022-03-04 DIAGNOSIS — N5082 Scrotal pain: Secondary | ICD-10-CM | POA: Diagnosis not present

## 2022-03-04 DIAGNOSIS — N50812 Left testicular pain: Secondary | ICD-10-CM | POA: Diagnosis not present

## 2022-03-04 LAB — COMPREHENSIVE METABOLIC PANEL
ALT: 107 U/L — ABNORMAL HIGH (ref 0–44)
AST: 147 U/L — ABNORMAL HIGH (ref 15–41)
Albumin: 4.2 g/dL (ref 3.5–5.0)
Alkaline Phosphatase: 62 U/L (ref 38–126)
Anion gap: 9 (ref 5–15)
BUN: 14 mg/dL (ref 6–20)
CO2: 26 mmol/L (ref 22–32)
Calcium: 9.4 mg/dL (ref 8.9–10.3)
Chloride: 105 mmol/L (ref 98–111)
Creatinine, Ser: 0.94 mg/dL (ref 0.61–1.24)
GFR, Estimated: >60 mL/min (ref >60)
Glucose, Bld: 98 mg/dL (ref 70–99)
Potassium: 3.8 mmol/L (ref 3.5–5.1)
Sodium: 140 mmol/L (ref 135–145)
Total Bilirubin: 1.8 mg/dL — ABNORMAL HIGH (ref 0.3–1.2)
Total Protein: 7.4 g/dL (ref 6.5–8.1)

## 2022-03-04 LAB — CBC
HCT: 53.7 % — ABNORMAL HIGH (ref 39.0–52.0)
Hemoglobin: 18.2 g/dL — ABNORMAL HIGH (ref 13.0–17.0)
MCH: 30.7 pg (ref 26.0–34.0)
MCHC: 33.9 g/dL (ref 30.0–36.0)
MCV: 90.6 fL (ref 80.0–100.0)
Platelets: 167 10*3/uL (ref 150–400)
RBC: 5.93 MIL/uL — ABNORMAL HIGH (ref 4.22–5.81)
RDW: 12.5 % (ref 11.5–15.5)
WBC: 6.8 10*3/uL (ref 4.0–10.5)
nRBC: 0 % (ref 0.0–0.2)

## 2022-03-04 LAB — URINE CULTURE: Culture: NO GROWTH

## 2022-03-04 LAB — LACTIC ACID, PLASMA: Lactic Acid, Venous: 0.7 mmol/L (ref 0.5–1.9)

## 2022-03-04 LAB — C-REACTIVE PROTEIN: CRP: <0.5 mg/dL (ref <1.0)

## 2022-03-04 MED ORDER — HYDRALAZINE HCL 20 MG/ML IJ SOLN
10.0000 mg | INTRAMUSCULAR | Status: DC | PRN
  Administered 2022-03-04: 10 mg via INTRAVENOUS
  Filled 2022-03-04: qty 1

## 2022-03-04 MED ORDER — OXYCODONE HCL 5 MG PO TABS
5.0000 mg | ORAL_TABLET | Freq: Four times a day (QID) | ORAL | Status: DC | PRN
  Administered 2022-03-05: 5 mg via ORAL
  Filled 2022-03-04: qty 1

## 2022-03-04 NOTE — Progress Notes (Signed)
Urology Inpatient Progress Note  Subjective: No acute events overnight. MR left hip without contrast performed overnight with no significant findings. Gonorrhea and chlamydia testing negative, urine culture pending. Not on abx. Today he reports his pain has improved compared to yesterday. He denies scrotal edema. He is having some dizziness 2/2 pain meds.  Anti-infectives: Anti-infectives (From admission, onward)    None       Current Facility-Administered Medications  Medication Dose Route Frequency Provider Last Rate Last Admin   0.9 %  sodium chloride infusion   Intravenous Continuous Para Skeans, MD 50 mL/hr at 03/04/22 0113 New Bag at 03/04/22 0113   acetaminophen (TYLENOL) tablet 650 mg  650 mg Oral Q6H PRN Para Skeans, MD   650 mg at 03/04/22 0258   Or   acetaminophen (TYLENOL) suppository 650 mg  650 mg Rectal Q6H PRN Para Skeans, MD       anastrozole (ARIMIDEX) tablet 1 mg  1 mg Oral Weekly Florina Ou V, MD       ARIPiprazole (ABILIFY) tablet 10 mg  10 mg Oral QHS Florina Ou V, MD   10 mg at 03/03/22 2327   aspirin EC tablet 81 mg  81 mg Oral Daily Para Skeans, MD       benztropine (COGENTIN) tablet 1 mg  1 mg Oral Daily Para Skeans, MD       carvedilol (COREG) tablet 25 mg  25 mg Oral BID WC Para Skeans, MD       ezetimibe (ZETIA) tablet 10 mg  10 mg Oral Daily Florina Ou V, MD       hydrALAZINE (APRESOLINE) injection 10 mg  10 mg Intravenous Q4H PRN Para Skeans, MD   10 mg at 03/04/22 0654   HYDROmorphone (DILAUDID) injection 1 mg  1 mg Intravenous Q4H PRN Para Skeans, MD   1 mg at 03/04/22 0508   lisinopril (ZESTRIL) tablet 40 mg  40 mg Oral Daily Para Skeans, MD       LORazepam (ATIVAN) tablet 1 mg  1 mg Oral BID Florina Ou V, MD   1 mg at 03/03/22 2327   morphine (MSIR) tablet 15 mg  15 mg Oral QHS Florina Ou V, MD   15 mg at 03/03/22 2327   nicotine (NICODERM CQ - dosed in mg/24 hours) patch 21 mg  21 mg Transdermal Daily Para Skeans, MD        ondansetron (ZOFRAN-ODT) disintegrating tablet 8 mg  8 mg Oral Q8H PRN Para Skeans, MD   8 mg at 03/04/22 0818   prazosin (MINIPRESS) capsule 1 mg  1 mg Oral BID Florina Ou V, MD   1 mg at 03/03/22 2327   senna-docusate (Senokot-S) tablet 2 tablet  2 tablet Oral Daily PRN Para Skeans, MD       sodium chloride flush (NS) 0.9 % injection 3 mL  3 mL Intravenous Q12H Florina Ou V, MD   3 mL at 03/03/22 2331   spironolactone (ALDACTONE) tablet 25 mg  25 mg Oral BID Para Skeans, MD   25 mg at 03/03/22 2327   varenicline (CHANTIX) tablet 1 mg  1 mg Oral BID Para Skeans, MD       zolpidem (AMBIEN) tablet 10 mg  10 mg Oral QHS PRN Para Skeans, MD       Objective: Vital signs in last 24 hours: Temp:  [97.4 F (36.3 C)-98.6 F (37 C)]  98.6 F (37 C) (08/07 0812) Pulse Rate:  [50-87] 64 (08/07 0812) Resp:  [17-20] 17 (08/07 0437) BP: (159-187)/(92-119) 166/99 (08/07 0812) SpO2:  [96 %-100 %] 100 % (08/07 0812) Weight:  [99.8 kg-103.2 kg] 103.2 kg (08/07 0500)  Intake/Output from previous day: No intake/output data recorded. Intake/Output this shift: Total I/O In: 297.4 [I.V.:297.4] Out: 550 [Urine:550]  Physical Exam Vitals and nursing note reviewed.  Constitutional:      General: He is not in acute distress.    Appearance: He is not ill-appearing, toxic-appearing or diaphoretic.  HENT:     Head: Normocephalic and atraumatic.  Pulmonary:     Effort: Pulmonary effort is normal. No respiratory distress.  Genitourinary:    Comments: Bilateral descended, atrophic testes. Left epididymis surgically absent. Focal tenderness of the left posterior testis. No scrotal edema, erythema, fluctuance, crepitus, or purulence. Skin:    General: Skin is warm and dry.  Neurological:     Mental Status: He is alert and oriented to person, place, and time.  Psychiatric:        Mood and Affect: Mood normal.        Behavior: Behavior normal.    Lab Results:  Recent Labs    03/03/22 1812  03/04/22 0506  WBC 10.0 6.8  HGB 19.5* 18.2*  HCT 58.6* 53.7*  PLT 234 167   BMET Recent Labs    03/03/22 1812 03/04/22 0506  NA 138 140  K 3.6 3.8  CL 107 105  CO2 23 26  GLUCOSE 113* 98  BUN 15 14  CREATININE 1.10 0.94  CALCIUM 10.0 9.4   Studies/Results: MR HIP LEFT WO CONTRAST  Result Date: 03/04/2022 CLINICAL DATA:  Left testicular pain. Bilateral inguinal hernia repair. EXAM: MR OF THE LEFT HIP WITHOUT CONTRAST TECHNIQUE: Multiplanar, multisequence MR imaging was performed. No intravenous contrast was administered. COMPARISON:  Scrotal ultrasound 03/03/2022 FINDINGS: Bones: No hip fracture, dislocation or avascular necrosis. No periosteal reaction or bone destruction. No aggressive osseous lesion. Normal sacrum and sacroiliac joints. No SI joint widening or erosive changes. Degenerative disease with mild disc height loss at L4-5 and L5-S1. Articular cartilage and labrum Articular cartilage:  No chondral defect. Labrum: Grossly intact, but evaluation is limited by lack of intraarticular fluid. Joint or bursal effusion Joint effusion:  No hip joint effusion.  No SI joint effusion. Bursae:  No bursa formation. Muscles and tendons Flexors: Normal. Extensors: Normal. Abductors: Normal. Adductors: Normal. Gluteals: Normal. Hamstrings: Normal. Other findings No pelvic free fluid. No fluid collection or hematoma. No inguinal lymphadenopathy. Fat containing right inguinal hernia. IMPRESSION: 1. No hip fracture, dislocation or avascular necrosis. 2. Fat containing right inguinal hernia. Electronically Signed   By: Kathreen Devoid M.D.   On: 03/04/2022 07:07   CT ABDOMEN PELVIS W CONTRAST  Result Date: 03/03/2022 CLINICAL DATA:  Left testicular pain. History of left epididymal resection. EXAM: CT ABDOMEN AND PELVIS WITH CONTRAST TECHNIQUE: Multidetector CT imaging of the abdomen and pelvis was performed using the standard protocol following bolus administration of intravenous contrast. RADIATION  DOSE REDUCTION: This exam was performed according to the departmental dose-optimization program which includes automated exposure control, adjustment of the mA and/or kV according to patient size and/or use of iterative reconstruction technique. CONTRAST:  149m OMNIPAQUE IOHEXOL 350 MG/ML SOLN COMPARISON:  02/02/2022 FINDINGS: Lower chest: No acute abnormality. Hepatobiliary: Stable scattered cysts within the liver. The liver is otherwise unremarkable. Cholecystectomy has been performed. No intra or extrahepatic biliary ductal dilation. Pancreas: Unremarkable Spleen: Unremarkable  Adrenals/Urinary Tract: Adrenal glands are unremarkable. Kidneys are normal, without renal calculi, focal lesion, or hydronephrosis. Bladder is unremarkable. Stomach/Bowel: Surgical changes of sigmoid colectomy are identified. Mild diverticulosis of the descending colon. The stomach, small bowel, and large bowel are otherwise unremarkable. No evidence of obstruction or focal inflammation. The appendix is normal. No free intraperitoneal gas or fluid. Vascular/Lymphatic: Mild atherosclerotic calcification within the abdominal aorta. No aortic aneurysm. No pathologic adenopathy within the abdomen and pelvis. Reproductive: Prostate is unremarkable. Other: Small fat containing right inguinal hernia is present. Musculoskeletal: L4-5 spinous process fixation plate is again noted. No acute bone abnormality. No lytic or blastic bone lesion. IMPRESSION: No acute intra-abdominal pathology identified. No definite radiographic explanation for the patient's reported symptoms. Electronically Signed   By: Fidela Salisbury M.D.   On: 03/03/2022 21:26   US SCROTUM W/DOPPLER  Result Date: 03/03/2022 CLINICAL DATA:  Left testicular pain EXAM: SCROTAL ULTRASOUND DOPPLER ULTRASOUND OF THE TESTICLES TECHNIQUE: Complete ultrasound examination of the testicles, epididymis, and other scrotal structures was performed. Color and spectral Doppler ultrasound were also  utilized to evaluate blood flow to the testicles. COMPARISON:  None Available. FINDINGS: Right testicle Measurements: 4.0 x 2.3 x 3.6 cm. Normal parenchymal echogenicity and echotexture. Normal color flow vascularity. No mass or microlithiasis visualized. Left testicle Measurements: 4.5 x 2.1 x 3.2 cm. There is mildly heterogeneous echogenicity of the parenchyma with a rind-like region of relatively hypoechoic parenchyma along the lateral margin of the epididymis. This region demonstrates relatively decreased color flow vascularity but no associated atrophy or calcification. Altogether, this may reflect the sequela of remote inflammation or trauma, particularly as the adjacent epididymis appears absent. No discrete mass or microlithiasis visualized. Right epididymis:  Normal in size and appearance. Left epididymis:  Absent Hydrocele:  None visualized. Varicocele:  Small left varicocele is present Pulsed Doppler interrogation of both testes demonstrates normal low resistance arterial and venous waveforms bilaterally. IMPRESSION: 1. No evidence of testicular torsion. 2. Mildly heterogeneous echogenicity of the left testicle with relatively decreased color flow vascularity. This may reflect the sequela of remote inflammation or trauma, particularly as the adjacent epididymis appears absent. 3. Small left varicocele. Electronically Signed   By: Fidela Salisbury M.D.   On: 03/03/2022 19:42    Assessment & Plan: 41 year old male s/p remote left epididymectomy presenting with acute left scrotal pain with ultrasound findings of a crescentic area of hypoechoic parenchyma with slightly decreased flow to the lateral aspect of the left testis, possibly postsurgical.  He is clinically improving today.  I elevated his scrotum and he had some relief with this.  Counseled him to continue scrotal elevation to assist with pain control.    No indication for repeat imaging at this time.  We discussed prioritizing pain control with  plans for repeat imaging if his symptoms worsen or return.  He is in agreement with this plan.  Debroah Loop, PA-C 03/04/2022

## 2022-03-04 NOTE — Progress Notes (Signed)
$'10mg'u$  of Hydralazine was given for BP of 167/92.This intervention report was passed off to the day shift RN.

## 2022-03-04 NOTE — Progress Notes (Signed)
Patient resting in bed. IV infusing without difficulty. Pain meds given prn for left testicular pain with relief. Call bell in reach, bed lowest position. No needs at this time per patient.

## 2022-03-04 NOTE — TOC Progression Note (Signed)
Transition of Care [facility]) - Progression Note    Patient Details  Name: Mark Austin MRN: [id number] Date of Birth: [birth year]  Transition of Care [facility]) CM/SW [place], RN Phone Number: 03/04/2022, 9:36 AM  Clinical Narrative:      Transition of Care (TOC) Screening Note   Patient Details  Name: Mark Austin Date of Birth: 12-09-1980   Transition of Care [facility]) CM/SW Contact:    Conception Oms, RN Phone Number: 03/04/2022, 9:36 AM    Transition of Care Department [facility]) has reviewed patient and no TOC needs have been identified at this time. We will continue to monitor patient advancement through interdisciplinary progression rounds. If new patient transition needs arise, please place a TOC consult.         Expected Discharge Plan and Services                                                 Social Determinants of Health (SDOH) Interventions    Readmission Risk Interventions     No data to display

## 2022-03-04 NOTE — Progress Notes (Addendum)
[id number]  Mark Austin from [facility] called in to state that the report for the MRI on the left hip w/o contrast will be available in the morning.  Mark Austin did not know the time that the report was going to be ready.

## 2022-03-04 NOTE — Progress Notes (Signed)
Progress Note    Mark Austin  YKZ:[id number] DOB: 1981/07/09  DOA: 03/03/2022 PCP: [name], MD      Brief Narrative:    Medical records reviewed and are as summarized below:  Mark Austin is a 41 y.o. 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  He also reported 2-day history of diarrhea prior to admission.  He was admitted to the hospital for acute left scrotal pain of unclear etiology.   Assessment/Plan:   Principal Problem:   Left testicular pain Active Problems:   Hypertension   TIA (transient ischemic attack)   Body mass index is 31.74 kg/m.  (Obesity)   Acute left scrotal pain: Etiology is unclear.  Scrotal ultrasound did not show any testicular torsion.  Continue scrotal support and analgesics as needed.  Follow-up with urologist for further recommendations.  Hypertension: Discontinue IV fluids.  Continue antihypertensives   History of TIA: Continue aspirin  Depression, anxiety, PTSD: Continue psychotropics  Chronic back pain: Analgesics as needed for pain   Diet Order             Diet Heart Room service appropriate? Yes; Fluid consistency: Thin  Diet effective now                            Consultants: Urologist  Procedures: None    Medications:    anastrozole  1 mg Oral Weekly   ARIPiprazole  10 mg Oral QHS   aspirin EC  81 mg Oral Daily   benztropine  1 mg Oral Daily   carvedilol  25 mg Oral BID WC   ezetimibe  10 mg Oral Daily   lisinopril  40 mg Oral Daily   LORazepam  1 mg Oral BID   morphine  15 mg Oral QHS   nicotine  21 mg Transdermal Daily   prazosin  1 mg Oral BID   sodium chloride flush  3 mL Intravenous Q12H    spironolactone  25 mg Oral BID   varenicline  1 mg Oral BID   Continuous Infusions:  sodium chloride 50 mL/hr at 03/04/22 0113     Anti-infectives (From admission, onward)    None              Family Communication/Anticipated D/C date and plan/Code Status   DVT prophylaxis:      Code Status: Full Code  Family Communication: None Disposition Plan: Plan to discharge home in 1 to 2 days   Status is: Observation The patient will require care spanning > 2 midnights and should be moved to inpatient because: Pain control for severe scrotal pain       Subjective:   Interval events noted.  He complains of pain in the left scrotal area.  He had diarrhea in the last 2 days but no diarrhea today.  Objective:    Vitals:   03/04/22 0437 03/04/22 0500 03/04/22 0645 03/04/22 0812  BP: (!) 159/100  (!) 167/92 (!) 166/99  Pulse: (!) 56  (!) 50 64  Resp: 17     Temp: (!) 97.4 F (36.3 C)   98.6 F (37 C)  TempSrc:      SpO2: 100%  97% 100%  Weight:  103.2  kg    Height:       No data found.   Intake/Output Summary (Last 24 hours) at 03/04/2022 1232 Last data filed at 03/04/2022 0829 Gross per 24 hour  Intake 297.43 ml  Output 550 ml  Net -252.57 ml   Filed Weights   03/03/22 1808 03/04/22 0500  Weight: 99.8 kg 103.2 kg    Exam:  GEN: NAD SKIN: No rash EYES: EOMI ENT: MMM CV: RRR PULM: CTA B ABD: soft, ND, NT, +BS CNS: AAO x 3, non focal EXT: No edema or tenderness GU: Left scrotal tenderness.  No scrotal erythema or swelling        Data Reviewed:   I have personally reviewed following labs and imaging studies:  Labs: Labs show the following:   Basic Metabolic Panel: Recent Labs  Lab 03/03/22 1812 03/04/22 0506  NA 138 140  K 3.6 3.8  CL 107 105  CO2 23 26  GLUCOSE 113* 98  BUN 15 14  CREATININE 1.10 0.94  CALCIUM 10.0 9.4   GFR Estimated Creatinine Clearance: 126.5 mL/min (by C-G formula based on SCr of 0.94 mg/dL). Liver  Function Tests: Recent Labs  Lab 03/04/22 0506  AST 147*  ALT 107*  ALKPHOS 62  BILITOT 1.8*  PROT 7.4  ALBUMIN 4.2   No results for input(s): "LIPASE", "AMYLASE" in the last 168 hours. No results for input(s): "AMMONIA" in the last 168 hours. Coagulation profile No results for input(s): "INR", "PROTIME" in the last 168 hours.  CBC: Recent Labs  Lab 03/03/22 1812 03/04/22 0506  WBC 10.0 6.8  HGB 19.5* 18.2*  HCT 58.6* 53.7*  MCV 91.6 90.6  PLT 234 167   Cardiac Enzymes: Recent Labs  Lab 03/03/22 1812  CKTOTAL 238   BNP (last 3 results) No results for input(s): "PROBNP" in the last 8760 hours. CBG: No results for input(s): "GLUCAP" in the last 168 hours. D-Dimer: No results for input(s): "DDIMER" in the last 72 hours. Hgb A1c: No results for input(s): "HGBA1C" in the last 72 hours. Lipid Profile: No results for input(s): "CHOL", "HDL", "LDLCALC", "TRIG", "CHOLHDL", "LDLDIRECT" in the last 72 hours. Thyroid function studies: No results for input(s): "TSH", "T4TOTAL", "T3FREE", "THYROIDAB" in the last 72 hours.  Invalid input(s): "FREET3" Anemia work up: No results for input(s): "VITAMINB12", "FOLATE", "FERRITIN", "TIBC", "IRON", "RETICCTPCT" in the last 72 hours. Sepsis Labs: Recent Labs  Lab 03/03/22 1812 03/03/22 2130 03/04/22 0043 03/04/22 0506  WBC 10.0  --   --  6.8  LATICACIDVEN  --  0.7 0.7  --     Microbiology Recent Results (from the past 240 hour(s))  Chlamydia/NGC rt PCR ([place] only)     Status: None   Collection Time: 03/03/22  6:11 PM   Specimen: Urine  Result Value Ref Range Status   Specimen source GC/Chlam URINE, RANDOM  Final   Chlamydia Tr NOT DETECTED NOT DETECTED Final   N gonorrhoeae NOT DETECTED NOT DETECTED Final    Comment: (NOTE) This CT/NG assay has not been evaluated in patients with a history of  hysterectomy. Performed at [facility], [address] [place], [place] [address]     Procedures and  diagnostic studies:  MR HIP LEFT WO CONTRAST  Result Date: 03/04/2022 CLINICAL DATA:  Left testicular pain. Bilateral inguinal hernia repair. EXAM: MR OF THE LEFT HIP WITHOUT CONTRAST TECHNIQUE: Multiplanar, multisequence MR imaging was performed. No intravenous contrast was administered. COMPARISON:  Scrotal ultrasound 03/03/2022 FINDINGS: Bones: No hip fracture,  dislocation or avascular necrosis. No periosteal reaction or bone destruction. No aggressive osseous lesion. Normal sacrum and sacroiliac joints. No SI joint widening or erosive changes. Degenerative disease with mild disc height loss at L4-5 and L5-S1. Articular cartilage and labrum Articular cartilage:  No chondral defect. Labrum: Grossly intact, but evaluation is limited by lack of intraarticular fluid. Joint or bursal effusion Joint effusion:  No hip joint effusion.  No SI joint effusion. Bursae:  No bursa formation. Muscles and tendons Flexors: Normal. Extensors: Normal. Abductors: Normal. Adductors: Normal. Gluteals: Normal. Hamstrings: Normal. Other findings No pelvic free fluid. No fluid collection or hematoma. No inguinal lymphadenopathy. Fat containing right inguinal hernia. IMPRESSION: 1. No hip fracture, dislocation or avascular necrosis. 2. Fat containing right inguinal hernia. Electronically Signed   By: Kathreen Devoid M.D.   On: 03/04/2022 07:07   CT ABDOMEN PELVIS W CONTRAST  Result Date: 03/03/2022 CLINICAL DATA:  Left testicular pain. History of left epididymal resection. EXAM: CT ABDOMEN AND PELVIS WITH CONTRAST TECHNIQUE: Multidetector CT imaging of the abdomen and pelvis was performed using the standard protocol following bolus administration of intravenous contrast. RADIATION DOSE REDUCTION: This exam was performed according to the departmental dose-optimization program which includes automated exposure control, adjustment of the mA and/or kV according to patient size and/or use of iterative reconstruction technique. CONTRAST:   121m OMNIPAQUE IOHEXOL 350 MG/ML SOLN COMPARISON:  02/02/2022 FINDINGS: Lower chest: No acute abnormality. Hepatobiliary: Stable scattered cysts within the liver. The liver is otherwise unremarkable. Cholecystectomy has been performed. No intra or extrahepatic biliary ductal dilation. Pancreas: Unremarkable Spleen: Unremarkable Adrenals/Urinary Tract: Adrenal glands are unremarkable. Kidneys are normal, without renal calculi, focal lesion, or hydronephrosis. Bladder is unremarkable. Stomach/Bowel: Surgical changes of sigmoid colectomy are identified. Mild diverticulosis of the descending colon. The stomach, small bowel, and large bowel are otherwise unremarkable. No evidence of obstruction or focal inflammation. The appendix is normal. No free intraperitoneal gas or fluid. Vascular/Lymphatic: Mild atherosclerotic calcification within the abdominal aorta. No aortic aneurysm. No pathologic adenopathy within the abdomen and pelvis. Reproductive: Prostate is unremarkable. Other: Small fat containing right inguinal hernia is present. Musculoskeletal: L4-5 spinous process fixation plate is again noted. No acute bone abnormality. No lytic or blastic bone lesion. IMPRESSION: No acute intra-abdominal pathology identified. No definite radiographic explanation for the patient's reported symptoms. Electronically Signed   By: AFidela SalisburyM.D.   On: 03/03/2022 21:26   [place]SCROTUM W/DOPPLER  Result Date: 03/03/2022 CLINICAL DATA:  Left testicular pain EXAM: SCROTAL ULTRASOUND DOPPLER ULTRASOUND OF THE TESTICLES TECHNIQUE: Complete ultrasound examination of the testicles, epididymis, and other scrotal structures was performed. Color and spectral Doppler ultrasound were also utilized to evaluate blood flow to the testicles. COMPARISON:  None Available. FINDINGS: Right testicle Measurements: 4.0 x 2.3 x 3.6 cm. Normal parenchymal echogenicity and echotexture. Normal color flow vascularity. No mass or microlithiasis visualized.  Left testicle Measurements: 4.5 x 2.1 x 3.2 cm. There is mildly heterogeneous echogenicity of the parenchyma with a rind-like region of relatively hypoechoic parenchyma along the lateral margin of the epididymis. This region demonstrates relatively decreased color flow vascularity but no associated atrophy or calcification. Altogether, this may reflect the sequela of remote inflammation or trauma, particularly as the adjacent epididymis appears absent. No discrete mass or microlithiasis visualized. Right epididymis:  Normal in size and appearance. Left epididymis:  Absent Hydrocele:  None visualized. Varicocele:  Small left varicocele is present Pulsed Doppler interrogation of both testes demonstrates normal low resistance arterial and venous waveforms  bilaterally. IMPRESSION: 1. No evidence of testicular torsion. 2. Mildly heterogeneous echogenicity of the left testicle with relatively decreased color flow vascularity. This may reflect the sequela of remote inflammation or trauma, particularly as the adjacent epididymis appears absent. 3. Small left varicocele. Electronically Signed   By: Fidela Salisbury M.D.   On: 03/03/2022 19:42               LOS: 0 days   Tamaya Pun  [facility]   Pager on [email or web address]. If 7PM-7AM, please contact night-coverage at [email or web address]     03/04/2022, 12:32 PM

## 2022-03-04 NOTE — Plan of Care (Signed)

## 2022-03-05 DIAGNOSIS — I10 Essential (primary) hypertension: Secondary | ICD-10-CM | POA: Diagnosis not present

## 2022-03-05 DIAGNOSIS — N5082 Scrotal pain: Secondary | ICD-10-CM | POA: Diagnosis not present

## 2022-03-05 NOTE — Discharge Summary (Signed)
Physician Discharge Summary   Patient: Mark Austin MRN: [id number] DOB: April 30, 1981  Admit date:     03/03/2022  Discharge date: {dischdate:26783}  Discharge Physician: Jennye Boroughs   PCP: [name], MD   Recommendations at discharge:  {Tip this will not be part of the note when signed- Example include specific recommendations for outpatient follow-up, pending tests to follow-up on. (Optional):26781}  ***  Discharge Diagnoses: Principal Problem:   Scrotal pain, left Active Problems:   Hypertension  Resolved Problems:   * No resolved hospital problems. [facility] Course: Admission severe left testicular pain 2 days. Has h/o Epididymectomy 8 years ago in [place]..  Urology - dr Elenor Quinones . Usg- heterogenous appearance of testicle. Inflammation.    Ana, RN, at the bedside   Assessment and Plan: * Scrotal pain, left Acute left sided  testicular pain. No back pain or hip or grain pain or pain radiation. Ct abd and pelvis/ USG unrevealing.  Differentials also include left hip pain radiating to the left testicle pinched nerve root compression pain. Although isolated pain presentation goes against that theory.  Greatly appreciate urology consult management and care and coming to see the patient as soon as possible.  TIA (transient ischemic attack) We will continue patient on aspirin, Coreg, lisinopril. Counseled patient extensively on tobacco cessation and risks of ongoing smoking.   Hypertension Vitals:   03/03/22 1810  BP: (!) 176/119  Elevated blood pressure secondary to pain. We will continue patient's home regimen of Coreg lisinopril Aldactone. As needed Dilaudid. Maintenance IV fluid regimen.        {Tip this will not be part of the note when signed Body mass index is 31.74 kg/m. , ,  (Optional):26781}  {(NOTE) Pain control PDMP Statment (Optional):26782} Consultants: *** Procedures performed: ***  Disposition: {Plan; Disposition:26390} Diet  recommendation:  Discharge Diet Orders (From admission, onward)     Start     Ordered   03/05/22 0000  Diet - low sodium heart healthy        03/05/22 1356           {Diet_Plan:26776} DISCHARGE MEDICATION: Allergies as of 03/05/2022       Reactions   Ketorolac Tromethamine Anaphylaxis   Edema of the tongue, Edema, Swelling, Seizure   Lidocaine Other (See Comments)   Intolerant to lidocaine patches: Seizure   Tramadol Other (See Comments)   Seizures   Atorvastatin Other (See Comments)   Muscle aches   Brassica Oleracea Hives   "Cauliflower"   Carisoprodol Other (See Comments)   Sedation   Gabapentin Other (See Comments)   "Feels like skin is wanting to be removed from body"   Rosuvastatin Other (See Comments)   Muscle aches        Medication List     STOP taking these medications    ezetimibe 10 MG tablet Commonly known as: ZETIA   Latuda 20 MG Tabs tablet Generic drug: lurasidone   methylphenidate 20 MG tablet Commonly known as: RITALIN   methylphenidate 30 MG 24 hr capsule Commonly known as: RITALIN LA   morphine 15 MG tablet Commonly known as: MSIR   senna-docusate 8.6-50 MG tablet Commonly known as: Senokot-S   spironolactone 25 MG tablet Commonly known as: ALDACTONE   tapentadol 100 MG 12 hr tablet Commonly known as: NUCYNTA   varenicline 1 MG tablet Commonly known as: CHANTIX   ZANTAC 360 PO   zolpidem 10 MG tablet Commonly known as: AMBIEN  TAKE these medications    ALPRAZolam 2 MG 24 hr tablet Commonly known as: XANAX XR Take 2 mg by mouth every morning.   anastrozole 1 MG tablet Commonly known as: ARIMIDEX Take 1 mg by mouth once a week.   ARIPiprazole 15 MG tablet Commonly known as: ABILIFY Take 15 mg by mouth at bedtime. What changed: Another medication with the same name was removed. Continue taking this medication, and follow the directions you see here.   aspirin EC 81 MG tablet Take 81 mg by mouth daily.    benztropine 1 MG tablet Commonly known as: COGENTIN Take 1 mg by mouth 2 (two) times daily.   carvedilol 25 MG tablet Commonly known as: COREG Take 25 mg by mouth 2 (two) times daily.   cyclobenzaprine 10 MG tablet Commonly known as: FLEXERIL Take 1 tablet (10 mg total) by mouth 3 (three) times daily as needed.   dicyclomine 10 MG capsule Commonly known as: Bentyl Take 1 capsule (10 mg total) by mouth 3 (three) times daily as needed for up to 14 days for spasms. or abdominal pain   ibuprofen 200 MG tablet Commonly known as: ADVIL Take 400 mg by mouth every 6 (six) hours as needed for mild pain.   lisinopril 40 MG tablet Commonly known as: ZESTRIL TAKE 1 TABLET(40 MG) BY MOUTH IN THE MORNING AND AT BEDTIME What changed: See the new instructions.   modafinil 200 MG tablet Commonly known as: PROVIGIL Take 200 mg by mouth every morning.   ondansetron 8 MG disintegrating tablet Commonly known as: ZOFRAN-ODT Take 1 tablet (8 mg total) by mouth every 8 (eight) hours as needed for nausea or vomiting.   prazosin 1 MG capsule Commonly known as: MINIPRESS TAKE 1 CAPSULE(1 MG) BY MOUTH TWICE DAILY What changed: See the new instructions.   tadalafil 20 MG tablet Commonly known as: CIALIS Take 20 mg by mouth daily as needed for erectile dysfunction.   testosterone cypionate 100 MG/ML injection Commonly known as: DEPOTESTOTERONE CYPIONATE Inject 200 mg into the muscle every 7 (seven) days. For IM use only  Thursday's   zaleplon 5 MG capsule Commonly known as: SONATA Take 5 mg by mouth at bedtime. May take another in the middle of the night as needed.        Follow-up Information     Stoioff, Ronda Fairly, MD. Call.   Specialty: Urology Why: If symptoms worsen Contact information: [place] [id number] [phone]                Discharge Exam: Danley Danker Weights   03/03/22 1808 03/04/22 0500  Weight: 99.8 kg 103.2 kg    ***  Condition at discharge: {DC Condition:26389}  The results of significant diagnostics from this hospitalization (including imaging, microbiology, ancillary and laboratory) are listed below for reference.   Imaging Studies: MR HIP LEFT WO CONTRAST  Result Date: 03/04/2022 CLINICAL DATA:  Left testicular pain. Bilateral inguinal hernia repair. EXAM: MR OF THE LEFT HIP WITHOUT CONTRAST TECHNIQUE: Multiplanar, multisequence MR imaging was performed. No intravenous contrast was administered. COMPARISON:  Scrotal ultrasound 03/03/2022 FINDINGS: Bones: No hip fracture, dislocation or avascular necrosis. No periosteal reaction or bone destruction. No aggressive osseous lesion. Normal sacrum and sacroiliac joints. No SI joint widening or erosive changes. Degenerative disease with mild disc height loss at L4-5 and L5-S1. Articular cartilage and labrum Articular cartilage:  No chondral defect. Labrum: Grossly intact, but evaluation is limited by lack of intraarticular fluid.  Joint or bursal effusion Joint effusion:  No hip joint effusion.  No SI joint effusion. Bursae:  No bursa formation. Muscles and tendons Flexors: Normal. Extensors: Normal. Abductors: Normal. Adductors: Normal. Gluteals: Normal. Hamstrings: Normal. Other findings No pelvic free fluid. No fluid collection or hematoma. No inguinal lymphadenopathy. Fat containing right inguinal hernia. IMPRESSION: 1. No hip fracture, dislocation or avascular necrosis. 2. Fat containing right inguinal hernia. Electronically Signed   By: Kathreen Devoid M.D.   On: 03/04/2022 07:07   CT ABDOMEN PELVIS W CONTRAST  Result Date: 03/03/2022 CLINICAL DATA:  Left testicular pain. History of left epididymal resection. EXAM: CT ABDOMEN AND PELVIS WITH CONTRAST TECHNIQUE: Multidetector CT imaging of the abdomen and pelvis was performed using the standard protocol following bolus administration of intravenous contrast. RADIATION DOSE REDUCTION: This exam was performed  according to the departmental dose-optimization program which includes automated exposure control, adjustment of the mA and/or kV according to patient size and/or use of iterative reconstruction technique. CONTRAST:  180m OMNIPAQUE IOHEXOL 350 MG/ML SOLN COMPARISON:  02/02/2022 FINDINGS: Lower chest: No acute abnormality. Hepatobiliary: Stable scattered cysts within the liver. The liver is otherwise unremarkable. Cholecystectomy has been performed. No intra or extrahepatic biliary ductal dilation. Pancreas: Unremarkable Spleen: Unremarkable Adrenals/Urinary Tract: Adrenal glands are unremarkable. Kidneys are normal, without renal calculi, focal lesion, or hydronephrosis. Bladder is unremarkable. Stomach/Bowel: Surgical changes of sigmoid colectomy are identified. Mild diverticulosis of the descending colon. The stomach, small bowel, and large bowel are otherwise unremarkable. No evidence of obstruction or focal inflammation. The appendix is normal. No free intraperitoneal gas or fluid. Vascular/Lymphatic: Mild atherosclerotic calcification within the abdominal aorta. No aortic aneurysm. No pathologic adenopathy within the abdomen and pelvis. Reproductive: Prostate is unremarkable. Other: Small fat containing right inguinal hernia is present. Musculoskeletal: L4-5 spinous process fixation plate is again noted. No acute bone abnormality. No lytic or blastic bone lesion. IMPRESSION: No acute intra-abdominal pathology identified. No definite radiographic explanation for the patient's reported symptoms. Electronically Signed   By: AFidela SalisburyM.D.   On: 03/03/2022 21:26   [place]SCROTUM W/DOPPLER  Result Date: 03/03/2022 CLINICAL DATA:  Left testicular pain EXAM: SCROTAL ULTRASOUND DOPPLER ULTRASOUND OF THE TESTICLES TECHNIQUE: Complete ultrasound examination of the testicles, epididymis, and other scrotal structures was performed. Color and spectral Doppler ultrasound were also utilized to evaluate blood flow to the  testicles. COMPARISON:  None Available. FINDINGS: Right testicle Measurements: 4.0 x 2.3 x 3.6 cm. Normal parenchymal echogenicity and echotexture. Normal color flow vascularity. No mass or microlithiasis visualized. Left testicle Measurements: 4.5 x 2.1 x 3.2 cm. There is mildly heterogeneous echogenicity of the parenchyma with a rind-like region of relatively hypoechoic parenchyma along the lateral margin of the epididymis. This region demonstrates relatively decreased color flow vascularity but no associated atrophy or calcification. Altogether, this may reflect the sequela of remote inflammation or trauma, particularly as the adjacent epididymis appears absent. No discrete mass or microlithiasis visualized. Right epididymis:  Normal in size and appearance. Left epididymis:  Absent Hydrocele:  None visualized. Varicocele:  Small left varicocele is present Pulsed Doppler interrogation of both testes demonstrates normal low resistance arterial and venous waveforms bilaterally. IMPRESSION: 1. No evidence of testicular torsion. 2. Mildly heterogeneous echogenicity of the left testicle with relatively decreased color flow vascularity. This may reflect the sequela of remote inflammation or trauma, particularly as the adjacent epididymis appears absent. 3. Small left varicocele. Electronically Signed   By: AFidela SalisburyM.D.   On: 03/03/2022 19:42  Microbiology: Results for orders placed or performed during the hospital encounter of 03/03/22  [place] rt PCR ([place] only)     Status: None   Collection Time: 03/03/22  6:11 PM   Specimen: Urine  Result Value Ref Range Status   Specimen source GC/Chlam URINE, RANDOM  Final   Chlamydia Tr NOT DETECTED NOT DETECTED Final   N gonorrhoeae NOT DETECTED NOT DETECTED Final    Comment: (NOTE) This CT/NG assay has not been evaluated in patients with a history of  hysterectomy. Performed at [facility], [address], [place], [place] [address]    Urine Culture     Status: None   Collection Time: 03/03/22  6:11 PM   Specimen: Urine, Clean Catch  Result Value Ref Range Status   Specimen Description   Final    URINE, CLEAN CATCH Performed at [facility], [address] [place], [place] [address]    Special Requests   Final    NONE Performed at [facility], [address], [place], [place] [address]    Culture   Final    NO GROWTH Performed at [facility], [place] [address], [place], [place] [address]    Report Status 03/04/2022 FINAL  Final    Labs: CBC: Recent Labs  Lab 03/03/22 1812 03/04/22 0506  WBC 10.0 6.8  HGB 19.5* 18.2*  HCT 58.6* 53.7*  MCV 91.6 90.6  PLT 234 470   Basic Metabolic Panel: Recent Labs  Lab 03/03/22 1812 03/04/22 0506  NA 138 140  K 3.6 3.8  CL 107 105  CO2 23 26  GLUCOSE 113* 98  BUN 15 14  CREATININE 1.10 0.94  CALCIUM 10.0 9.4   Liver Function Tests: Recent Labs  Lab 03/04/22 0506  AST 147*  ALT 107*  ALKPHOS 62  BILITOT 1.8*  PROT 7.4  ALBUMIN 4.2   CBG: No results for input(s): "GLUCAP" in the last 168 hours.  Discharge time spent: {LESS THAN/GREATER JGGE:36629} 30 minutes.  Signed: Jennye Boroughs, MD [facility] 03/05/2022

## 2022-03-05 NOTE — Plan of Care (Signed)
  Problem: Clinical Measurements: Goal: Ability to maintain clinical measurements within normal limits will improve Outcome: Progressing Goal: Cardiovascular complication will be avoided Outcome: Progressing   Problem: Activity: Goal: Risk for activity intolerance will decrease Outcome: Progressing   Problem: Nutrition: Goal: Adequate nutrition will be maintained Outcome: Progressing   Problem: Coping: Goal: Level of anxiety will decrease Outcome: Progressing   Problem: Pain Managment: Goal: General experience of comfort will improve Outcome: Progressing

## 2022-03-05 NOTE — Progress Notes (Signed)
DISCHARGE NOTE:    Pt given discharge instructions, and verbalized understanding. Pt wheeled to car, wife providing transportation.

## 2022-03-05 NOTE — Plan of Care (Signed)
  Problem: Activity: Goal: Risk for activity intolerance will decrease Outcome: Progressing   Problem: Nutrition: Goal: Adequate nutrition will be maintained Outcome: Progressing   Problem: Coping: Goal: Level of anxiety will decrease Outcome: Progressing   Problem: Pain Managment: Goal: General experience of comfort will improve Outcome: Progressing   Problem: Safety: Goal: Ability to remain free from injury will improve Outcome: Progressing   

## 2022-03-07 ENCOUNTER — Encounter: Payer: Self-pay | Admitting: *Deleted

## 2022-04-10 ENCOUNTER — Encounter: Payer: Self-pay | Admitting: Urology

## 2022-04-10 ENCOUNTER — Ambulatory Visit: Payer: Managed Care, Other (non HMO) | Admitting: Urology

## 2022-06-03 ENCOUNTER — Emergency Department: Payer: Managed Care, Other (non HMO)

## 2022-06-03 ENCOUNTER — Other Ambulatory Visit: Payer: Self-pay

## 2022-06-03 ENCOUNTER — Emergency Department
Admission: EM | Admit: 2022-06-03 | Discharge: 2022-06-03 | Disposition: A | Discharge status: Home / Self Care | Payer: Managed Care, Other (non HMO) | Source: Home / Self Care | Attending: Emergency Medicine | Admitting: Emergency Medicine

## 2022-06-03 DIAGNOSIS — R519 Headache, unspecified: Secondary | ICD-10-CM | POA: Insufficient documentation

## 2022-06-03 DIAGNOSIS — R079 Chest pain, unspecified: Secondary | ICD-10-CM

## 2022-06-03 DIAGNOSIS — I10 Essential (primary) hypertension: Secondary | ICD-10-CM | POA: Insufficient documentation

## 2022-06-03 DIAGNOSIS — I160 Hypertensive urgency: Secondary | ICD-10-CM | POA: Diagnosis not present

## 2022-06-03 DIAGNOSIS — I161 Hypertensive emergency: Secondary | ICD-10-CM | POA: Diagnosis not present

## 2022-06-03 LAB — BASIC METABOLIC PANEL
Anion gap: 8 (ref 5–15)
BUN: 10 mg/dL (ref 6–20)
CO2: 25 mmol/L (ref 22–32)
Calcium: 9.4 mg/dL (ref 8.9–10.3)
Chloride: 104 mmol/L (ref 98–111)
Creatinine, Ser: 0.98 mg/dL (ref 0.61–1.24)
GFR, Estimated: >60 mL/min (ref >60)
Glucose, Bld: 91 mg/dL (ref 70–99)
Potassium: 4 mmol/L (ref 3.5–5.1)
Sodium: 137 mmol/L (ref 135–145)

## 2022-06-03 LAB — APTT: aPTT: 32 seconds (ref 24–36)

## 2022-06-03 LAB — CBC
HCT: 53.1 % — ABNORMAL HIGH (ref 39.0–52.0)
Hemoglobin: 18.6 g/dL — ABNORMAL HIGH (ref 13.0–17.0)
MCH: 30.8 pg (ref 26.0–34.0)
MCHC: 35 g/dL (ref 30.0–36.0)
MCV: 87.9 fL (ref 80.0–100.0)
Platelets: 198 10*3/uL (ref 150–400)
RBC: 6.04 MIL/uL — ABNORMAL HIGH (ref 4.22–5.81)
RDW: 12.1 % (ref 11.5–15.5)
WBC: 7.5 10*3/uL (ref 4.0–10.5)
nRBC: 0 % (ref 0.0–0.2)

## 2022-06-03 LAB — TROPONIN I (HIGH SENSITIVITY)
Troponin I (High Sensitivity): 4 ng/L (ref <18)
Troponin I (High Sensitivity): 3 ng/L (ref <18)

## 2022-06-03 LAB — PROTIME-INR
INR: 1 (ref 0.8–1.2)
Prothrombin Time: 12.6 seconds (ref 11.4–15.2)

## 2022-06-03 MED ORDER — ACETAMINOPHEN 500 MG PO TABS
1000.0000 mg | ORAL_TABLET | Freq: Once | ORAL | Status: AC
  Administered 2022-06-03: 1000 mg via ORAL
  Filled 2022-06-03: qty 2

## 2022-06-03 MED ORDER — LABETALOL HCL 5 MG/ML IV SOLN
5.0000 mg | Freq: Once | INTRAVENOUS | Status: AC
  Administered 2022-06-03: 5 mg via INTRAVENOUS
  Filled 2022-06-03: qty 4

## 2022-06-03 MED ORDER — DIPHENHYDRAMINE HCL 50 MG/ML IJ SOLN
25.0000 mg | Freq: Once | INTRAMUSCULAR | Status: AC
  Administered 2022-06-03: 25 mg via INTRAVENOUS
  Filled 2022-06-03: qty 1

## 2022-06-03 MED ORDER — METOCLOPRAMIDE HCL 5 MG/ML IJ SOLN
10.0000 mg | Freq: Once | INTRAMUSCULAR | Status: AC
  Administered 2022-06-03: 10 mg via INTRAVENOUS
  Filled 2022-06-03: qty 2

## 2022-06-03 MED ORDER — LABETALOL HCL 5 MG/ML IV SOLN
10.0000 mg | Freq: Once | INTRAVENOUS | Status: AC
  Administered 2022-06-03: 10 mg via INTRAVENOUS
  Filled 2022-06-03: qty 4

## 2022-06-03 MED ORDER — MORPHINE SULFATE (PF) 4 MG/ML IV SOLN
4.0000 mg | Freq: Once | INTRAVENOUS | Status: AC
  Administered 2022-06-03: 4 mg via INTRAVENOUS
  Filled 2022-06-03: qty 1

## 2022-06-03 MED ORDER — IOHEXOL 350 MG/ML SOLN
100.0000 mL | Freq: Once | INTRAVENOUS | Status: AC | PRN
  Administered 2022-06-03: 100 mL via INTRAVENOUS

## 2022-06-03 MED ORDER — NITROGLYCERIN 0.4 MG SL SUBL
0.4000 mg | SUBLINGUAL_TABLET | SUBLINGUAL | Status: DC | PRN
  Administered 2022-06-03: 0.4 mg via SUBLINGUAL
  Filled 2022-06-03: qty 1

## 2022-06-03 NOTE — ED Triage Notes (Signed)
Pt to Ed via POV from home. Pt reports left sided CP that radiates to his left arm that started last pm. Pt reports hx HTN and med compliant. Pt also reports he feels dizzy and HA. Pt BP in 615P systolic in triage.

## 2022-06-03 NOTE — Discharge Instructions (Addendum)
Take your blood pressure medications as prescribed and keep a log of your blood pressures over the next few days.  Call your primary care doctor for follow-up.  Return to the ER for new, worsening, or persistent severe headache, blurred vision or other vision changes, difficulty speaking, severe dizziness, weakness or numbness, chest pain, difficulty breathing, or any other new or worsening symptoms that concern you.

## 2022-06-03 NOTE — ED Provider Notes (Addendum)
[facility] Provider Note    Event Date/Time   First MD Initiated Contact with Patient 06/03/22 1017     (approximate)   History   Chest Pain   HPI  Mark Austin is a 41 y.o. male with past medical history of hypertension GERD back pain who presents with chest pain.  Symptoms started this morning several hours ago.  He endorses pressure-like sensation in his chest occasionally radiating down his left arm into his back.  It is worse when he stands up and moves around.  He feels mildly short of breath.  Does not feel exertional or pleuritic pain.  Denies abdominal pain.  He also had some blurred vision disease had some issues with his vision over the last several days.  Denies double vision denies numbness tingling or weakness.  He also has a headache. Endorses throbbing frontal headache came on rather suddenly and has been constant since onset.  Does have history of headaches that he says are typically relieved by ibuprofen. Patient has history of hypertension takes lisinopril, prazosin, and carvedilol for blood pressure  Past Medical History:  Diagnosis Date   Anxiety    Back ache    Depression    Diverticulitis    GERD (gastroesophageal reflux disease)    History of kidney stones    Hypertension    Inguinal hernia    PTSD (post-traumatic stress disorder)    Seizure ([place])    over 20 yrs ago.  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   Annular  tear of lumbar disc (L4-5, L5-S1) 09/12/2021   Lumbar facet arthropathy (L4-5) (Bilateral) 09/12/2021   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) 09/12/2021   Lumbosacral lateral recess stenosis (Right: L5-S1) 09/12/2021   Lumbar intervertebral disc protrusion (L4-5, L5-S1) 09/12/2021   Urinary and fecal incontinence (09/03/2021) 09/12/2021    Class: History of   Failed spinal cord stimulator, 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   Chronic groin pain (Left) 09/12/2021   Chronic hip pain (3ry area of Pain) (Bilateral) (L>R) 09/12/2021   Chronic knee pain (4th area of Pain) (intermittent) (Bilateral) 09/12/2021   Discogenic low back pain (L4-5, L5-S1) 09/12/2021   Lumbar facet joint syndrome 09/12/2021   Primary osteoarthritis of knees, bilateral 07/24/2021   History of lumbar surgery 07/24/2021   Annual physical exam 07/11/2021   Restless legs 07/11/2021   Chronic lower extremity pain (2ry area of Pain) (Bilateral) (L>R) 07/11/2021   Male erectile disorder 05/29/2021   Obstructive sleep apnea of adult 05/29/2021   PTSD (post-traumatic stress disorder) 05/29/2021   Hypogonadism male 05/29/2021   Chronic low back pain (1ry area of Pain) (Midline) w/ sciatica (Bilateral) 05/29/2021   Seizure-like activity ([place]) 05/14/2021   Diverticulitis of colon (without mention of hemorrhage)(562.11)    Polyp of sigmoid colon    Sleep apnea 03/07/2021   Decreased hearing of right ear 03/07/2021   Hypertension 03/07/2021   Chronic abdominal pain 03/07/2021   Migraine headache 03/07/2021   Myalgia and myositis 03/07/2021  Anxiety state 03/07/2021   TIA (transient ischemic attack) 11/25/2016   Constipation 08/22/2016     Physical Exam  Triage Vital Signs: ED Triage Vitals  Enc Vitals Group     BP 06/03/22 1012 (!) 211/107     Pulse Rate 06/03/22 1012 (!) 102     Resp 06/03/22 1012 18     Temp 06/03/22 1012 98.4 F (36.9 C)     Temp src --      SpO2 06/03/22 1012 99 %     Weight 06/03/22 1011  220 lb (99.8 kg)     Height 06/03/22 1011 6' (1.829 m)     Head Circumference --      Peak Flow --      Pain Score --      Pain Loc --      Pain Edu? --      Excl. in [place]? --     Most recent vital signs: Vitals:   06/03/22 1230 06/03/22 1400  BP: (!) 161/108 (!) 163/113  Pulse: 75 72  Resp: 19 18  Temp:  98.4 F (36.9 C)  SpO2: 100% 100%     General: Awake, no distress.  CV:  Good peripheral perfusion. 2+ radial and 2p pulses BL Resp:  Normal effort.  Abd:  No distention. Soft nontender to palpation Neuro:             Awake, Alert, Oriented x 3  Other:  Aox3, nml speech  PERRL, EOMI, face symmetric, nml tongue movement  5/5 strength in the BL upper and lower extremities  Sensation grossly intact in the BL upper and lower extremities  Finger-nose-finger intact BL    ED Results / Procedures / Treatments  Labs (all labs ordered are listed, but only abnormal results are displayed) Labs Reviewed  CBC - Abnormal; Notable for the following components:      Result Value   RBC 6.04 (*)    Hemoglobin 18.6 (*)    HCT 53.1 (*)    All other components within normal limits  BASIC METABOLIC PANEL  PROTIME-INR  APTT  TROPONIN I (HIGH SENSITIVITY)  TROPONIN I (HIGH SENSITIVITY)     EKG  EKG interpretation performed by myself: NSR, nml axis, nml intervals, inferior q waves, no acute ischemic changes    RADIOLOGY I reviewed and interpreted the CT scan of the brain which does not show any acute intracranial process    PROCEDURES:  Critical Care performed: Yes, see critical care procedure note(s)  .1-3 Lead EKG Interpretation  Performed by: Rada Hay, MD Authorized by: Rada Hay, MD     Interpretation: normal     ECG rate assessment: normal     Rhythm: sinus rhythm     Ectopy: none     Conduction: normal   .Critical Care  Performed by: Rada Hay, MD Authorized by: Rada Hay, MD   Critical care provider statement:     Critical care time (minutes):  30   Critical care was time spent personally by me on the following activities:  Development of treatment plan with patient or surrogate, discussions with consultants, evaluation of patient's response to treatment, examination of patient, ordering and review of laboratory studies, ordering and review of radiographic studies, ordering and performing treatments and interventions, pulse oximetry, re-evaluation of patient's condition and review of old charts   The patient is on the cardiac monitor to evaluate for evidence of arrhythmia and/or significant heart rate changes.   MEDICATIONS  ORDERED IN ED: Medications  nitroGLYCERIN (NITROSTAT) SL tablet 0.4 mg (0.4 mg Sublingual Given 06/03/22 1039)  labetalol (NORMODYNE) injection 10 mg (10 mg Intravenous Given 06/03/22 1039)  iohexol (OMNIPAQUE) 350 MG/ML injection 100 mL (100 mLs Intravenous Contrast Given 06/03/22 1135)  acetaminophen (TYLENOL) tablet 1,000 mg (1,000 mg Oral Given 06/03/22 1258)  morphine (PF) 4 MG/ML injection 4 mg (4 mg Intravenous Given 06/03/22 1258)     IMPRESSION / MDM / ASSESSMENT AND PLAN / ED COURSE  I reviewed the triage vital signs and the nursing notes.                              Patient's presentation is most consistent with acute presentation with potential threat to life or bodily function.  Differential diagnosis includes, but is not limited to, hypertensive emergency, intracranial hemorrhage, subarachnoid hemorrhage, aortic dissection, acute coronary syndrome  Patient is a 41 year old male presenting with chest pain headache blurred vision.  Symptoms started this morning around 6 AM.  He endorses chest pain which is his primary complaint it is tight moving to the left arm and occasionally to the back.  Also complains of headache that started when he woke up around 6 AM today throbbing frontal headache with associated blurry vision without other associated neurologic symptoms no neck  pain.  Has had headaches in the past but this feels different.  On arrival to ED he is markedly hypertensive 230s over 130s rest of his vitals are reassuring.  Overall he looks well he is alert and oriented with normal mental status.  Neurologic exam is nonfocal.  He has equal pulses throughout.  Given his symptomatology I am concerned for hypertensive emergency including aortic dissection and subarachnoid hemorrhage ACS.  Will obtain CTA CT head.  We will give a dose of labetalol.    CTA, CTH and labs are reassuring.  On reassessment patient's chest pain is feeling improved he continues to endorse headache and some blurred vision.  Given his headache started around 6 AM can be fairly reassured that this is unlikely to be subarachnoid with a negative Noncon CT head.  Patient's blood pressure has improved is now in the 180s.  He tells me he typically runs around the 160s.  Do not want to do any further aggressive lowering at this point.  He does still have some blurred vision described as halos around the lights.  Question whether this could be a migraine headache.  However with the elevated blood pressure we will get an MRI brain without to evaluate for any findings to suggest CVA or PRES. as patient's blood pressure is essentially now at his baseline and he is not having any active chest pain with no focal neurologic deficits and normal mental status if MRI is reassuring and I think he can likely be discharged with close outpatient follow-up.  Signed out to oncoming provider disposition pending MRI     FINAL CLINICAL IMPRESSION(S) / ED DIAGNOSES   Final diagnoses:  Hypertension, unspecified type  Chest pain, unspecified type  Nonintractable headache, unspecified chronicity pattern, unspecified headache type     Rx / DC Orders   ED Discharge Orders     None        Note:  This document was prepared using Dragon voice recognition software and may include unintentional dictation errors.    Rada Hay, MD 06/03/22 1435    Rada Hay, MD 06/03/22 1435

## 2022-06-03 NOTE — ED Provider Notes (Signed)
-----------------------------------------   6:35 PM on 06/03/2022 -----------------------------------------  I took over care of this patient from Dr. Starleen Blue.  The MR brain is negative for acute findings.  The patient started to have a headache again and I gave Reglan and Benadryl.  He states that the headache is now completely resolved.  His blood pressure started to increase around this time as well.  I gave a low-dose of labetalol and has now improved.  At this time there is no evidence of hypertensive emergency or any end organ dysfunction.  The patient feels well and is stable for discharge home.  I counseled him on the results of the work-up.  I gave him strict return precautions and he expressed understanding.  I advised him to keep a log of his blood pressures and follow-up with his PMD.   Arta Silence, MD 06/03/22 [phone]

## 2022-06-04 ENCOUNTER — Inpatient Hospital Stay
Admission: EM | Admit: 2022-06-04 | Discharge: 2022-06-06 | DRG: 305 | Disposition: A | Discharge status: Home / Self Care | Payer: Managed Care, Other (non HMO) | Attending: Obstetrics and Gynecology | Admitting: Obstetrics and Gynecology

## 2022-06-04 ENCOUNTER — Encounter: Payer: Self-pay | Admitting: Internal Medicine

## 2022-06-04 ENCOUNTER — Inpatient Hospital Stay: Payer: Managed Care, Other (non HMO)

## 2022-06-04 ENCOUNTER — Other Ambulatory Visit: Payer: Self-pay

## 2022-06-04 ENCOUNTER — Encounter: Payer: Self-pay | Admitting: Family Medicine

## 2022-06-04 ENCOUNTER — Ambulatory Visit (INDEPENDENT_AMBULATORY_CARE_PROVIDER_SITE_OTHER): Payer: Managed Care, Other (non HMO) | Admitting: Family Medicine

## 2022-06-04 VITALS — BP 210/150 | HR 80 | Ht 72.0 in | Wt 220.0 lb

## 2022-06-04 DIAGNOSIS — K219 Gastro-esophageal reflux disease without esophagitis: Secondary | ICD-10-CM | POA: Diagnosis present

## 2022-06-04 DIAGNOSIS — I161 Hypertensive emergency: Principal | ICD-10-CM | POA: Diagnosis present

## 2022-06-04 DIAGNOSIS — E785 Hyperlipidemia, unspecified: Secondary | ICD-10-CM | POA: Diagnosis present

## 2022-06-04 DIAGNOSIS — R109 Unspecified abdominal pain: Secondary | ICD-10-CM | POA: Diagnosis present

## 2022-06-04 DIAGNOSIS — Z8673 Personal history of transient ischemic attack (TIA), and cerebral infarction without residual deficits: Secondary | ICD-10-CM

## 2022-06-04 DIAGNOSIS — Z91018 Allergy to other foods: Secondary | ICD-10-CM | POA: Diagnosis not present

## 2022-06-04 DIAGNOSIS — D72829 Elevated white blood cell count, unspecified: Secondary | ICD-10-CM | POA: Diagnosis present

## 2022-06-04 DIAGNOSIS — F418 Other specified anxiety disorders: Secondary | ICD-10-CM | POA: Diagnosis not present

## 2022-06-04 DIAGNOSIS — G43709 Chronic migraine without aura, not intractable, without status migrainosus: Secondary | ICD-10-CM

## 2022-06-04 DIAGNOSIS — Z720 Tobacco use: Secondary | ICD-10-CM | POA: Diagnosis present

## 2022-06-04 DIAGNOSIS — E039 Hypothyroidism, unspecified: Secondary | ICD-10-CM | POA: Diagnosis present

## 2022-06-04 DIAGNOSIS — G43909 Migraine, unspecified, not intractable, without status migrainosus: Secondary | ICD-10-CM | POA: Diagnosis present

## 2022-06-04 DIAGNOSIS — E663 Overweight: Secondary | ICD-10-CM | POA: Diagnosis present

## 2022-06-04 DIAGNOSIS — G459 Transient cerebral ischemic attack, unspecified: Secondary | ICD-10-CM | POA: Diagnosis present

## 2022-06-04 DIAGNOSIS — F32A Depression, unspecified: Secondary | ICD-10-CM | POA: Diagnosis present

## 2022-06-04 DIAGNOSIS — F4310 Post-traumatic stress disorder, unspecified: Secondary | ICD-10-CM | POA: Diagnosis present

## 2022-06-04 DIAGNOSIS — R197 Diarrhea, unspecified: Secondary | ICD-10-CM | POA: Diagnosis present

## 2022-06-04 DIAGNOSIS — I10 Essential (primary) hypertension: Secondary | ICD-10-CM

## 2022-06-04 DIAGNOSIS — Z888 Allergy status to other drugs, medicaments and biological substances status: Secondary | ICD-10-CM

## 2022-06-04 DIAGNOSIS — Z6829 Body mass index (BMI) 29.0-29.9, adult: Secondary | ICD-10-CM | POA: Diagnosis not present

## 2022-06-04 DIAGNOSIS — H547 Unspecified visual loss: Secondary | ICD-10-CM | POA: Diagnosis not present

## 2022-06-04 DIAGNOSIS — F909 Attention-deficit hyperactivity disorder, unspecified type: Secondary | ICD-10-CM | POA: Diagnosis present

## 2022-06-04 DIAGNOSIS — G4733 Obstructive sleep apnea (adult) (pediatric): Secondary | ICD-10-CM | POA: Diagnosis present

## 2022-06-04 DIAGNOSIS — H543 Unqualified visual loss, both eyes: Secondary | ICD-10-CM | POA: Diagnosis present

## 2022-06-04 DIAGNOSIS — Z79899 Other long term (current) drug therapy: Secondary | ICD-10-CM

## 2022-06-04 DIAGNOSIS — G894 Chronic pain syndrome: Secondary | ICD-10-CM | POA: Diagnosis present

## 2022-06-04 DIAGNOSIS — I160 Hypertensive urgency: Secondary | ICD-10-CM | POA: Diagnosis present

## 2022-06-04 DIAGNOSIS — F17210 Nicotine dependence, cigarettes, uncomplicated: Secondary | ICD-10-CM | POA: Diagnosis present

## 2022-06-04 LAB — PROTIME-INR
INR: 1 (ref 0.8–1.2)
Prothrombin Time: 13.5 seconds (ref 11.4–15.2)

## 2022-06-04 LAB — CBC
HCT: 55.3 % — ABNORMAL HIGH (ref 39.0–52.0)
Hemoglobin: 19.4 g/dL — ABNORMAL HIGH (ref 13.0–17.0)
MCH: 30.6 pg (ref 26.0–34.0)
MCHC: 35.1 g/dL (ref 30.0–36.0)
MCV: 87.4 fL (ref 80.0–100.0)
Platelets: 220 10*3/uL (ref 150–400)
RBC: 6.33 MIL/uL — ABNORMAL HIGH (ref 4.22–5.81)
RDW: 12.1 % (ref 11.5–15.5)
WBC: 11 10*3/uL — ABNORMAL HIGH (ref 4.0–10.5)
nRBC: 0 % (ref 0.0–0.2)

## 2022-06-04 LAB — URINE DRUG SCREEN, QUALITATIVE (ARMC ONLY)
Amphetamines, Ur Screen: NOT DETECTED
Barbiturates, Ur Screen: NOT DETECTED
Benzodiazepine, Ur Scrn: NOT DETECTED
Cannabinoid 50 Ng, Ur ~~LOC~~: POSITIVE — AB
Cocaine Metabolite,Ur ~~LOC~~: NOT DETECTED
MDMA (Ecstasy)Ur Screen: NOT DETECTED
Methadone Scn, Ur: NOT DETECTED
Opiate, Ur Screen: NOT DETECTED
Phencyclidine (PCP) Ur S: NOT DETECTED
Tricyclic, Ur Screen: NOT DETECTED

## 2022-06-04 LAB — BASIC METABOLIC PANEL
Anion gap: 9 (ref 5–15)
BUN: 12 mg/dL (ref 6–20)
CO2: 23 mmol/L (ref 22–32)
Calcium: 10 mg/dL (ref 8.9–10.3)
Chloride: 104 mmol/L (ref 98–111)
Creatinine, Ser: 0.92 mg/dL (ref 0.61–1.24)
GFR, Estimated: >60 mL/min (ref >60)
Glucose, Bld: 92 mg/dL (ref 70–99)
Potassium: 4.2 mmol/L (ref 3.5–5.1)
Sodium: 136 mmol/L (ref 135–145)

## 2022-06-04 LAB — TROPONIN I (HIGH SENSITIVITY): Troponin I (High Sensitivity): 4 ng/L (ref <18)

## 2022-06-04 LAB — APTT: aPTT: 36 seconds (ref 24–36)

## 2022-06-04 MED ORDER — EZETIMIBE 10 MG PO TABS
10.0000 mg | ORAL_TABLET | Freq: Every day | ORAL | Status: DC
  Administered 2022-06-04 – 2022-06-06 (×3): 10 mg via ORAL
  Filled 2022-06-04 (×3): qty 1

## 2022-06-04 MED ORDER — ACETAMINOPHEN 325 MG PO TABS
650.0000 mg | ORAL_TABLET | Freq: Four times a day (QID) | ORAL | Status: DC | PRN
  Administered 2022-06-04 – 2022-06-05 (×3): 650 mg via ORAL
  Filled 2022-06-04 (×3): qty 2

## 2022-06-04 MED ORDER — METHYLPHENIDATE HCL 5 MG PO TABS
5.0000 mg | ORAL_TABLET | Freq: Two times a day (BID) | ORAL | Status: DC
  Administered 2022-06-05 – 2022-06-06 (×3): 5 mg via ORAL
  Filled 2022-06-04 (×3): qty 1

## 2022-06-04 MED ORDER — IOHEXOL 300 MG/ML  SOLN
100.0000 mL | Freq: Once | INTRAMUSCULAR | Status: AC | PRN
  Administered 2022-06-04: 100 mL via INTRAVENOUS

## 2022-06-04 MED ORDER — LIOTHYRONINE SODIUM 5 MCG PO TABS
5.0000 ug | ORAL_TABLET | Freq: Two times a day (BID) | ORAL | Status: DC
  Administered 2022-06-04 – 2022-06-06 (×4): 5 ug via ORAL
  Filled 2022-06-04 (×5): qty 1

## 2022-06-04 MED ORDER — ARIPIPRAZOLE 15 MG PO TABS
15.0000 mg | ORAL_TABLET | Freq: Every day | ORAL | Status: DC
  Administered 2022-06-05: 15 mg via ORAL
  Filled 2022-06-04 (×2): qty 1

## 2022-06-04 MED ORDER — ENOXAPARIN SODIUM 40 MG/0.4ML IJ SOSY
40.0000 mg | PREFILLED_SYRINGE | INTRAMUSCULAR | Status: DC
  Administered 2022-06-04: 40 mg via SUBCUTANEOUS
  Filled 2022-06-04: qty 0.4

## 2022-06-04 MED ORDER — PRAZOSIN HCL 1 MG PO CAPS
1.0000 mg | ORAL_CAPSULE | Freq: Two times a day (BID) | ORAL | Status: DC
  Administered 2022-06-04 – 2022-06-06 (×4): 1 mg via ORAL
  Filled 2022-06-04 (×4): qty 1

## 2022-06-04 MED ORDER — METOCLOPRAMIDE HCL 5 MG/ML IJ SOLN
10.0000 mg | Freq: Once | INTRAMUSCULAR | Status: AC
  Administered 2022-06-04: 10 mg via INTRAVENOUS
  Filled 2022-06-04: qty 2

## 2022-06-04 MED ORDER — DIPHENHYDRAMINE HCL 50 MG/ML IJ SOLN
25.0000 mg | Freq: Once | INTRAMUSCULAR | Status: AC
  Administered 2022-06-04: 25 mg via INTRAVENOUS
  Filled 2022-06-04: qty 1

## 2022-06-04 MED ORDER — CARVEDILOL 25 MG PO TABS
25.0000 mg | ORAL_TABLET | Freq: Two times a day (BID) | ORAL | Status: DC
  Administered 2022-06-04 – 2022-06-06 (×4): 25 mg via ORAL
  Filled 2022-06-04 (×4): qty 1

## 2022-06-04 MED ORDER — NICOTINE 21 MG/24HR TD PT24
21.0000 mg | MEDICATED_PATCH | Freq: Every day | TRANSDERMAL | Status: DC
  Filled 2022-06-04 (×2): qty 1

## 2022-06-04 MED ORDER — AMLODIPINE BESYLATE 10 MG PO TABS
10.0000 mg | ORAL_TABLET | Freq: Every day | ORAL | Status: DC
  Administered 2022-06-04 – 2022-06-06 (×3): 10 mg via ORAL
  Filled 2022-06-04: qty 1
  Filled 2022-06-04: qty 2
  Filled 2022-06-04: qty 1

## 2022-06-04 MED ORDER — ZOLPIDEM TARTRATE 5 MG PO TABS
5.0000 mg | ORAL_TABLET | Freq: Every evening | ORAL | Status: DC | PRN
  Administered 2022-06-04 – 2022-06-05 (×2): 5 mg via ORAL
  Filled 2022-06-04 (×2): qty 1

## 2022-06-04 MED ORDER — BENZTROPINE MESYLATE 1 MG PO TABS: 1.0000 mg | ORAL_TABLET | Freq: Two times a day (BID) | ORAL | Status: DC

## 2022-06-04 MED ORDER — NICARDIPINE HCL IN NACL 20-0.86 MG/200ML-% IV SOLN
3.0000 mg/h | INTRAVENOUS | Status: DC
  Administered 2022-06-04: 5 mg/h via INTRAVENOUS
  Filled 2022-06-04: qty 200

## 2022-06-04 MED ORDER — ONDANSETRON HCL 4 MG/2ML IJ SOLN
4.0000 mg | Freq: Three times a day (TID) | INTRAMUSCULAR | Status: DC | PRN
  Administered 2022-06-05: 4 mg via INTRAVENOUS
  Filled 2022-06-04: qty 2

## 2022-06-04 MED ORDER — BENZTROPINE MESYLATE 1 MG PO TABS
1.0000 mg | ORAL_TABLET | Freq: Every day | ORAL | Status: DC
  Administered 2022-06-04 – 2022-06-05 (×2): 1 mg via ORAL
  Filled 2022-06-04 (×2): qty 1

## 2022-06-04 MED ORDER — HYDRALAZINE HCL 20 MG/ML IJ SOLN
5.0000 mg | INTRAMUSCULAR | Status: DC | PRN
  Administered 2022-06-05: 5 mg via INTRAVENOUS
  Filled 2022-06-04 (×3): qty 1

## 2022-06-04 NOTE — Progress Notes (Signed)
       CROSS COVER NOTE  NAME: Zeke Aker MRN: [id number] DOB : 08-28-1980 ATTENDING PHYSICIAN: Ivor Costa, MD    Date of Service   06/04/2022   HPI/Events of Note   Notified on ongoing headache, Mr Wexler was given reglan+benadryl in ED yesterday with resolution of symptoms. Will trial again tonight.  Interventions   Assessment/Plan:  Reglan and Benadryl Ibuprofen      This document was prepared using Dragon voice recognition software and may include unintentional dictation errors.  Neomia Glass DNP, MBA, FNP-BC Nurse Practitioner [facility] Pager [phone]

## 2022-06-04 NOTE — Assessment & Plan Note (Signed)
Patient with known history of longstanding hypertension presenting for ER follow-up from visit at [facility] yesterday 06/03/2022. While there he had reading 211/107, did report headaches and blurred vision. Imaging including CT head without contrast, CT angio chest, and MR Brain without contrast ordered and were negative. Pressures returned to patient's stated baseline 160/100 range with labetalol. He began to have pressures return to 180s prior to discharge and was advised close follow-up here.   On presentation here today, he states that, while he was able to drive here, he has since noted significantly worsened blurred vision, denies headache, chest pain, shortness of air, paresthesias, abdominal pain, altered mentation, nausea, emesis, change in back pain, or symptoms otherwise.  Blood pressure reading were rechecked and remain elevated from intake. Cardiac exam with +S1, S2, RRR, no additional heart sounds, no JVD, he has symmetric pulses, no carotid bruits, his lung fields are clear without bibasilar rales or abnormalities otherwise. Nondilated eye exam without clear retinal abnormalities noted, he has appropriate EOM and pupillary reflexes in addition to mentation. We reached out to cardiology scheduling and the earliest appointment is in a few days.  Given the significantly elevated BP reading, symptomatology, advised EMS transport to [facility]. I did have an opportunity to talk to MD provider at [facility] to provide pertinent clinical details and handoff. Patient may benefit from inpatient optimization of care with cardiology input once stable. Patient vocalizes clear understanding of the plan and is amenable to EMS transport.

## 2022-06-04 NOTE — ED Triage Notes (Signed)
Pt arrived via [facility]. Pt at [facility] facility as a follow up in the ED yesterday. Pt BP 228/150 at facility so  EMS was called. Most recent BP 160/110 for EMS. Pt complains for chest pain yesterday all day. Todays biggest complaint is blurry vision upon waking up this morning 0600 in both eyes but the L eye is worse then the R eye. Denies headache and any pain.

## 2022-06-04 NOTE — Progress Notes (Signed)
Primary Care / Sports Medicine Office Visit  Patient Information:  Patient ID: Mark Austin, male DOB: 09-Dec-1980 Age: 41 y.o. MRN: [id number]   Jamari Moten is a pleasant 41 y.o. male presenting with the following:  Chief Complaint  Patient presents with   Hypertension    Woke up yesterday morning with headache, had chest tightness, took BP was 205/104, went to ER, did a full work up, all normal. Does have Panic attack while driving, BP been elevated for some time.     Vitals:   06/04/22 1034 06/04/22 1040  BP: (!) 210/140 (!) 210/150  Pulse: 80   SpO2: 97%    Vitals:   06/04/22 1034  Weight: 220 lb (99.8 kg)  Height: 6' (1.829 m)   Body mass index is 29.84 kg/m.  MR BRAIN WO CONTRAST  Result Date: 06/03/2022 CLINICAL DATA:  Neuro deficit, acute, stroke suspected also concern for PRESS EXAM: MRI HEAD WITHOUT CONTRAST TECHNIQUE: Multiplanar, multiecho pulse sequences of the brain and surrounding structures were obtained without intravenous contrast. COMPARISON:  CT head from the same day. FINDINGS: Brain: No acute infarction, hemorrhage, hydrocephalus, extra-axial collection or mass lesion. A few small T2/FLAIR hyperintensities within the white matter, nonspecific but consider within normal limits for patient age. Vascular: Major arterial flow voids are maintained at the skull base. Skull and upper cervical spine: Normal marrow signal. Sinuses/Orbits: Clear sinuses.  No acute orbital findings. Other: No mastoid effusions. IMPRESSION: Normal brain MRI for patient age.  No evidence of acute abnormality. Electronically Signed   By: Margaretha Sheffield M.D.   On: 06/03/2022 15:35   CT Angio Chest/Abd/Pel for Dissection W and/or Wo Contrast  Result Date: 06/03/2022 CLINICAL DATA:  Aortic dissection.  History hypertension EXAM: CT ANGIOGRAPHY CHEST, ABDOMEN AND PELVIS TECHNIQUE: Non-contrast CT of the chest was initially obtained. Multidetector CT imaging through the chest, abdomen and  pelvis was performed using the standard protocol during bolus administration of intravenous contrast. Multiplanar reconstructed images and MIPs were obtained and reviewed to evaluate the vascular anatomy. RADIATION DOSE REDUCTION: This exam was performed according to the departmental dose-optimization program which includes automated exposure control, adjustment of the mA and/or kV according to patient size and/or use of iterative reconstruction technique. CONTRAST:  110m OMNIPAQUE IOHEXOL 350 MG/ML SOLN IV COMPARISON:  CT abdomen and pelvis 03/03/2022 FINDINGS: CTA CHEST FINDINGS Cardiovascular: Precontrast images show no intramural hematoma or para-aortic infiltration. Aorta normal caliber without aneurysm or dissection. Heart size subjectively normal. No pericardial effusion. Pulmonary arteries patent. Mediastinum/Nodes: Esophagus unremarkable. No thoracic adenopathy. Base of cervical region normal appearance. Lungs/Pleura: Lungs clear. No pulmonary infiltrate, pleural effusion, pneumothorax or mass. Musculoskeletal: Unremarkable Review of the MIP images confirms the above findings. CTA ABDOMEN AND PELVIS FINDINGS VASCULAR Aorta: Normal caliber with scattered atherosclerotic plaques. No aneurysmal dilatation or dissection. Celiac: Widely patent SMA: Widely patent Renals: Widely patent single BILATERAL renal arteries IMA: Patent Inflow: Normal appearance, patent Veins: Unopacified at time of imaging Review of the MIP images confirms the above findings. NON-VASCULAR Hepatobiliary: 11 mm LEFT lobe hepatic cyst. Liver otherwise normal appearance. Gallbladder surgically absent. Pancreas: Normal appearance Spleen: Normal appearance Adrenals/Urinary Tract: Adrenal glands, kidneys, ureters, and bladder normal appearance Stomach/Bowel: Normal appendix. Mild distal colonic diverticulosis without evidence of diverticulitis. Sigmoid anastomotic staple line noted. Stomach and remaining bowel loops unremarkable. Lymphatic: No  adenopathy Reproductive: Unremarkable prostate gland and seminal vesicles Other: RIGHT inguinal hernia containing fat. No free air or free fluid. No inflammatory process. Musculoskeletal: Interspinous  prosthesis L4-L5. No acute osseous findings. Review of the MIP images confirms the above findings. IMPRESSION: No evidence of aortic aneurysm or dissection. Mild distal colonic diverticulosis without evidence of diverticulitis. RIGHT inguinal hernia containing fat. No acute intrathoracic, intra-abdominal, or intrapelvic abnormalities. Electronically Signed   By: Lavonia Dana M.D.   On: 06/03/2022 11:48   CT HEAD WO CONTRAST (5MM)  Result Date: 06/03/2022 CLINICAL DATA:  Provided history: Neuro deficit, acute, stroke suspected. EXAM: CT HEAD WITHOUT CONTRAST TECHNIQUE: Contiguous axial images were obtained from the base of the skull through the vertex without intravenous contrast. RADIATION DOSE REDUCTION: This exam was performed according to the departmental dose-optimization program which includes automated exposure control, adjustment of the mA and/or kV according to patient size and/or use of iterative reconstruction technique. COMPARISON:  No pertinent prior exams available for comparison. FINDINGS: Brain: No age advanced or lobar predominant parenchymal atrophy. Partially empty sella turcica, a nonspecific finding. There is no acute intracranial hemorrhage. No demarcated cortical infarct. No extra-axial fluid collection. No evidence of an intracranial mass. No midline shift. Vascular: No hyperdense vessel. Skull: No fracture or aggressive osseous lesion. Sinuses/Orbits: No mass or acute finding within the imaged orbits. No significant paranasal sinus disease at the imaged levels. IMPRESSION: No evidence of acute intracranial abnormality. Electronically Signed   By: Kellie Simmering D.O.   On: 06/03/2022 11:47   DG Chest 2 View  Result Date: 06/03/2022 CLINICAL DATA:  Chest pain. EXAM: CHEST - 2 VIEW COMPARISON:   April 07, 2021. FINDINGS: The heart size and mediastinal contours are within normal limits. Both lungs are clear. The visualized skeletal structures are unremarkable. IMPRESSION: No active cardiopulmonary disease. Electronically Signed   By: Marijo Conception M.D.   On: 06/03/2022 11:29     Independent interpretation of notes and tests performed by another provider:   None  Procedures performed:   None  Pertinent History, Exam, Impression, and Recommendations:   Problem List Items Addressed This Visit       Cardiovascular and Mediastinum   Hypertension - Primary    Patient with known history of longstanding hypertension presenting for ER follow-up from visit at [facility] yesterday 06/03/2022. While there he had reading 211/107, did report headaches and blurred vision. Imaging including CT head without contrast, CT angio chest, and MR Brain without contrast ordered and were negative. Pressures returned to patient's stated baseline 160/100 range with labetalol. He began to have pressures return to 180s prior to discharge and was advised close follow-up here.   On presentation here today, he states that, while he was able to drive here, he has since noted significantly worsened blurred vision, denies headache, chest pain, shortness of air, paresthesias, abdominal pain, altered mentation, nausea, emesis, change in back pain, or symptoms otherwise.  Blood pressure reading were rechecked and remain elevated from intake. Cardiac exam with +S1, S2, RRR, no additional heart sounds, no JVD, he has symmetric pulses, no carotid bruits, his lung fields are clear without bibasilar rales or abnormalities otherwise. Nondilated eye exam without clear retinal abnormalities noted, he has appropriate EOM and pupillary reflexes in addition to mentation. We reached out to cardiology scheduling and the earliest appointment is in a few days.  Given the significantly elevated BP reading, symptomatology, advised EMS  transport to [facility]. I did have an opportunity to talk to MD provider at [facility] to provide pertinent clinical details and handoff. Patient may benefit from inpatient optimization of care with cardiology input once stable. Patient vocalizes clear  understanding of the plan and is amenable to EMS transport.      Relevant Orders   Ambulatory referral to Cardiology     Orders & Medications No orders of the defined types were placed in this encounter.  Orders Placed This Encounter  Procedures   Ambulatory referral to Cardiology     No follow-ups on file.     [name], MD   Primary Care Sports Medicine [place]

## 2022-06-04 NOTE — ED Provider Notes (Addendum)
[facility] Provider Note    Event Date/Time   First MD Initiated Contact with Patient 06/04/22 1210     (approximate)   History   Chest Pain   HPI  Mark Austin is a 41 y.o. male with past medical history significant for hypertension, tobacco use, hyperlipidemia, who presents to the emergency department with change in vision and high blood pressure.  Patient was evaluated yesterday in the emergency department for headache, chest pain and elevated blood pressure.  Patient had an MRI at that time and was ultimately discharged home after his symptoms improved.  States that he went to follow-up with his primary care physician this morning however his vision has worsened.  States he normally has 20/15 vision with no use of contacts or glasses.  States that he has blurred vision to both eyes.  Denies any headache or confusion.  Denies any chest pain or shortness of breath.  No nausea or vomiting.  No recent falls or trauma.  Has not missed any of his home antihypertensive medications.  States that his vision is significantly worse when compared to yesterday.  States that when he woke up this morning it was worse.     Physical Exam   Triage Vital Signs: ED Triage Vitals  Enc Vitals Group     BP 06/04/22 1201 (!) 193/121     Pulse Rate 06/04/22 1201 68     Resp 06/04/22 1201 12     Temp --      Temp src --      SpO2 06/04/22 1201 96 %     Weight 06/04/22 1202 220 lb 0.3 oz (99.8 kg)     Height 06/04/22 1202 6' (1.829 m)     Head Circumference --      Peak Flow --      Pain Score 06/04/22 1202 0     Pain Loc --      Pain Edu? --      Excl. in [place]? --     Most recent vital signs: Vitals:   06/04/22 1221 06/04/22 1230  BP: (!) 165/117 (!) 185/121  Pulse: 70 70  Resp: 12 (!) 21  SpO2: 91% (!) 89%    Physical Exam Constitutional:      Appearance: He is well-developed.  HENT:     Head: Atraumatic.  Eyes:     Extraocular Movements: Extraocular  movements intact.     Conjunctiva/sclera: Conjunctivae normal.     Pupils: Pupils are equal, round, and reactive to light.     Comments: Visual acuity 20/50 R eye 20/50 L eye 20/70   Cardiovascular:     Rate and Rhythm: Regular rhythm.     Heart sounds: No murmur heard. Pulmonary:     Effort: No respiratory distress.  Musculoskeletal:     Cervical back: Normal range of motion.  Skin:    General: Skin is warm.  Neurological:     Mental Status: He is alert. Mental status is at baseline.          IMPRESSION / MDM / ASSESSMENT AND PLAN / ED COURSE  I reviewed the triage vital signs and the nursing notes.  Differential diagnosis including hypertensive emergency, electrolyte abnormality, ACS, CVA.  Low suspicion for press syndrome given that the patient has no altered mental status.  On chart review of outside records/recent ED visit patient had an MRI done of his brain that showed no acute infarcts or acute hemorrhage.  Patient was  treated with migraine cocktail and ultimately discharged home.  Low suspicion for dissection or pulmonary embolism as the cause of his elevated blood pressure.  Clinical picture given his bilateral vision changes is not consistent with an acute angle glaucoma or giant cell arteritis.  No jaw claudication and patient is otherwise has a young age.  Initial blood pressure significantly elevated 210/150.  Repeat blood pressure in the room 185/121.  EKG  EKG showed normal sinus rhythm.  V3 with wandering line.  No significant ST elevation or depression.  No signs of acute ischemia or dysrhythmia.  No tachycardic or bradycardic dysrhythmias while cardiac telemetry in the emergency department.  ED Results / Procedures / Treatments   Labs (all labs ordered are listed, but only abnormal results are displayed) Labs interpreted as -  Creatinine is at his baseline.  No significant electrolyte abnormalities.  Significantly elevated hemoglobin at 19 however  this does appear to be his baseline.  Patient does have tobacco use on a daily basis.  Counseled on smoking cessation.  Troponin is negative.  Labs Reviewed  CBC - Abnormal; Notable for the following components:      Result Value   WBC 11.0 (*)    RBC 6.33 (*)    Hemoglobin 19.4 (*)    HCT 55.3 (*)    All other components within normal limits  BASIC METABOLIC PANEL  PROTIME-INR  TROPONIN I (HIGH SENSITIVITY)     Patient with decreased visual acuity from his baseline.  Patient started on Cardene infusion for hypertensive emergency given concern of endorgan damage with change in vision.  Consulted the hospitalist for admission.  After discussion with the hospitalist Dr. Donna Bernard, wanted to reach out to ophthalmology.  Consulted ophthalmology Dr. Lazarus Salines and discussed the patient's case.  He felt that it was most likely secondary to his uncontrolled hypertension.  Recommended admission to the hospitalist for blood pressure management.  At time of discharge recommends following up in ophthalmology clinic so they can evaluate for optic edema which can persist despite improvement of blood pressure.  PROCEDURES:  Critical Care performed: Yes  .Critical Care  Performed by: Nathaniel Man, MD Authorized by: Nathaniel Man, MD   Critical care provider statement:    Critical care time (minutes):  40   Critical care time was exclusive of:  Separately billable procedures and treating other patients   Critical care was necessary to treat or prevent imminent or life-threatening deterioration of the following conditions:  Cardiac failure   Critical care was time spent personally by me on the following activities:  Development of treatment plan with patient or surrogate, discussions with consultants, evaluation of patient's response to treatment, examination of patient, ordering and review of laboratory studies, ordering and review of radiographic studies, ordering and performing treatments and  interventions, pulse oximetry, re-evaluation of patient's condition and review of old charts   Patient's presentation is most consistent with acute presentation with potential threat to life or bodily function.   MEDICATIONS ORDERED IN ED: Medications  nicardipine (CARDENE) '20mg'$  in 0.86% saline 234m IV infusion (0.1 mg/ml) (has no administration in time range)    FINAL CLINICAL IMPRESSION(S) / ED DIAGNOSES   Final diagnoses:  Hypertensive emergency     Rx / DC Orders   ED Discharge Orders     None        Note:  This document was prepared using Dragon voice recognition software and may include unintentional dictation errors.   MNathaniel Man MD 06/04/22 1321  Nathaniel Man, MD 06/04/22 1347

## 2022-06-04 NOTE — Patient Instructions (Signed)
-   Proceed via EMS to [facility]

## 2022-06-04 NOTE — ED Notes (Signed)
Pt visual acuity from distance: R eye 20/50 L eye 20/70 Both eyes 20/50

## 2022-06-04 NOTE — H&P (Signed)
History and Physical    Mark Austin YHC:[id number] DOB: 30-Jul-1980 DOA: 06/04/2022  Referring MD/NP/PA:   PCP: [name], MD   Patient coming from:  The patient is coming from home.  At baseline, pt is independent for most of ADL.        Chief Complaint: Blurry vision and vision loss  HPI: Mark Austin is a 41 y.o. male with medical history significant of hypertension, hyperlipidemia, TIA, hypothyroidism, depression with anxiety, PTSD, diverticulitis, chronic pain syndrome, chronic back pain, tobacco abuse, who presents with blurry vision and vision loss.  Patient was seen in the ED yesterday due to chest pain and elevated blood pressure with SBP 200s. Pt also had blurry vision.  Patient had a negative MRI of brain and negative CTA of chest/abdomen/pelvis for dissection. His trop was negative at 3. Pt was discharged home in stable condition.  He states that his blurry vision has worsened since this morning.  He has vision loss in both eyes, cannot drive normally.  Patient denies chest pain, cough, shortness of breath.  No unilateral numbness or tinglings in extremities.  No facial droop or slurred speech.  Patient has diarrhea since yesterday, totally 6 times of watery diarrhea.  He does not have nausea or vomiting.  No fever or chills.  He has right lower quadrant abdominal pain, which is mild, aching, nonradiating.  Patient states that this abdominal pain is new.  He did not have abdominal pain yesterday.  Denies symptoms of UTI.  Pt is found to have elevated blood pressure 228/132, and Cardene drip was started in ED.  Data reviewed independently and ED Course: pt was found to have WBC 11.0, INR 1.0, troponin level 3 yesterday--> 4, GFR> 60, temperature normal, heart rate 102, 70, RR 21, oxygen saturation 89% initially, which improved to 91-96% on room air.  Patient had negative chest x-ray yesterday.  Patient is admitted to PCU as inpatient   EKG: I have personally reviewed.   Sinus rhythm, QTc 413, Q waves in inferior leads.   Review of Systems:   General: no fevers, chills, no body weight gain, has fatigue HEENT: no blurry vision, hearing changes or sore throat Respiratory: no dyspnea, coughing, wheezing CV: no chest pain, no palpitations GI: no nausea, vomiting, abdominal pain, diarrhea, constipation GU: no dysuria, burning on urination, increased urinary frequency, hematuria  Ext: no leg edema Neuro: no unilateral weakness, numbness, or tingling, hearing loss. Has vision loss and blurry vision Skin: no rash, no skin tear. MSK: No muscle spasm, no deformity, no limitation of range of movement in spin Heme: No easy bruising.  Travel history: No recent long distant travel.   Allergy:  Allergies  Allergen Reactions   Ketorolac Tromethamine Anaphylaxis    Edema of the tongue, Edema, Swelling, Seizure   Lidocaine Other (See Comments) and Anaphylaxis    Intolerant to lidocaine patches: Seizure   Tramadol Other (See Comments)    Seizures    Atorvastatin Other (See Comments)    Muscle aches   Brassica Oleracea Hives    "Cauliflower"   Carisoprodol Other (See Comments)    Sedation   Gabapentin Other (See Comments) and Palpitations    "Feels like skin is wanting to be removed from body"   Rosuvastatin Other (See Comments)    Muscle aches    Past Medical History:  Diagnosis Date   Anxiety    Back ache    Depression    Diverticulitis    GERD (gastroesophageal  reflux disease)    History of kidney stones    Hypertension    Inguinal hernia    PTSD (post-traumatic stress disorder)    Seizure ([place])    over 20 yrs ago.  related to medications    Past Surgical History:  Procedure Laterality Date   back surgeries     x5 lumbar L4L5   BACK SURGERY     BOWEL RESECTION  2016   CHOLECYSTECTOMY  2017   COLON SURGERY     COLONOSCOPY     COLONOSCOPY WITH PROPOFOL N/A 03/19/2021   Procedure: COLONOSCOPY WITH BIOPSY;  Surgeon: Lucilla Lame, MD;   Location: [place];  Service: Endoscopy;  Laterality: N/A;   EXCISION MASS LOWER EXTREMETIES Left 11/30/2019   Procedure: EXCISION MASS LOWER EXTREMETIES, Left Hip;  Surgeon: Jules Husbands, MD;  Location: [facility];  Service: General;  Laterality: Left;   HERNIA REPAIR     INGUINAL HERNIA REPAIR Left    POLYPECTOMY N/A 03/19/2021   Procedure: POLYPECTOMY;  Surgeon: Lucilla Lame, MD;  Location: [place];  Service: Endoscopy;  Laterality: N/A;   SHOULDER SURGERY Right    rotator cuff tear   XI ROBOTIC ASSISTED INGUINAL HERNIA REPAIR WITH MESH Right 10/08/2019   Procedure: XI ROBOTIC ASSISTED INGUINAL HERNIA REPAIR WITH MESH;  Surgeon: Jules Husbands, MD;  Location: [facility];  Service: General;  Laterality: Right;    Social History:  reports that he has been smoking cigarettes. He has been smoking an average of 1 pack per day. He has quit using smokeless tobacco. He reports that he does not currently use alcohol. He reports current drug use. Frequency: 2.00 times per week. Drug: Other-see comments.  Family History:  Family History  Problem Relation Age of Onset   Healthy Mother    Healthy Father      Prior to Admission medications   Medication Sig Start Date End Date Taking? Authorizing Provider  anastrozole (ARIMIDEX) 1 MG tablet Take 1 mg by mouth once a week. 05/08/21   [provider]  ARIPiprazole (ABILIFY) 15 MG tablet Take 15 mg by mouth at bedtime. 02/06/22   [provider]  benztropine (COGENTIN) 1 MG tablet Take 1 mg by mouth 2 (two) times daily. 09/13/21   [provider]  carvedilol (COREG) 25 MG tablet Take 25 mg by mouth 2 (two) times daily. 09/13/21   [provider]  ezetimibe (ZETIA) 10 MG tablet Take 10 mg by mouth daily. 05/25/22   [provider]  ibuprofen (ADVIL) 200 MG tablet Take 400 mg by mouth every 6 (six) hours as needed for mild pain.    [provider]  liothyronine (CYTOMEL) 5 MCG tablet  Take 5 mcg by mouth 2 (two) times daily. 04/28/22   [provider]  methylphenidate (RITALIN) 5 MG tablet Take 5 mg by mouth 2 (two) times daily with breakfast and lunch. 05/17/22   [provider]  prazosin (MINIPRESS) 1 MG capsule TAKE 1 CAPSULE(1 MG) BY MOUTH TWICE DAILY Patient taking differently: Take 1 mg by mouth 2 (two) times daily. 07/28/21   [name], MD  propranolol ER (INDERAL LA) 160 MG SR capsule Take 160 mg by mouth daily. Patient not taking: Reported on 06/04/2022 05/25/22   [provider]  tadalafil (CIALIS) 20 MG tablet Take 20 mg by mouth daily as needed for erectile dysfunction.    [provider]  testosterone cypionate (DEPOTESTOTERONE CYPIONATE) 100 MG/ML injection Inject 200 mg  into the muscle every 7 (seven) days. For IM use only  Thursday's    [provider]  zaleplon (SONATA) 5 MG capsule Take 5 mg by mouth at bedtime. May take another in the middle of the night as needed. 02/24/22   [provider]    Physical Exam: Vitals:   06/04/22 1315 06/04/22 1330 06/04/22 1400 06/04/22 1430  BP: (!) 163/112 (!) 175/109 (!) 212/117 (!) 191/116  Pulse: 69 76 72 72  Resp: '18 20 20 19  '$ SpO2: 96% 93% 94% 93%  Weight:      Height:       General: Not in acute distress HEENT:       Eyes: PERRL, EOMI, no scleral icterus.       ENT: No discharge from the ears and nose, no pharynx injection, no tonsillar enlargement.        Neck: No JVD, no bruit, no mass felt. Heme: No neck lymph node enlargement. Cardiac: S1/S2, RRR, No murmurs, No gallops or rubs. Respiratory: No rales, wheezing, rhonchi or rubs. GI: Soft, nondistended, nontender, no rebound pain, no organomegaly, BS present. GU: No hematuria Ext: No pitting leg edema bilaterally. 1+DP/PT pulse bilaterally. Musculoskeletal: No joint deformities, No joint redness or warmth, no limitation of ROM in spin. Skin: No rashes.  Neuro: Alert, oriented X3, cranial  nerves III-XII grossly intact, moves all extremities normally. Muscle strength 5/5 in all extremities, sensation to light touch intact.  Psych: Patient is not psychotic, no suicidal or hemocidal ideation.  Labs on Admission: I have personally reviewed following labs and imaging studies  CBC: Recent Labs  Lab 06/03/22 1031 06/04/22 1225  WBC 7.5 11.0*  HGB 18.6* 19.4*  HCT 53.1* 55.3*  MCV 87.9 87.4  PLT 198 580   Basic Metabolic Panel: Recent Labs  Lab 06/03/22 1031 06/04/22 1225  NA 137 136  K 4.0 4.2  CL 104 104  CO2 25 23  GLUCOSE 91 92  BUN 10 12  CREATININE 0.98 0.92  CALCIUM 9.4 10.0   GFR: Estimated Creatinine Clearance: 129.3 mL/min (by C-G formula based on SCr of 0.92 mg/dL). Liver Function Tests: No results for input(s): "AST", "ALT", "ALKPHOS", "BILITOT", "PROT", "ALBUMIN" in the last 168 hours. No results for input(s): "LIPASE", "AMYLASE" in the last 168 hours. No results for input(s): "AMMONIA" in the last 168 hours. Coagulation Profile: Recent Labs  Lab 06/03/22 1041 06/04/22 1225  INR 1.0 1.0   Cardiac Enzymes: No results for input(s): "CKTOTAL", "CKMB", "CKMBINDEX", "TROPONINI" in the last 168 hours. BNP (last 3 results) No results for input(s): "PROBNP" in the last 8760 hours. HbA1C: No results for input(s): "HGBA1C" in the last 72 hours. CBG: No results for input(s): "GLUCAP" in the last 168 hours. Lipid Profile: No results for input(s): "CHOL", "HDL", "LDLCALC", "TRIG", "CHOLHDL", "LDLDIRECT" in the last 72 hours. Thyroid Function Tests: No results for input(s): "TSH", "T4TOTAL", "FREET4", "T3FREE", "THYROIDAB" in the last 72 hours. Anemia Panel: No results for input(s): "VITAMINB12", "FOLATE", "FERRITIN", "TIBC", "IRON", "RETICCTPCT" in the last 72 hours. Urine analysis:    Component Value Date/Time   COLORURINE YELLOW (A) 03/03/2022 1811   APPEARANCEUR HAZY (A) 03/03/2022 1811   LABSPEC 1.029 03/03/2022 1811   PHURINE 5.0 03/03/2022  1811   GLUCOSEU NEGATIVE 03/03/2022 1811   HGBUR NEGATIVE 03/03/2022 1811   BILIRUBINUR NEGATIVE 03/03/2022 1811   KETONESUR 20 (A) 03/03/2022 1811   PROTEINUR NEGATIVE 03/03/2022 1811   NITRITE NEGATIVE 03/03/2022 1811   LEUKOCYTESUR NEGATIVE 03/03/2022 1811  Sepsis Labs: '@LABRCNTIP'$ (procalcitonin:4,lacticidven:4) )No results found for this or any previous visit (from the past 240 hour(s)).   Radiological Exams on Admission: MR BRAIN WO CONTRAST  Result Date: 06/03/2022 CLINICAL DATA:  Neuro deficit, acute, stroke suspected also concern for PRESS EXAM: MRI HEAD WITHOUT CONTRAST TECHNIQUE: Multiplanar, multiecho pulse sequences of the brain and surrounding structures were obtained without intravenous contrast. COMPARISON:  CT head from the same day. FINDINGS: Brain: No acute infarction, hemorrhage, hydrocephalus, extra-axial collection or mass lesion. A few small T2/FLAIR hyperintensities within the white matter, nonspecific but consider within normal limits for patient age. Vascular: Major arterial flow voids are maintained at the skull base. Skull and upper cervical spine: Normal marrow signal. Sinuses/Orbits: Clear sinuses.  No acute orbital findings. Other: No mastoid effusions. IMPRESSION: Normal brain MRI for patient age.  No evidence of acute abnormality. Electronically Signed   By: Margaretha Sheffield M.D.   On: 06/03/2022 15:35   CT Angio Chest/Abd/Pel for Dissection W and/or Wo Contrast  Result Date: 06/03/2022 CLINICAL DATA:  Aortic dissection.  History hypertension EXAM: CT ANGIOGRAPHY CHEST, ABDOMEN AND PELVIS TECHNIQUE: Non-contrast CT of the chest was initially obtained. Multidetector CT imaging through the chest, abdomen and pelvis was performed using the standard protocol during bolus administration of intravenous contrast. Multiplanar reconstructed images and MIPs were obtained and reviewed to evaluate the vascular anatomy. RADIATION DOSE REDUCTION: This exam was performed  according to the departmental dose-optimization program which includes automated exposure control, adjustment of the mA and/or kV according to patient size and/or use of iterative reconstruction technique. CONTRAST:  132m OMNIPAQUE IOHEXOL 350 MG/ML SOLN IV COMPARISON:  CT abdomen and pelvis 03/03/2022 FINDINGS: CTA CHEST FINDINGS Cardiovascular: Precontrast images show no intramural hematoma or para-aortic infiltration. Aorta normal caliber without aneurysm or dissection. Heart size subjectively normal. No pericardial effusion. Pulmonary arteries patent. Mediastinum/Nodes: Esophagus unremarkable. No thoracic adenopathy. Base of cervical region normal appearance. Lungs/Pleura: Lungs clear. No pulmonary infiltrate, pleural effusion, pneumothorax or mass. Musculoskeletal: Unremarkable Review of the MIP images confirms the above findings. CTA ABDOMEN AND PELVIS FINDINGS VASCULAR Aorta: Normal caliber with scattered atherosclerotic plaques. No aneurysmal dilatation or dissection. Celiac: Widely patent SMA: Widely patent Renals: Widely patent single BILATERAL renal arteries IMA: Patent Inflow: Normal appearance, patent Veins: Unopacified at time of imaging Review of the MIP images confirms the above findings. NON-VASCULAR Hepatobiliary: 11 mm LEFT lobe hepatic cyst. Liver otherwise normal appearance. Gallbladder surgically absent. Pancreas: Normal appearance Spleen: Normal appearance Adrenals/Urinary Tract: Adrenal glands, kidneys, ureters, and bladder normal appearance Stomach/Bowel: Normal appendix. Mild distal colonic diverticulosis without evidence of diverticulitis. Sigmoid anastomotic staple line noted. Stomach and remaining bowel loops unremarkable. Lymphatic: No adenopathy Reproductive: Unremarkable prostate gland and seminal vesicles Other: RIGHT inguinal hernia containing fat. No free air or free fluid. No inflammatory process. Musculoskeletal: Interspinous prosthesis L4-L5. No acute osseous findings. Review  of the MIP images confirms the above findings. IMPRESSION: No evidence of aortic aneurysm or dissection. Mild distal colonic diverticulosis without evidence of diverticulitis. RIGHT inguinal hernia containing fat. No acute intrathoracic, intra-abdominal, or intrapelvic abnormalities. Electronically Signed   By: MLavonia DanaM.D.   On: 06/03/2022 11:48   CT HEAD WO CONTRAST (5MM)  Result Date: 06/03/2022 CLINICAL DATA:  Provided history: Neuro deficit, acute, stroke suspected. EXAM: CT HEAD WITHOUT CONTRAST TECHNIQUE: Contiguous axial images were obtained from the base of the skull through the vertex without intravenous contrast. RADIATION DOSE REDUCTION: This exam was performed according to the departmental dose-optimization program which includes automated  exposure control, adjustment of the mA and/or kV according to patient size and/or use of iterative reconstruction technique. COMPARISON:  No pertinent prior exams available for comparison. FINDINGS: Brain: No age advanced or lobar predominant parenchymal atrophy. Partially empty sella turcica, a nonspecific finding. There is no acute intracranial hemorrhage. No demarcated cortical infarct. No extra-axial fluid collection. No evidence of an intracranial mass. No midline shift. Vascular: No hyperdense vessel. Skull: No fracture or aggressive osseous lesion. Sinuses/Orbits: No mass or acute finding within the imaged orbits. No significant paranasal sinus disease at the imaged levels. IMPRESSION: No evidence of acute intracranial abnormality. Electronically Signed   By: Kellie Simmering D.O.   On: 06/03/2022 11:47   DG Chest 2 View  Result Date: 06/03/2022 CLINICAL DATA:  Chest pain. EXAM: CHEST - 2 VIEW COMPARISON:  April 07, 2021. FINDINGS: The heart size and mediastinal contours are within normal limits. Both lungs are clear. The visualized skeletal structures are unremarkable. IMPRESSION: No active cardiopulmonary disease. Electronically Signed   By: Marijo Conception M.D.   On: 06/03/2022 11:29      Assessment/Plan Principal Problem:   Hypertensive emergency Active Problems:   Vision loss   HLD (hyperlipidemia)   TIA (transient ischemic attack)   Hypothyroidism   Leukocytosis   PTSD (post-traumatic stress disorder)   Depression with anxiety   Tobacco abuse   Diarrhea   Abdominal pain   Overweight   Assessment and Plan:  Hypertensive emergency:  Bp is 228/132. Pt is taking Coreg 25 mg twice daily and prazosin 5 mg twice daily which is for nightmare. Pt has blurry vision and vision loss.  MRI of brain was negative yesterday.  -will admit to PCU as inpt -continue home Coreg and prazosin -Start amlodipine 10 mg daily -Continue Cardene drip, targeting decrease SBP by 25 to 30% in the first 6 hours  Vision loss: Likely due to hypertensive emergency. EDP consulted ophthalmology Dr. Lazarus Salines and discussed the patient's case.  "He felt that it was most likely secondary to his uncontrolled hypertension.  Recommended admission to the hospitalist for blood pressure management.  At time of discharge recommends following up in ophthalmology clinic so they can evaluate for optic edema which can persist despite improvement of blood pressure". Since pt has bilateral vision loss and bilateral blurry vision, I have low suspicions for retinal artery or vein occlusion.  -Will treat hypertensive emergency now as above -If no improvement,  may consider MRI of orbit  HLD (hyperlipidemia) -Zetia  Hx of TIA (transient ischemic attack) -Zetia  Hypothyroidism: -continue home Liothyroine  Leukocytosis: mild. WBC 11.0, no source of infection identified -Follow-up with CBC  PTSD (post-traumatic stress disorder), depression with anxiety: -Continue home Abilify, Cogentin, Ritalin  Tobacco abuse -Nicotine patch  Abdominal pain: patient has right lower quadrant abdominal pain -Check lipase and LFT -CT-abd/pelvis  Diarrhea:  -check C. difficile and  GI pathogen panel  Overweight: BMI= 29.84  and BW= 99.8 -Diet and exercise.   -Encourage to lose weight.       DVT ppx:  SQ Lovenox  Code Status: Full code  Family Communication: I offered to call his family, but patient states he will call his wife by himself, he does not want me to call his wife.  Disposition Plan:  Anticipate discharge back to previous environment  Consults called: Dr.Hampton of  ophthalmology  Admission status and Level of care: Progressive:   as inpt   Dispo: The patient is from: Home  Anticipated d/c is to: Home              Anticipated d/c date is: 2 days              Patient currently is not medically stable to d/c.    Severity of Illness:  The appropriate patient status for this patient is INPATIENT. Inpatient status is judged to be reasonable and necessary in order to provide the required intensity of service to ensure the patient's safety. The patient's presenting symptoms, physical exam findings, and initial radiographic and laboratory data in the context of their chronic comorbidities is felt to place them at high risk for further clinical deterioration. Furthermore, it is not anticipated that the patient will be medically stable for discharge from the hospital within 2 midnights of admission.   * I certify that at the point of admission it is my clinical judgment that the patient will require inpatient hospital care spanning beyond 2 midnights from the point of admission due to high intensity of service, high risk for further deterioration and high frequency of surveillance required.*       Date of Service 06/04/2022    Ivor Costa [facility]   If 7PM-7AM, please contact night-coverage [email or web address] 06/04/2022, 3:04 PM

## 2022-06-05 ENCOUNTER — Inpatient Hospital Stay: Payer: Managed Care, Other (non HMO)

## 2022-06-05 DIAGNOSIS — I161 Hypertensive emergency: Secondary | ICD-10-CM | POA: Diagnosis not present

## 2022-06-05 LAB — LIPASE, BLOOD: Lipase: 44 U/L (ref 11–51)

## 2022-06-05 LAB — BASIC METABOLIC PANEL
Anion gap: 7 (ref 5–15)
BUN: 16 mg/dL (ref 6–20)
CO2: 22 mmol/L (ref 22–32)
Calcium: 9.5 mg/dL (ref 8.9–10.3)
Chloride: 104 mmol/L (ref 98–111)
Creatinine, Ser: 0.85 mg/dL (ref 0.61–1.24)
GFR, Estimated: >60 mL/min (ref >60)
Glucose, Bld: 111 mg/dL — ABNORMAL HIGH (ref 70–99)
Potassium: 3.7 mmol/L (ref 3.5–5.1)
Sodium: 133 mmol/L — ABNORMAL LOW (ref 135–145)

## 2022-06-05 LAB — HEPATIC FUNCTION PANEL
ALT: 20 U/L (ref 0–44)
AST: 25 U/L (ref 15–41)
Albumin: 4.5 g/dL (ref 3.5–5.0)
Alkaline Phosphatase: 55 U/L (ref 38–126)
Bilirubin, Direct: 0.2 mg/dL (ref 0.0–0.2)
Indirect Bilirubin: 1.3 mg/dL — ABNORMAL HIGH (ref 0.3–0.9)
Total Bilirubin: 1.5 mg/dL — ABNORMAL HIGH (ref 0.3–1.2)
Total Protein: 8 g/dL (ref 6.5–8.1)

## 2022-06-05 LAB — CBC
HCT: 53.6 % — ABNORMAL HIGH (ref 39.0–52.0)
Hemoglobin: 18.6 g/dL — ABNORMAL HIGH (ref 13.0–17.0)
MCH: 30.2 pg (ref 26.0–34.0)
MCHC: 34.7 g/dL (ref 30.0–36.0)
MCV: 87 fL (ref 80.0–100.0)
Platelets: 199 10*3/uL (ref 150–400)
RBC: 6.16 MIL/uL — ABNORMAL HIGH (ref 4.22–5.81)
RDW: 12.4 % (ref 11.5–15.5)
WBC: 8.3 10*3/uL (ref 4.0–10.5)
nRBC: 0 % (ref 0.0–0.2)

## 2022-06-05 LAB — TROPONIN I (HIGH SENSITIVITY): Troponin I (High Sensitivity): 3 ng/L (ref <18)

## 2022-06-05 MED ORDER — DIPHENHYDRAMINE HCL 25 MG PO CAPS
25.0000 mg | ORAL_CAPSULE | Freq: Once | ORAL | Status: AC
  Administered 2022-06-05: 25 mg via ORAL
  Filled 2022-06-05: qty 1

## 2022-06-05 MED ORDER — ACETAMINOPHEN 500 MG PO TABS
1000.0000 mg | ORAL_TABLET | Freq: Four times a day (QID) | ORAL | Status: DC | PRN
  Administered 2022-06-05 – 2022-06-06 (×4): 1000 mg via ORAL
  Filled 2022-06-05 (×4): qty 2

## 2022-06-05 MED ORDER — IBUPROFEN 400 MG PO TABS
800.0000 mg | ORAL_TABLET | Freq: Once | ORAL | Status: AC
  Administered 2022-06-05: 800 mg via ORAL
  Filled 2022-06-05: qty 2

## 2022-06-05 MED ORDER — IBUPROFEN 400 MG PO TABS
600.0000 mg | ORAL_TABLET | Freq: Once | ORAL | Status: AC
  Administered 2022-06-05: 600 mg via ORAL
  Filled 2022-06-05: qty 2

## 2022-06-05 MED ORDER — LISINOPRIL 20 MG PO TABS
20.0000 mg | ORAL_TABLET | Freq: Every day | ORAL | Status: DC
  Administered 2022-06-05 – 2022-06-06 (×2): 20 mg via ORAL
  Filled 2022-06-05 (×2): qty 1

## 2022-06-05 MED ORDER — AMLODIPINE BESYLATE 10 MG PO TABS: 10.0000 mg | ORAL_TABLET | Freq: Every day | ORAL | 1 refills | Status: AC

## 2022-06-05 MED ORDER — HYDRALAZINE HCL 20 MG/ML IJ SOLN
10.0000 mg | INTRAMUSCULAR | Status: DC | PRN
  Administered 2022-06-05: 10 mg via INTRAVENOUS
  Filled 2022-06-05: qty 1

## 2022-06-05 MED ORDER — LISINOPRIL 20 MG PO TABS: 20.0000 mg | ORAL_TABLET | Freq: Every day | ORAL | 1 refills | Status: DC

## 2022-06-05 NOTE — Progress Notes (Signed)
       CROSS COVER NOTE  NAME: Patricio Popwell MRN: [id number] DOB : 10/16/1980    Time of Service   2037 pm  HPI/Events of Note   Nurse reports patient without relief from headache pain after acetaminophen. VSS  Assessment and  Interventions   Assessment:  Plan: Dose of ibuprofen ordered       Kathlene Cote NP [facility]

## 2022-06-05 NOTE — Progress Notes (Signed)
  Transition of Care [facility]) Screening Note   Patient Details  Name: Mark Austin Date of Birth: Apr 12, 1981   Transition of Care [facility] CM/SW Contact:    Tiburcio Bash, LCSW Phone Number: 06/05/2022, 10:52 AM    Transition of Care Department [facility]) has reviewed patient and no TOC needs have been identified at this time. We will continue to monitor patient advancement through interdisciplinary progression rounds. If new patient transition needs arise, please place a TOC consult.  Kelby Fam, [place], MSW, [place]

## 2022-06-05 NOTE — Progress Notes (Signed)
PROGRESS NOTE    Mark Austin  HER:[id number] DOB: [birth year] DOA: 06/04/2022 PCP: [name], MD      Brief Narrative:   From admission h and p Mark Austin is a 41 y.o. male with medical history significant of hypertension, hyperlipidemia, TIA, hypothyroidism, depression with anxiety, PTSD, diverticulitis, chronic pain syndrome, chronic back pain, tobacco abuse, who presents with blurry vision and vision loss.   Patient was seen in the ED yesterday due to chest pain and elevated blood pressure with SBP 200s. Pt also had blurry vision.  Patient had a negative MRI of brain and negative CTA of chest/abdomen/pelvis for dissection. His trop was negative at 3. Pt was discharged home in stable condition.  He states that his blurry vision has worsened since this morning.  He has vision loss in both eyes, cannot drive normally.  Patient denies chest pain, cough, shortness of breath.  No unilateral numbness or tinglings in extremities.  No facial droop or slurred speech.  Patient has diarrhea since yesterday, totally 6 times of watery diarrhea.  He does not have nausea or vomiting.  No fever or chills.  He has right lower quadrant abdominal pain, which is mild, aching, nonradiating.  Patient states that this abdominal pain is new.  He did not have abdominal pain yesterday.  Denies symptoms of UTI.   Pt is found to have elevated blood pressure 228/132, and Cardene drip was started in ED.   Assessment & Plan:   Principal Problem:   Hypertensive emergency Active Problems:   Vision loss   HLD (hyperlipidemia)   TIA (transient ischemic attack)   Hypothyroidism   Leukocytosis   PTSD (post-traumatic stress disorder)   Depression with anxiety   Tobacco abuse   Diarrhea   Abdominal pain   Overweight   Hypertension  # Hypertensive urgency BPs now improved, off cardene gtt. Cta and mri on 11/6 nothing acute, no aki. Hasn't been on previously-prescribed antihypertensives - continue home  carvedilol - amlodipine and lisinopril added - outpt mri for pheo eval (ordered by cardiology given mildly elevated catecholamies) - also needs compliance w/ cpap - ritalin likely also contributes  # Vision loss EDP discussed w/ ophtho Dr. Lazarus Salines who thought 2/2 uncontrolled htn, didn't advise further inpatient w/u, instead outpt f/u  # Empty sella Seen on CT today. Given headache and vision changes IIH is on the ddx. Discussed w/ neuro, they will plan on seeing in consult tomorrow, may need LP  # Bipolar - home abilify  # ADHD - home ritalin   DVT prophylaxis: SCDs Code Status: full Family Communication: wife updated @ bedside  Level of care: Progressive Status is: Inpatient Remains inpatient appropriate because: neuro eval in AM    Consultants:  neurology  Procedures: none  Antimicrobials:  none    Subjective: Ongoing headache  Objective: Vitals:   06/05/22 0915 06/05/22 0925 06/05/22 1131 06/05/22 1554  BP: (!) 153/106 (!) 162/104 (!) 156/109 (!) 171/120  Pulse:  75 68 80  Resp:   20 19  Temp:   98.3 F (36.8 C) 98.2 F (36.8 C)  TempSrc:    Oral  SpO2:  98% 94% 95%  Weight:      Height:        Intake/Output Summary (Last 24 hours) at 06/05/2022 1649 Last data filed at 06/04/2022 2120 Gross per 24 hour  Intake 240 ml  Output --  Net 240 ml   Filed Weights   06/04/22 1202  Weight: 99.8 kg  Examination:  General exam: Appears calm and comfortable  Respiratory system: Clear to auscultation. Respiratory effort normal. Cardiovascular system: S1 & S2 heard, RRR. No JVD, murmurs, rubs, gallops or clicks. No pedal edema. Gastrointestinal system: Abdomen is soft, non-tender Central nervous system: Alert and oriented. No focal neurological deficits. Extremities: Symmetric 5 x 5 power. Skin: No rashes, lesions or ulcers Psychiatry: Judgement and insight appear normal. Mood & affect appropriate.     Data Reviewed: I have personally reviewed  following labs and imaging studies  CBC: Recent Labs  Lab 06/03/22 1031 06/04/22 1225 06/05/22 0049  WBC 7.5 11.0* 8.3  HGB 18.6* 19.4* 18.6*  HCT 53.1* 55.3* 53.6*  MCV 87.9 87.4 87.0  PLT 198 220 161   Basic Metabolic Panel: Recent Labs  Lab 06/03/22 1031 06/04/22 1225 06/05/22 0049  NA 137 136 133*  K 4.0 4.2 3.7  CL 104 104 104  CO2 '25 23 22  '$ GLUCOSE 91 92 111*  BUN '10 12 16  '$ CREATININE 0.98 0.92 0.85  CALCIUM 9.4 10.0 9.5   GFR: Estimated Creatinine Clearance: 139.9 mL/min (by C-G formula based on SCr of 0.85 mg/dL). Liver Function Tests: Recent Labs  Lab 06/04/22 1225  AST 25  ALT 20  ALKPHOS 55  BILITOT 1.5*  PROT 8.0  ALBUMIN 4.5   Recent Labs  Lab 06/04/22 1225  LIPASE 44   No results for input(s): "AMMONIA" in the last 168 hours. Coagulation Profile: Recent Labs  Lab 06/03/22 1041 06/04/22 1225  INR 1.0 1.0   Cardiac Enzymes: No results for input(s): "CKTOTAL", "CKMB", "CKMBINDEX", "TROPONINI" in the last 168 hours. BNP (last 3 results) No results for input(s): "PROBNP" in the last 8760 hours. HbA1C: No results for input(s): "HGBA1C" in the last 72 hours. CBG: No results for input(s): "GLUCAP" in the last 168 hours. Lipid Profile: No results for input(s): "CHOL", "HDL", "LDLCALC", "TRIG", "CHOLHDL", "LDLDIRECT" in the last 72 hours. Thyroid Function Tests: No results for input(s): "TSH", "T4TOTAL", "FREET4", "T3FREE", "THYROIDAB" in the last 72 hours. Anemia Panel: No results for input(s): "VITAMINB12", "FOLATE", "FERRITIN", "TIBC", "IRON", "RETICCTPCT" in the last 72 hours. Urine analysis:    Component Value Date/Time   COLORURINE YELLOW (A) 03/03/2022 1811   APPEARANCEUR HAZY (A) 03/03/2022 1811   LABSPEC 1.029 03/03/2022 1811   PHURINE 5.0 03/03/2022 1811   GLUCOSEU NEGATIVE 03/03/2022 1811   HGBUR NEGATIVE 03/03/2022 1811   BILIRUBINUR NEGATIVE 03/03/2022 1811   KETONESUR 20 (A) 03/03/2022 1811   PROTEINUR NEGATIVE  03/03/2022 1811   NITRITE NEGATIVE 03/03/2022 1811   LEUKOCYTESUR NEGATIVE 03/03/2022 1811   Sepsis Labs: '@LABRCNTIP'$ (procalcitonin:4,lacticidven:4)  )No results found for this or any previous visit (from the past 240 hour(s)).       Radiology Studies: CT HEAD WO CONTRAST (5MM)  Result Date: 06/05/2022 CLINICAL DATA:  Headache, tension-type headache EXAM: CT HEAD WITHOUT CONTRAST TECHNIQUE: Contiguous axial images were obtained from the base of the skull through the vertex without intravenous contrast. RADIATION DOSE REDUCTION: This exam was performed according to the departmental dose-optimization program which includes automated exposure control, adjustment of the mA and/or kV according to patient size and/or use of iterative reconstruction technique. COMPARISON:  CT head June 03, 2022. FINDINGS: Brain: No evidence of acute infarction, hemorrhage, hydrocephalus, extra-axial collection or mass lesion/mass effect. Partially empty sella. Vascular: No hyperdense vessel. Skull: No acute fracture. Sinuses/Orbits: Clear sinuses.  No acute orbital findings. Other: No mastoid effusions. IMPRESSION: 1. No evidence of acute intracranial abnormality. 2. Partially empty sella,  which is often a normal anatomic variant but can be associated with idiopathic intracranial hypertension. Electronically Signed   By: Margaretha Sheffield M.D.   On: 06/05/2022 13:26   CT ABDOMEN PELVIS W CONTRAST  Result Date: 06/04/2022 CLINICAL DATA:  Abdominal pain, acute, nonlocalized. EXAM: CT ABDOMEN AND PELVIS WITH CONTRAST TECHNIQUE: Multidetector CT imaging of the abdomen and pelvis was performed using the standard protocol following bolus administration of intravenous contrast. RADIATION DOSE REDUCTION: This exam was performed according to the departmental dose-optimization program which includes automated exposure control, adjustment of the mA and/or kV according to patient size and/or use of iterative reconstruction  technique. CONTRAST:  190m OMNIPAQUE IOHEXOL 300 MG/ML  SOLN COMPARISON:  03/03/2022 FINDINGS: Lower chest: Lung bases are clear. Hepatobiliary: Few liver cysts as seen previously. No evidence of solid or significant lesion. Previous cholecystectomy. Pancreas: Normal Spleen: Normal Adrenals/Urinary Tract: Adrenal glands are normal. Kidneys are normal. Bladder is normal. Stomach/Bowel: Stomach and small intestine are normal. Normal appendix. Diverticulosis of the left colon without evidence of diverticulitis. Previous segmental resection of the sigmoid colon. No complicating feature. Vascular/Lymphatic: Aortic atherosclerosis. No aneurysm. IVC is normal. No adenopathy. Reproductive: Normal Other: Right inguinal hernia containing only fat. Musculoskeletal: No acute finding. Previous inter spinous device at L4-5. IMPRESSION: 1. No acute finding to explain the clinical presentation. 2. Previous cholecystectomy. 3. Previous segmental resection of the sigmoid colon. No complicating feature. Diverticulosis of the left colon without evidence of diverticulitis. 4. Aortic atherosclerosis. 5. Right inguinal hernia containing only fat. Aortic Atherosclerosis (ICD10-I70.0). Electronically Signed   By: MNelson ChimesM.D.   On: 06/04/2022 17:30        Scheduled Meds:  amLODipine  10 mg Oral Daily   ARIPiprazole  15 mg Oral Daily   benztropine  1 mg Oral QHS   carvedilol  25 mg Oral BID   enoxaparin (LOVENOX) injection  40 mg Subcutaneous Q24H   ezetimibe  10 mg Oral Daily   liothyronine  5 mcg Oral BID   lisinopril  20 mg Oral Daily   methylphenidate  5 mg Oral BID WC   nicotine  21 mg Transdermal Daily   prazosin  1 mg Oral BID   Continuous Infusions:   LOS: 1 day     NDesma Maxim MD [facility]   If 7PM-7AM, please contact night-coverage [email or web address] Password [email or web address] LLC11/02/2022, 4:49 PM

## 2022-06-05 NOTE — Discharge Summary (Signed)
Mark Austin CZY:[id number] DOB: 1980-12-15 DOA: 06/04/2022  PCP: [name], MD  Admit date: 06/04/2022 Discharge date: 06/06/2022  Time spent: 35 minutes  Recommendations for Outpatient Follow-up:  Pcp f/u 1 week, bmp then Outpatient neurology f/u    Discharge Diagnoses:  Principal Problem:   Hypertensive emergency Active Problems:   Vision loss   HLD (hyperlipidemia)   TIA (transient ischemic attack)   Hypothyroidism   Leukocytosis   PTSD (post-traumatic stress disorder)   Depression with anxiety   Tobacco abuse   Diarrhea   Abdominal pain   Overweight   Hypertension   Discharge Condition: stable  Diet recommendation: heart healthy  Filed Weights   06/04/22 1202  Weight: 99.8 kg    History of present illness:  From admission h and p  HPI: Mark Austin is a 41 y.o. male with medical history significant of hypertension, hyperlipidemia, TIA, hypothyroidism, depression with anxiety, PTSD, diverticulitis, chronic pain syndrome, chronic back pain, tobacco abuse, who presents with blurry vision and vision loss.   Patient was seen in the ED yesterday due to chest pain and elevated blood pressure with SBP 200s. Pt also had blurry vision.  Patient had a negative MRI of brain and negative CTA of chest/abdomen/pelvis for dissection. His trop was negative at 3. Pt was discharged home in stable condition.  He states that his blurry vision has worsened since this morning.  He has vision loss in both eyes, cannot drive normally.  Patient denies chest pain, cough, shortness of breath.  No unilateral numbness or tinglings in extremities.  No facial droop or slurred speech.  Patient has diarrhea since yesterday, totally 6 times of watery diarrhea.  He does not have nausea or vomiting.  No fever or chills.  He has right lower quadrant abdominal pain, which is mild, aching, nonradiating.  Patient states that this abdominal pain is new.  He did not have abdominal pain yesterday.  Denies  symptoms of UTI.   Pt is found to have elevated blood pressure 228/132, and Cardene drip was started in ED.  Hospital Course:   Patient presents with hypertensive urgency, concern for emergency, with bps in the 220s. This on only coreg at home, has been out of other bp meds. Denies regular nsaids but is on ritalin. Also untreated osa. Non-con mri brain unremarkable but did complain of intermittent b/l vision loss and blurry vision. EDP discussed w/ dr. Lazarus Salines of ophtho who said likely a consequence of uncontrolled htn, advised better bp control and outpatient optho f/u. Here patient was initially treated with nicardipine gtt which has since been continued. Amlodipine was started and then lisinopril. Bp improved to normal. Given ongoing severe headache repeat head CT was obtained which showed empty sella. Given vision changes and headache there was concern for idiopathic intracranial hypertension and so neurology was consulted who evaluated and deemed patient not to have IIH. Neurology did perform contrast-enhanced MRI as w/u for patient's chronic headache, that result was normal. Will be referred to outpatient neurology for ongoing mgmt of chronic migraine. Abortive med (sumatriptan) provided.   Procedures: none   Consultations: Ophtho, neuro  Discharge Exam: Vitals:   06/06/22 0752 06/06/22 1129  BP: (!) 158/98 (!) 127/93  Pulse: 72 63  Resp: 18 18  Temp: 97.6 F (36.4 C) 97.7 F (36.5 C)  SpO2: 98% 97%    General: NAD Cardiovascular: rrr Respiratory: ctab  Discharge Instructions   Discharge Instructions     Ambulatory referral to Neurology   Complete  by: As directed    Diet - low sodium heart healthy   Complete by: As directed    Diet - low sodium heart healthy   Complete by: As directed    Increase activity slowly   Complete by: As directed    Increase activity slowly   Complete by: As directed       Allergies as of 06/06/2022       Reactions   Ketorolac  Tromethamine Anaphylaxis   Edema of the tongue, Edema, Swelling, Seizure   Lidocaine Other (See Comments), Anaphylaxis   Intolerant to lidocaine patches: Seizure   Tramadol Other (See Comments)   Seizures   Atorvastatin Other (See Comments)   Muscle aches   Brassica Oleracea Hives   "Cauliflower"   Carisoprodol Other (See Comments)   Sedation   Gabapentin Other (See Comments), Palpitations   "Feels like skin is wanting to be removed from body"   Rosuvastatin Other (See Comments)   Muscle aches        Medication List     STOP taking these medications    ibuprofen 200 MG tablet Commonly known as: ADVIL   propranolol ER 160 MG SR capsule Commonly known as: INDERAL LA       TAKE these medications    amLODipine 10 MG tablet Commonly known as: NORVASC Take 1 tablet (10 mg total) by mouth daily.   anastrozole 1 MG tablet Commonly known as: ARIMIDEX Take 1 mg by mouth once a week.   ARIPiprazole 15 MG tablet Commonly known as: ABILIFY Take 15 mg by mouth at bedtime.   benztropine 1 MG tablet Commonly known as: COGENTIN Take 1 mg by mouth 2 (two) times daily.   carvedilol 25 MG tablet Commonly known as: COREG Take 25 mg by mouth 2 (two) times daily.   ezetimibe 10 MG tablet Commonly known as: ZETIA Take 10 mg by mouth daily.   liothyronine 5 MCG tablet Commonly known as: CYTOMEL Take 5 mcg by mouth 2 (two) times daily.   lisinopril 20 MG tablet Commonly known as: ZESTRIL Take 1 tablet (20 mg total) by mouth daily.   methylphenidate 5 MG tablet Commonly known as: RITALIN Take 5 mg by mouth 2 (two) times daily with breakfast and lunch.   prazosin 1 MG capsule Commonly known as: MINIPRESS TAKE 1 CAPSULE(1 MG) BY MOUTH TWICE DAILY   promethazine 25 MG tablet Commonly known as: PHENERGAN Take 1 tablet (25 mg total) by mouth every 6 (six) hours as needed for nausea or vomiting (headache).   SUMAtriptan 100 MG tablet Commonly known as: IMITREX Take 1  tablet (100 mg total) by mouth once as needed for migraine. May repeat in 2 hours if headache persists or recurs.   tadalafil 20 MG tablet Commonly known as: CIALIS Take 20 mg by mouth daily as needed for erectile dysfunction.   testosterone cypionate 100 MG/ML injection Commonly known as: DEPOTESTOTERONE CYPIONATE Inject 200 mg into the muscle every 7 (seven) days. For IM use only  Thursday's   zaleplon 5 MG capsule Commonly known as: SONATA Take 5 mg by mouth at bedtime. May take another in the middle of the night as needed.       Allergies  Allergen Reactions   Ketorolac Tromethamine Anaphylaxis    Edema of the tongue, Edema, Swelling, Seizure   Lidocaine Other (See Comments) and Anaphylaxis    Intolerant to lidocaine patches: Seizure   Tramadol Other (See Comments)    Seizures  Atorvastatin Other (See Comments)    Muscle aches   Brassica Oleracea Hives    "Cauliflower"   Carisoprodol Other (See Comments)    Sedation   Gabapentin Other (See Comments) and Palpitations    "Feels like skin is wanting to be removed from body"   Rosuvastatin Other (See Comments)    Muscle aches    Follow-up Information     [name], MD. Go in 1 week(s).   Specialties: Family Medicine, Sports Medicine Why: Appointment on Thursday, 06/06/2022 at 3:00pm. Please arrive by 2:45pm. Contact information: [address] [place] [address] [phone]         Norvel Richards, MD. Go in 1 week(s).   Specialty: Ophthalmology Why: Appointment on Wednesday, 06/12/2022 at 2:20pm. Contact information: [address] [place] [address] [phone]                  The results of significant diagnostics from this hospitalization (including imaging, microbiology, ancillary and laboratory) are listed below for reference.    Significant Diagnostic Studies: MR BRAIN W CONTRAST  Result Date: 06/06/2022 CLINICAL DATA:  Headache, chronic. New  features or increased frequency. EXAM: MRI HEAD WITH CONTRAST TECHNIQUE: Multiplanar, multiecho pulse sequences of the brain and surrounding structures were obtained with intravenous contrast. CONTRAST:  20m GADAVIST GADOBUTROL 1 MMOL/ML IV SOLN COMPARISON:  MRI head without contrast 06/03/2022 FINDINGS: Brain: Normal enhancement of the brain. No enhancing mass lesion. Ventricle size normal. Cerebral volume normal. Vascular: Normal arterial and venous enhancement. Negative for dural venous sinus thrombosis. Skull and upper cervical spine: Negative Sinuses/Orbits: Negative Other: None IMPRESSION: Negative MRI head with contrast. Electronically Signed   By: CFranchot GalloM.D.   On: 06/06/2022 14:46   CT HEAD WO CONTRAST (5MM)  Result Date: 06/05/2022 CLINICAL DATA:  Headache, tension-type headache EXAM: CT HEAD WITHOUT CONTRAST TECHNIQUE: Contiguous axial images were obtained from the base of the skull through the vertex without intravenous contrast. RADIATION DOSE REDUCTION: This exam was performed according to the departmental dose-optimization program which includes automated exposure control, adjustment of the mA and/or kV according to patient size and/or use of iterative reconstruction technique. COMPARISON:  CT head June 03, 2022. FINDINGS: Brain: No evidence of acute infarction, hemorrhage, hydrocephalus, extra-axial collection or mass lesion/mass effect. Partially empty sella. Vascular: No hyperdense vessel. Skull: No acute fracture. Sinuses/Orbits: Clear sinuses.  No acute orbital findings. Other: No mastoid effusions. IMPRESSION: 1. No evidence of acute intracranial abnormality. 2. Partially empty sella, which is often a normal anatomic variant but can be associated with idiopathic intracranial hypertension. Electronically Signed   By: FMargaretha SheffieldM.D.   On: 06/05/2022 13:26   CT ABDOMEN PELVIS W CONTRAST  Result Date: 06/04/2022 CLINICAL DATA:  Abdominal pain, acute, nonlocalized. EXAM:  CT ABDOMEN AND PELVIS WITH CONTRAST TECHNIQUE: Multidetector CT imaging of the abdomen and pelvis was performed using the standard protocol following bolus administration of intravenous contrast. RADIATION DOSE REDUCTION: This exam was performed according to the departmental dose-optimization program which includes automated exposure control, adjustment of the mA and/or kV according to patient size and/or use of iterative reconstruction technique. CONTRAST:  1054mOMNIPAQUE IOHEXOL 300 MG/ML  SOLN COMPARISON:  03/03/2022 FINDINGS: Lower chest: Lung bases are clear. Hepatobiliary: Few liver cysts as seen previously. No evidence of solid or significant lesion. Previous cholecystectomy. Pancreas: Normal Spleen: Normal Adrenals/Urinary Tract: Adrenal glands are normal. Kidneys are normal. Bladder is normal. Stomach/Bowel: Stomach and small intestine are normal. Normal appendix.  Diverticulosis of the left colon without evidence of diverticulitis. Previous segmental resection of the sigmoid colon. No complicating feature. Vascular/Lymphatic: Aortic atherosclerosis. No aneurysm. IVC is normal. No adenopathy. Reproductive: Normal Other: Right inguinal hernia containing only fat. Musculoskeletal: No acute finding. Previous inter spinous device at L4-5. IMPRESSION: 1. No acute finding to explain the clinical presentation. 2. Previous cholecystectomy. 3. Previous segmental resection of the sigmoid colon. No complicating feature. Diverticulosis of the left colon without evidence of diverticulitis. 4. Aortic atherosclerosis. 5. Right inguinal hernia containing only fat. Aortic Atherosclerosis (ICD10-I70.0). Electronically Signed   By: Nelson Chimes M.D.   On: 06/04/2022 17:30   MR BRAIN WO CONTRAST  Result Date: 06/03/2022 CLINICAL DATA:  Neuro deficit, acute, stroke suspected also concern for PRESS EXAM: MRI HEAD WITHOUT CONTRAST TECHNIQUE: Multiplanar, multiecho pulse sequences of the brain and surrounding structures were  obtained without intravenous contrast. COMPARISON:  CT head from the same day. FINDINGS: Brain: No acute infarction, hemorrhage, hydrocephalus, extra-axial collection or mass lesion. A few small T2/FLAIR hyperintensities within the white matter, nonspecific but consider within normal limits for patient age. Vascular: Major arterial flow voids are maintained at the skull base. Skull and upper cervical spine: Normal marrow signal. Sinuses/Orbits: Clear sinuses.  No acute orbital findings. Other: No mastoid effusions. IMPRESSION: Normal brain MRI for patient age.  No evidence of acute abnormality. Electronically Signed   By: Margaretha Sheffield M.D.   On: 06/03/2022 15:35   CT Angio Chest/Abd/Pel for Dissection W and/or Wo Contrast  Result Date: 06/03/2022 CLINICAL DATA:  Aortic dissection.  History hypertension EXAM: CT ANGIOGRAPHY CHEST, ABDOMEN AND PELVIS TECHNIQUE: Non-contrast CT of the chest was initially obtained. Multidetector CT imaging through the chest, abdomen and pelvis was performed using the standard protocol during bolus administration of intravenous contrast. Multiplanar reconstructed images and MIPs were obtained and reviewed to evaluate the vascular anatomy. RADIATION DOSE REDUCTION: This exam was performed according to the departmental dose-optimization program which includes automated exposure control, adjustment of the mA and/or kV according to patient size and/or use of iterative reconstruction technique. CONTRAST:  155m OMNIPAQUE IOHEXOL 350 MG/ML SOLN IV COMPARISON:  CT abdomen and pelvis 03/03/2022 FINDINGS: CTA CHEST FINDINGS Cardiovascular: Precontrast images show no intramural hematoma or para-aortic infiltration. Aorta normal caliber without aneurysm or dissection. Heart size subjectively normal. No pericardial effusion. Pulmonary arteries patent. Mediastinum/Nodes: Esophagus unremarkable. No thoracic adenopathy. Base of cervical region normal appearance. Lungs/Pleura: Lungs clear. No  pulmonary infiltrate, pleural effusion, pneumothorax or mass. Musculoskeletal: Unremarkable Review of the MIP images confirms the above findings. CTA ABDOMEN AND PELVIS FINDINGS VASCULAR Aorta: Normal caliber with scattered atherosclerotic plaques. No aneurysmal dilatation or dissection. Celiac: Widely patent SMA: Widely patent Renals: Widely patent single BILATERAL renal arteries IMA: Patent Inflow: Normal appearance, patent Veins: Unopacified at time of imaging Review of the MIP images confirms the above findings. NON-VASCULAR Hepatobiliary: 11 mm LEFT lobe hepatic cyst. Liver otherwise normal appearance. Gallbladder surgically absent. Pancreas: Normal appearance Spleen: Normal appearance Adrenals/Urinary Tract: Adrenal glands, kidneys, ureters, and bladder normal appearance Stomach/Bowel: Normal appendix. Mild distal colonic diverticulosis without evidence of diverticulitis. Sigmoid anastomotic staple line noted. Stomach and remaining bowel loops unremarkable. Lymphatic: No adenopathy Reproductive: Unremarkable prostate gland and seminal vesicles Other: RIGHT inguinal hernia containing fat. No free air or free fluid. No inflammatory process. Musculoskeletal: Interspinous prosthesis L4-L5. No acute osseous findings. Review of the MIP images confirms the above findings. IMPRESSION: No evidence of aortic aneurysm or dissection. Mild distal colonic diverticulosis without evidence of  diverticulitis. RIGHT inguinal hernia containing fat. No acute intrathoracic, intra-abdominal, or intrapelvic abnormalities. Electronically Signed   By: Lavonia Dana M.D.   On: 06/03/2022 11:48   CT HEAD WO CONTRAST (5MM)  Result Date: 06/03/2022 CLINICAL DATA:  Provided history: Neuro deficit, acute, stroke suspected. EXAM: CT HEAD WITHOUT CONTRAST TECHNIQUE: Contiguous axial images were obtained from the base of the skull through the vertex without intravenous contrast. RADIATION DOSE REDUCTION: This exam was performed according to  the departmental dose-optimization program which includes automated exposure control, adjustment of the mA and/or kV according to patient size and/or use of iterative reconstruction technique. COMPARISON:  No pertinent prior exams available for comparison. FINDINGS: Brain: No age advanced or lobar predominant parenchymal atrophy. Partially empty sella turcica, a nonspecific finding. There is no acute intracranial hemorrhage. No demarcated cortical infarct. No extra-axial fluid collection. No evidence of an intracranial mass. No midline shift. Vascular: No hyperdense vessel. Skull: No fracture or aggressive osseous lesion. Sinuses/Orbits: No mass or acute finding within the imaged orbits. No significant paranasal sinus disease at the imaged levels. IMPRESSION: No evidence of acute intracranial abnormality. Electronically Signed   By: Kellie Simmering D.O.   On: 06/03/2022 11:47   DG Chest 2 View  Result Date: 06/03/2022 CLINICAL DATA:  Chest pain. EXAM: CHEST - 2 VIEW COMPARISON:  April 07, 2021. FINDINGS: The heart size and mediastinal contours are within normal limits. Both lungs are clear. The visualized skeletal structures are unremarkable. IMPRESSION: No active cardiopulmonary disease. Electronically Signed   By: Marijo Conception M.D.   On: 06/03/2022 11:29    Microbiology: No results found for this or any previous visit (from the past 240 hour(s)).   Labs: Basic Metabolic Panel: Recent Labs  Lab 06/03/22 1031 06/04/22 1225 06/05/22 0049  NA 137 136 133*  K 4.0 4.2 3.7  CL 104 104 104  CO2 '25 23 22  '$ GLUCOSE 91 92 111*  BUN '10 12 16  '$ CREATININE 0.98 0.92 0.85  CALCIUM 9.4 10.0 9.5   Liver Function Tests: Recent Labs  Lab 06/04/22 1225  AST 25  ALT 20  ALKPHOS 55  BILITOT 1.5*  PROT 8.0  ALBUMIN 4.5   Recent Labs  Lab 06/04/22 1225  LIPASE 44   No results for input(s): "AMMONIA" in the last 168 hours. CBC: Recent Labs  Lab 06/03/22 1031 06/04/22 1225 06/05/22 0049   WBC 7.5 11.0* 8.3  HGB 18.6* 19.4* 18.6*  HCT 53.1* 55.3* 53.6*  MCV 87.9 87.4 87.0  PLT 198 220 199   Cardiac Enzymes: No results for input(s): "CKTOTAL", "CKMB", "CKMBINDEX", "TROPONINI" in the last 168 hours. BNP: BNP (last 3 results) No results for input(s): "BNP" in the last 8760 hours.  ProBNP (last 3 results) No results for input(s): "PROBNP" in the last 8760 hours.  CBG: No results for input(s): "GLUCAP" in the last 168 hours.     Signed:  Desma Maxim MD.  [facility] 06/06/2022, 3:25 PM

## 2022-06-06 ENCOUNTER — Inpatient Hospital Stay: Payer: Managed Care, Other (non HMO)

## 2022-06-06 ENCOUNTER — Inpatient Hospital Stay: Payer: Managed Care, Other (non HMO) | Admitting: Family Medicine

## 2022-06-06 DIAGNOSIS — I161 Hypertensive emergency: Secondary | ICD-10-CM | POA: Diagnosis not present

## 2022-06-06 MED ORDER — DIPHENHYDRAMINE HCL 25 MG PO CAPS
50.0000 mg | ORAL_CAPSULE | Freq: Four times a day (QID) | ORAL | Status: DC | PRN
  Administered 2022-06-06: 50 mg via ORAL
  Filled 2022-06-06: qty 2

## 2022-06-06 MED ORDER — IBUPROFEN 400 MG PO TABS
600.0000 mg | ORAL_TABLET | Freq: Four times a day (QID) | ORAL | Status: DC | PRN
  Administered 2022-06-06: 600 mg via ORAL
  Filled 2022-06-06: qty 2

## 2022-06-06 MED ORDER — DEXAMETHASONE SODIUM PHOSPHATE 10 MG/ML IJ SOLN
10.0000 mg | Freq: Once | INTRAMUSCULAR | Status: AC
  Administered 2022-06-06: 10 mg via INTRAVENOUS
  Filled 2022-06-06: qty 1

## 2022-06-06 MED ORDER — GADOBUTROL 1 MMOL/ML IV SOLN
10.0000 mL | Freq: Once | INTRAVENOUS | Status: AC | PRN
  Administered 2022-06-06: 10 mL via INTRAVENOUS

## 2022-06-06 MED ORDER — DIPHENHYDRAMINE HCL 50 MG/ML IJ SOLN
25.0000 mg | Freq: Once | INTRAMUSCULAR | Status: AC
  Administered 2022-06-06: 25 mg via INTRAVENOUS
  Filled 2022-06-06: qty 1

## 2022-06-06 MED ORDER — SUMATRIPTAN SUCCINATE 100 MG PO TABS: 100.0000 mg | ORAL_TABLET | Freq: Once | ORAL | 0 refills | Status: DC | PRN

## 2022-06-06 MED ORDER — PROCHLORPERAZINE EDISYLATE 10 MG/2ML IJ SOLN
10.0000 mg | Freq: Once | INTRAMUSCULAR | Status: AC
  Administered 2022-06-06: 10 mg via INTRAVENOUS
  Filled 2022-06-06: qty 2

## 2022-06-06 MED ORDER — PROMETHAZINE HCL 25 MG PO TABS: 25.0000 mg | ORAL_TABLET | Freq: Four times a day (QID) | ORAL | 2 refills | Status: DC | PRN

## 2022-06-06 NOTE — Plan of Care (Signed)

## 2022-06-06 NOTE — Progress Notes (Signed)
[facility] Fortes to be D/C'd Home per MD order.  Discussed prescriptions and follow up appointments with the patient. Prescriptions given to patient, medication list explained in detail. Pt verbalized understanding.  Allergies as of 06/06/2022       Reactions   Ketorolac Tromethamine Anaphylaxis   Edema of the tongue, Edema, Swelling, Seizure   Lidocaine Other (See Comments), Anaphylaxis   Intolerant to lidocaine patches: Seizure   Tramadol Other (See Comments)   Seizures   Atorvastatin Other (See Comments)   Muscle aches   Brassica Oleracea Hives   "Cauliflower"   Carisoprodol Other (See Comments)   Sedation   Gabapentin Other (See Comments), Palpitations   "Feels like skin is wanting to be removed from body"   Rosuvastatin Other (See Comments)   Muscle aches        Medication List     STOP taking these medications    ibuprofen 200 MG tablet Commonly known as: ADVIL   propranolol ER 160 MG SR capsule Commonly known as: INDERAL LA       TAKE these medications    amLODipine 10 MG tablet Commonly known as: NORVASC Take 1 tablet (10 mg total) by mouth daily.   anastrozole 1 MG tablet Commonly known as: ARIMIDEX Take 1 mg by mouth once a week.   ARIPiprazole 15 MG tablet Commonly known as: ABILIFY Take 15 mg by mouth at bedtime.   benztropine 1 MG tablet Commonly known as: COGENTIN Take 1 mg by mouth 2 (two) times daily.   carvedilol 25 MG tablet Commonly known as: COREG Take 25 mg by mouth 2 (two) times daily.   ezetimibe 10 MG tablet Commonly known as: ZETIA Take 10 mg by mouth daily.   liothyronine 5 MCG tablet Commonly known as: CYTOMEL Take 5 mcg by mouth 2 (two) times daily.   lisinopril 20 MG tablet Commonly known as: ZESTRIL Take 1 tablet (20 mg total) by mouth daily.   methylphenidate 5 MG tablet Commonly known as: RITALIN Take 5 mg by mouth 2 (two) times daily with breakfast and lunch.   prazosin 1 MG capsule Commonly known as:  MINIPRESS TAKE 1 CAPSULE(1 MG) BY MOUTH TWICE DAILY   promethazine 25 MG tablet Commonly known as: PHENERGAN Take 1 tablet (25 mg total) by mouth every 6 (six) hours as needed for nausea or vomiting (headache).   SUMAtriptan 100 MG tablet Commonly known as: IMITREX Take 1 tablet (100 mg total) by mouth once as needed for migraine. May repeat in 2 hours if headache persists or recurs.   tadalafil 20 MG tablet Commonly known as: CIALIS Take 20 mg by mouth daily as needed for erectile dysfunction.   testosterone cypionate 100 MG/ML injection Commonly known as: DEPOTESTOTERONE CYPIONATE Inject 200 mg into the muscle every 7 (seven) days. For IM use only  Thursday's   zaleplon 5 MG capsule Commonly known as: SONATA Take 5 mg by mouth at bedtime. May take another in the middle of the night as needed.        Vitals:   06/06/22 1129 06/06/22 1629  BP: (!) 127/93 120/81  Pulse: 63 73  Resp: 18 18  Temp: 97.7 F (36.5 C) 97.7 F (36.5 C)  SpO2: 97% 97%    IV catheter discontinued intact. Site without signs and symptoms of complications. Dressing and pressure applied. Pt denies pain at this time. No complaints noted.  An After Visit Summary was printed and given to the patient. Patient escorted via WC, and D/C  home via private auto.  Rolley Sims

## 2022-06-10 ENCOUNTER — Emergency Department
Admission: EM | Admit: 2022-06-10 | Discharge: 2022-06-10 | Disposition: A | Discharge status: Home / Self Care | Payer: Managed Care, Other (non HMO) | Attending: Emergency Medicine | Admitting: Emergency Medicine

## 2022-06-10 ENCOUNTER — Inpatient Hospital Stay: Payer: Managed Care, Other (non HMO) | Admitting: Family Medicine

## 2022-06-10 ENCOUNTER — Ambulatory Visit: Payer: Self-pay | Admitting: *Deleted

## 2022-06-10 ENCOUNTER — Telehealth: Payer: Self-pay

## 2022-06-10 ENCOUNTER — Emergency Department: Payer: Managed Care, Other (non HMO)

## 2022-06-10 DIAGNOSIS — I10 Essential (primary) hypertension: Secondary | ICD-10-CM | POA: Diagnosis not present

## 2022-06-10 DIAGNOSIS — R519 Headache, unspecified: Secondary | ICD-10-CM | POA: Insufficient documentation

## 2022-06-10 DIAGNOSIS — H538 Other visual disturbances: Secondary | ICD-10-CM | POA: Diagnosis not present

## 2022-06-10 DIAGNOSIS — E039 Hypothyroidism, unspecified: Secondary | ICD-10-CM | POA: Insufficient documentation

## 2022-06-10 DIAGNOSIS — R0789 Other chest pain: Secondary | ICD-10-CM | POA: Insufficient documentation

## 2022-06-10 LAB — BASIC METABOLIC PANEL
Anion gap: 10 (ref 5–15)
BUN: 16 mg/dL (ref 6–20)
CO2: 21 mmol/L — ABNORMAL LOW (ref 22–32)
Calcium: 10.1 mg/dL (ref 8.9–10.3)
Chloride: 104 mmol/L (ref 98–111)
Creatinine, Ser: 0.83 mg/dL (ref 0.61–1.24)
GFR, Estimated: >60 mL/min (ref >60)
Glucose, Bld: 99 mg/dL (ref 70–99)
Potassium: 4.2 mmol/L (ref 3.5–5.1)
Sodium: 135 mmol/L (ref 135–145)

## 2022-06-10 LAB — CBC
HCT: 55.2 % — ABNORMAL HIGH (ref 39.0–52.0)
Hemoglobin: 19.5 g/dL — ABNORMAL HIGH (ref 13.0–17.0)
MCH: 30.9 pg (ref 26.0–34.0)
MCHC: 35.3 g/dL (ref 30.0–36.0)
MCV: 87.3 fL (ref 80.0–100.0)
Platelets: 224 10*3/uL (ref 150–400)
RBC: 6.32 MIL/uL — ABNORMAL HIGH (ref 4.22–5.81)
RDW: 11.9 % (ref 11.5–15.5)
WBC: 8.3 10*3/uL (ref 4.0–10.5)
nRBC: 0 % (ref 0.0–0.2)

## 2022-06-10 LAB — TROPONIN I (HIGH SENSITIVITY)
Troponin I (High Sensitivity): 3 ng/L (ref <18)
Troponin I (High Sensitivity): 3 ng/L (ref <18)

## 2022-06-10 MED ORDER — IBUPROFEN 400 MG PO TABS
400.0000 mg | ORAL_TABLET | Freq: Once | ORAL | Status: AC
  Administered 2022-06-10: 400 mg via ORAL
  Filled 2022-06-10: qty 1

## 2022-06-10 MED ORDER — METOCLOPRAMIDE HCL 5 MG/ML IJ SOLN
10.0000 mg | Freq: Once | INTRAMUSCULAR | Status: AC
  Administered 2022-06-10: 10 mg via INTRAVENOUS
  Filled 2022-06-10: qty 2

## 2022-06-10 MED ORDER — DIPHENHYDRAMINE HCL 50 MG/ML IJ SOLN
25.0000 mg | Freq: Once | INTRAMUSCULAR | Status: AC
  Administered 2022-06-10: 25 mg via INTRAVENOUS
  Filled 2022-06-10: qty 1

## 2022-06-10 MED ORDER — ACETAMINOPHEN 500 MG PO TABS
1000.0000 mg | ORAL_TABLET | Freq: Once | ORAL | Status: AC
  Administered 2022-06-10: 1000 mg via ORAL
  Filled 2022-06-10: qty 2

## 2022-06-10 NOTE — ED Triage Notes (Addendum)
Pt arrives via POV. Pt sts that his BP is elevated and is having chest pain with severe headache. Pt sts that he has taken all of his BP meds today around 0700 and is still having high bp. Pt called his PCP about his s/s with the hypertension and they advised pt to come and be seen . Pt skin is clammy.

## 2022-06-10 NOTE — Telephone Encounter (Signed)
  Chief Complaint: elevated BP  Symptoms: BP 185/11 then BP 200/130 headache severe, blurred vision, nausea, dizziness. Took all BP medications this am at 0700. Frequency: now  Pertinent Negatives: Patient denies na  Disposition: '[x]'$ ED /'[]'$ Urgent Care (no appt availability in office) / '[]'$ Appointment(In office/virtual)/ '[]'$  [place] [facility]/ '[]'$ Home Care/ '[]'$ Refused Recommended Disposition /'[]'$ [facility]/ '[]'$  Follow-up with PCP Additional Notes:   Recommended ED and call 911 if sx worsen . Called by Kennyth Lose from [facility] and 3 way call with patient .    Reason for Disposition  [6] Systolic BP  >= 834 OR Diastolic >= 196 AND [2] cardiac (e.g., breathing difficulty, chest pain) or neurologic symptoms (e.g., new-onset blurred or double vision, unsteady gait)  Answer Assessment - Initial Assessment Questions 1. BLOOD PRESSURE: "What is the blood pressure?" "Did you take at least two measurements 5 minutes apart?"     BP 185/111 then BP 200 130 2. ONSET: "When did you take your blood pressure?"     Prior to call 3. HOW: "How did you take your blood pressure?" (e.g., automatic home BP monitor, visiting nurse)     na 4. HISTORY: "Do you have a history of high blood pressure?"     Yes  5. MEDICINES: "Are you taking any medicines for blood pressure?" "Have you missed any doses recently?"     Yes has not missed doses. Took medication at 0700 am today  6. OTHER SYMPTOMS: "Do you have any symptoms?" (e.g., blurred vision, chest pain, difficulty breathing, headache, weakness)     Severe headache, dizziness, nausea, blurred vision 7. PREGNANCY: "Is there any chance you are pregnant?" "When was your last menstrual period?"     na  Protocols used: Blood Pressure - High-A-AH

## 2022-06-10 NOTE — Telephone Encounter (Signed)
Transition Care Management Follow-up Telephone Call Date of discharge and from where:TCM DC [facility] 06-06-22 Dx: hypertensive emergency   How have you been since you were released from the hospital? Not so well- BP still running high  Any questions or concerns? No  Items Reviewed: Did the pt receive and understand the discharge instructions provided? Yes  Medications obtained and verified? Yes  Other? No  Any new allergies since your discharge? No  Dietary orders reviewed? Yes Do you have support at home? Yes   Home Care and Equipment/Supplies: Were home health services ordered? no If so, what is the name of the agency? na  Has the agency set up a time to come to the patient's home? not applicable Were any new equipment or medical supplies ordered?  No What is the name of the medical supply agency? na Were you able to get the supplies/equipment? not applicable Do you have any questions related to the use of the equipment or supplies? No  Functional Questionnaire: (I = Independent and D = Dependent) ADLs: I  Bathing/Dressing- I  Meal Prep- I  Eating- I  Maintaining continence- I  Transferring/Ambulation- I  Managing Meds- I  Follow up appointments reviewed:  PCP Hospital f/u appt confirmed? Yes  Scheduled to see Dr Zigmund Daniel on 06-12-22 @ [facility] f/u appt confirmed? No - pt needs fu appt with neurosurgeon . Are transportation arrangements needed? No  If their condition worsens, is the pt aware to call PCP or go to the Emergency Dept.? Yes Was the patient provided with contact information for the PCP's office or ED? Yes Was to pt encouraged to call back with questions or concerns? Yes   Juanda Crumble LPN [place] Direct Dial [phone]

## 2022-06-10 NOTE — Discharge Instructions (Signed)
Your blood work was reassuring.  Your blood pressure came down after your headache was better controlled.  Please follow-up with neurology.  If you have recurrent headache you can take the sumatriptan as well as Tylenol and Motrin.  Please continue to take your blood pressure medications as prescribed.  Please check your blood pressure once daily and have a recording of this you can follow-up with with your primary doctor you may need to make medication adjustments.

## 2022-06-10 NOTE — ED Provider Notes (Signed)
[facility] Provider Note    Event Date/Time   First MD Initiated Contact with Patient 06/10/22 1151     (approximate)   History   Chest Pain   HPI  Mark Austin is a 41 y.o. male past medical history of hypertension hyperlipidemia hypothyroidism depression anxiety PTSD and chronic pain who presents with headache.  Patient tells me headache started about 9 AM today 2 hours after he woke up.  Was rather sudden in onset.  It is bifrontal throbbing associated with blurred vision.  Feels similar to headache he had several days ago when he was admitted for hypertensive emergency but feels worse.  Denies associated neck pain numbness tingling or weakness.  Has had nausea but no vomiting.  Patient also endorses some mild chest tightness feels like indigestion but is not significant pain.  Patient was recently seen in the ED back to back for elevated blood pressure headache and chest pain.  Head CT a of the chest which is negative for dissection negative Noncon CT negative MRI brain.  He was significantly hypertensive in the ED and on second ED visit was started on Cardene drip and admitted.  MRI brain with and without contrast was also obtained which was negative for acute abnormality.  Patient's blood pressure improved.  Vision changes improved somewhat he was discharged.  Patient tells me his blood pressures been in the 419F to 790 systolic at home.  After the headache came on today checked his blood pressure and it was more elevated.  His primary doctor called because he had an outpatient follow-up appointment today but because of elevated blood pressure and headache he was told to come the emergency department.  Patient has been compliant with his medications.  He did take sumatriptan to try to abort the headache after it came on    Past Medical History:  Diagnosis Date   Anxiety    Back ache    Depression    Diverticulitis    GERD (gastroesophageal reflux disease)     History of kidney stones    Hypertension    Inguinal hernia    PTSD (post-traumatic stress disorder)    Seizure ([place])    over 20 yrs ago.  related to medications    Patient Active Problem List   Diagnosis Date Noted   Hypertensive emergency 06/04/2022   Vision loss 06/04/2022   HLD (hyperlipidemia) 06/04/2022   Hypothyroidism 06/04/2022   Depression with anxiety 06/04/2022   Tobacco abuse 06/04/2022   Leukocytosis 06/04/2022   Diarrhea 06/04/2022   Abdominal pain 06/04/2022   Overweight 06/04/2022   Scrotal pain, left 03/03/2022   Elevated sed rate 10/30/2021   Abnormal drug screen (09/12/2021) 10/30/2021   Marijuana use 10/30/2021   Elevated urine levels of catecholamines 10/01/2021   Chronic pain syndrome 09/12/2021   Pharmacologic therapy 09/12/2021   Disorder of skeletal system 09/12/2021   Problems influencing health status 09/12/2021   Abnormal MRI, lumbar spine (09/03/2021) 09/12/2021   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, L5-S1) 09/12/2021   Lumbar facet arthropathy (L4-5) (Bilateral) 09/12/2021   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) 09/12/2021   Lumbosacral lateral recess stenosis (Right: L5-S1) 09/12/2021   Lumbar intervertebral disc protrusion (L4-5, L5-S1) 09/12/2021   Urinary and fecal incontinence (09/03/2021) 09/12/2021    Class: History of   Failed spinal cord stimulator, 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   Chronic groin pain (Left) 09/12/2021   Chronic hip  pain (3ry area of Pain) (Bilateral) (L>R) 09/12/2021   Chronic knee pain (4th area of Pain) (intermittent) (Bilateral) 09/12/2021   Discogenic low back pain (L4-5, L5-S1) 09/12/2021   Lumbar facet joint syndrome 09/12/2021   Primary osteoarthritis of knees, bilateral 07/24/2021   History of lumbar surgery 07/24/2021   Annual physical exam 07/11/2021   Restless legs 07/11/2021   Chronic lower extremity pain (2ry area of  Pain) (Bilateral) (L>R) 07/11/2021   Male erectile disorder 05/29/2021   Obstructive sleep apnea of adult 05/29/2021   PTSD (post-traumatic stress disorder) 05/29/2021   Hypogonadism male 05/29/2021   Chronic low back pain (1ry area of Pain) (Midline) w/ sciatica (Bilateral) 05/29/2021   Seizure-like activity ([place]) 05/14/2021   Diverticulitis of colon (without mention of hemorrhage)(562.11)    Polyp of sigmoid colon    Sleep apnea 03/07/2021   Decreased hearing of right ear 03/07/2021   Hypertension 03/07/2021   Chronic abdominal pain 03/07/2021   Migraine headache 03/07/2021   Myalgia and myositis 03/07/2021   Anxiety state 03/07/2021   TIA (transient ischemic attack) 11/25/2016   Constipation 08/22/2016     Physical Exam  Triage Vital Signs: ED Triage Vitals  Enc Vitals Group     BP 06/10/22 1106 (!) 222/125     Pulse Rate 06/10/22 1106 84     Resp 06/10/22 1106 17     Temp 06/10/22 1106 98.8 F (37.1 C)     Temp Source 06/10/22 1106 Oral     SpO2 06/10/22 1106 98 %     Weight 06/10/22 1107 220 lb (99.8 kg)     Height --      Head Circumference --      Peak Flow --      Pain Score 06/10/22 1107 7     Pain Loc --      Pain Edu? --      Excl. in [place]? --     Most recent vital signs: Vitals:   06/10/22 1400 06/10/22 1500  BP: (!) 137/90 (!) 157/112  Pulse: 71 86  Resp: 20 20  Temp:  98.5 F (36.9 C)  SpO2: 97% 96%     General: Awake, no distress.  CV:  Good peripheral perfusion.  Resp:  Normal effort.  Abd:  No distention.  Neuro:             Awake, Alert, Oriented x 3  Other:  Aox3, nml speech  PERRL, EOMI, face symmetric, nml tongue movement  5/5 strength in the BL upper and lower extremities  Sensation grossly intact in the BL upper and lower extremities  Finger-nose-finger intact BL    ED Results / Procedures / Treatments  Labs (all labs ordered are listed, but only abnormal results are displayed) Labs Reviewed  BASIC METABOLIC PANEL - Abnormal;  Notable for the following components:      Result Value   CO2 21 (*)    All other components within normal limits  CBC - Abnormal; Notable for the following components:   RBC 6.32 (*)    Hemoglobin 19.5 (*)    HCT 55.2 (*)    All other components within normal limits  TROPONIN I (HIGH SENSITIVITY)  TROPONIN I (HIGH SENSITIVITY)     EKG   EKG interpretation performed by myself: NSR, nml axis, nml intervals, no acute ischemic changes   RADIOLOGY I reviewed and interpreted the CXR which does not show any acute cardiopulmonary process    PROCEDURES:  Critical Care performed: No  Procedures  The patient is on the cardiac monitor to evaluate for evidence of arrhythmia and/or significant heart rate changes.   MEDICATIONS ORDERED IN ED: Medications  metoCLOPramide (REGLAN) injection 10 mg (10 mg Intravenous Given 06/10/22 1342)  diphenhydrAMINE (BENADRYL) injection 25 mg (25 mg Intravenous Given 06/10/22 1342)  ibuprofen (ADVIL) tablet 400 mg (400 mg Oral Given 06/10/22 1451)  acetaminophen (TYLENOL) tablet 1,000 mg (1,000 mg Oral Given 06/10/22 1451)     IMPRESSION / MDM / ASSESSMENT AND PLAN / ED COURSE  I reviewed the triage vital signs and the nursing notes.                              Patient's presentation is most consistent with acute presentation with potential threat to life or bodily function.  Differential diagnosis includes, but is not limited to, hypertensive urgency/emergency, subarachnoid hemorrhage, migraine headache, cluster headache, tension headache  The patient is a 41 year old male with history of hypertension presents with acute onset of headache and blurred vision.  Symptoms started about 2 hours after awakening at 9 AM today.  He endorses feeling like his vision is out of focus denies double vision no numbness tingling or weakness has had nausea but no vomiting.  Initial blood pressure in the 200s.  Patient had very similar presentation several  days ago when she was admitted on Cardene drip.  Had negative MRI with and without contrast MRI without contrast in 2 CT heads.  Neurology was consulted because empty sella was seen on CT but they did not feel that his presentation was consistent with IIH.  Patient notes that his blurred vision did improve in the hospital but still persisted after he left.  Does feel worse now that his headache is come back today.  He also endorses some mild chest discomfort feels like indigestion and similar to presentation several days ago as well during which she had negative troponins and negative CTA to rule out dissection.  On exam patient overall looks well his neurologic exam is nonfocal.  EKG is nonischemic.  Given the recent presentation do not feel that obtaining a CT head will be of much utility.  Plan to treat as an migraine with Benadryl and Reglan and reassess.  His blood pressure has already come down spontaneously to the 160s which apparently is where its been running at home.     Patient's blood pressure is now 137/90 after receiving Benadryl and Reglan.  On reassessment patient still complains of mild headache but is improved.  Will give Tylenol and ibuprofen.  Blood work including troponin CBC and BMP are reassuring.  After Tylenol Motrin patient feels again improved and his vision has now returned to baseline.  Blood pressure is still elevated 157/112.  Again this seems to be more chronic for him.  Do not feel that any additional imaging or acute blood pressure lowering is necessary today.  Stressed the importance of ongoing medication compliance with his antihypertensives.  Recommended Tylenol and Motrin for pain and I will also refer him to neurology.  FINAL CLINICAL IMPRESSION(S) / ED DIAGNOSES   Final diagnoses:  Nonintractable headache, unspecified chronicity pattern, unspecified headache type  Hypertension, unspecified type     Rx / DC Orders   ED Discharge Orders     None         Note:  This document was prepared using Dragon voice recognition software and may include unintentional dictation errors.  Rada Hay, MD 06/10/22 [phone]

## 2022-06-12 ENCOUNTER — Encounter: Payer: Self-pay | Admitting: Family Medicine

## 2022-06-12 ENCOUNTER — Ambulatory Visit (INDEPENDENT_AMBULATORY_CARE_PROVIDER_SITE_OTHER): Payer: Managed Care, Other (non HMO) | Admitting: Family Medicine

## 2022-06-12 VITALS — BP 133/86 | HR 77 | Ht 72.0 in | Wt 226.0 lb

## 2022-06-12 DIAGNOSIS — K5732 Diverticulitis of large intestine without perforation or abscess without bleeding: Secondary | ICD-10-CM

## 2022-06-12 DIAGNOSIS — G4700 Insomnia, unspecified: Secondary | ICD-10-CM

## 2022-06-12 DIAGNOSIS — F411 Generalized anxiety disorder: Secondary | ICD-10-CM

## 2022-06-12 DIAGNOSIS — Z720 Tobacco use: Secondary | ICD-10-CM

## 2022-06-12 DIAGNOSIS — G4733 Obstructive sleep apnea (adult) (pediatric): Secondary | ICD-10-CM

## 2022-06-12 DIAGNOSIS — I10 Essential (primary) hypertension: Secondary | ICD-10-CM | POA: Diagnosis not present

## 2022-06-12 DIAGNOSIS — F418 Other specified anxiety disorders: Secondary | ICD-10-CM

## 2022-06-12 DIAGNOSIS — F4310 Post-traumatic stress disorder, unspecified: Secondary | ICD-10-CM

## 2022-06-12 MED ORDER — ZALEPLON 5 MG PO CAPS: 5.0000 mg | ORAL_CAPSULE | Freq: Every evening | ORAL | 0 refills | Status: DC

## 2022-06-12 MED ORDER — BENZTROPINE MESYLATE 1 MG PO TABS: 1.0000 mg | ORAL_TABLET | Freq: Two times a day (BID) | ORAL | 1 refills | Status: DC

## 2022-06-12 MED ORDER — VARENICLINE TARTRATE (STARTER) 0.5 MG X 11 & 1 MG X 42 PO TBPK: ORAL_TABLET | ORAL | 0 refills | Status: DC

## 2022-06-12 MED ORDER — ARIPIPRAZOLE 15 MG PO TABS: 15.0000 mg | ORAL_TABLET | Freq: Every evening | ORAL | 0 refills | Status: DC

## 2022-06-12 MED ORDER — METHYLPHENIDATE HCL 5 MG PO TABS: 5.0000 mg | ORAL_TABLET | Freq: Two times a day (BID) | ORAL | 0 refills | Status: DC

## 2022-06-12 MED ORDER — PRAZOSIN HCL 1 MG PO CAPS: ORAL_CAPSULE | ORAL | 5 refills | Status: DC

## 2022-06-12 NOTE — Progress Notes (Unsigned)
Primary Care / Sports Medicine Office Visit  Patient Information:  Patient ID: Mark Austin, male DOB: [birth year] Age: 41 y.o. MRN: [id number]   Mark Austin is a pleasant 41 y.o. male presenting with the following:  Chief Complaint  Patient presents with   Hypertension   Hospitalization Follow-up    Vitals:   06/12/22 0936 06/12/22 0944  BP: (!) 140/98 133/86  Pulse: 77   SpO2: 98%    Vitals:   06/12/22 0936  Weight: 226 lb (102.5 kg)  Height: 6' (1.829 m)   Body mass index is 30.65 kg/m.  DG Chest 2 View  Result Date: 06/10/2022 CLINICAL DATA:  Chest pain.  Hypertension. EXAM: CHEST - 2 VIEW COMPARISON:  06/03/2022 FINDINGS: The heart size and mediastinal contours are within normal limits. Both lungs are clear. The visualized skeletal structures are unremarkable. IMPRESSION: No active cardiopulmonary disease. Electronically Signed   By: Marlaine Hind M.D.   On: 06/10/2022 11:34   MR BRAIN W CONTRAST  Result Date: 06/06/2022 CLINICAL DATA:  Headache, chronic. New features or increased frequency. EXAM: MRI HEAD WITH CONTRAST TECHNIQUE: Multiplanar, multiecho pulse sequences of the brain and surrounding structures were obtained with intravenous contrast. CONTRAST:  39m GADAVIST GADOBUTROL 1 MMOL/ML IV SOLN COMPARISON:  MRI head without contrast 06/03/2022 FINDINGS: Brain: Normal enhancement of the brain. No enhancing mass lesion. Ventricle size normal. Cerebral volume normal. Vascular: Normal arterial and venous enhancement. Negative for dural venous sinus thrombosis. Skull and upper cervical spine: Negative Sinuses/Orbits: Negative Other: None IMPRESSION: Negative MRI head with contrast. Electronically Signed   By: CFranchot GalloM.D.   On: 06/06/2022 14:46   CT HEAD WO CONTRAST (5MM)  Result Date: 06/05/2022 CLINICAL DATA:  Headache, tension-type headache EXAM: CT HEAD WITHOUT CONTRAST TECHNIQUE: Contiguous axial images were obtained from the base of the skull through the  vertex without intravenous contrast. RADIATION DOSE REDUCTION: This exam was performed according to the departmental dose-optimization program which includes automated exposure control, adjustment of the mA and/or kV according to patient size and/or use of iterative reconstruction technique. COMPARISON:  CT head June 03, 2022. FINDINGS: Brain: No evidence of acute infarction, hemorrhage, hydrocephalus, extra-axial collection or mass lesion/mass effect. Partially empty sella. Vascular: No hyperdense vessel. Skull: No acute fracture. Sinuses/Orbits: Clear sinuses.  No acute orbital findings. Other: No mastoid effusions. IMPRESSION: 1. No evidence of acute intracranial abnormality. 2. Partially empty sella, which is often a normal anatomic variant but can be associated with idiopathic intracranial hypertension. Electronically Signed   By: FMargaretha SheffieldM.D.   On: 06/05/2022 13:26   CT ABDOMEN PELVIS W CONTRAST  Result Date: 06/04/2022 CLINICAL DATA:  Abdominal pain, acute, nonlocalized. EXAM: CT ABDOMEN AND PELVIS WITH CONTRAST TECHNIQUE: Multidetector CT imaging of the abdomen and pelvis was performed using the standard protocol following bolus administration of intravenous contrast. RADIATION DOSE REDUCTION: This exam was performed according to the departmental dose-optimization program which includes automated exposure control, adjustment of the mA and/or kV according to patient size and/or use of iterative reconstruction technique. CONTRAST:  1062mOMNIPAQUE IOHEXOL 300 MG/ML  SOLN COMPARISON:  03/03/2022 FINDINGS: Lower chest: Lung bases are clear. Hepatobiliary: Few liver cysts as seen previously. No evidence of solid or significant lesion. Previous cholecystectomy. Pancreas: Normal Spleen: Normal Adrenals/Urinary Tract: Adrenal glands are normal. Kidneys are normal. Bladder is normal. Stomach/Bowel: Stomach and small intestine are normal. Normal appendix. Diverticulosis of the left colon without  evidence of diverticulitis. Previous segmental resection of the sigmoid  colon. No complicating feature. Vascular/Lymphatic: Aortic atherosclerosis. No aneurysm. IVC is normal. No adenopathy. Reproductive: Normal Other: Right inguinal hernia containing only fat. Musculoskeletal: No acute finding. Previous inter spinous device at L4-5. IMPRESSION: 1. No acute finding to explain the clinical presentation. 2. Previous cholecystectomy. 3. Previous segmental resection of the sigmoid colon. No complicating feature. Diverticulosis of the left colon without evidence of diverticulitis. 4. Aortic atherosclerosis. 5. Right inguinal hernia containing only fat. Aortic Atherosclerosis (ICD10-I70.0). Electronically Signed   By: Nelson Chimes M.D.   On: 06/04/2022 17:30   MR BRAIN WO CONTRAST  Result Date: 06/03/2022 CLINICAL DATA:  Neuro deficit, acute, stroke suspected also concern for PRESS EXAM: MRI HEAD WITHOUT CONTRAST TECHNIQUE: Multiplanar, multiecho pulse sequences of the brain and surrounding structures were obtained without intravenous contrast. COMPARISON:  CT head from the same day. FINDINGS: Brain: No acute infarction, hemorrhage, hydrocephalus, extra-axial collection or mass lesion. A few small T2/FLAIR hyperintensities within the white matter, nonspecific but consider within normal limits for patient age. Vascular: Major arterial flow voids are maintained at the skull base. Skull and upper cervical spine: Normal marrow signal. Sinuses/Orbits: Clear sinuses.  No acute orbital findings. Other: No mastoid effusions. IMPRESSION: Normal brain MRI for patient age.  No evidence of acute abnormality. Electronically Signed   By: Margaretha Sheffield M.D.   On: 06/03/2022 15:35   CT Angio Chest/Abd/Pel for Dissection W and/or Wo Contrast  Result Date: 06/03/2022 CLINICAL DATA:  Aortic dissection.  History hypertension EXAM: CT ANGIOGRAPHY CHEST, ABDOMEN AND PELVIS TECHNIQUE: Non-contrast CT of the chest was initially  obtained. Multidetector CT imaging through the chest, abdomen and pelvis was performed using the standard protocol during bolus administration of intravenous contrast. Multiplanar reconstructed images and MIPs were obtained and reviewed to evaluate the vascular anatomy. RADIATION DOSE REDUCTION: This exam was performed according to the departmental dose-optimization program which includes automated exposure control, adjustment of the mA and/or kV according to patient size and/or use of iterative reconstruction technique. CONTRAST:  125m OMNIPAQUE IOHEXOL 350 MG/ML SOLN IV COMPARISON:  CT abdomen and pelvis 03/03/2022 FINDINGS: CTA CHEST FINDINGS Cardiovascular: Precontrast images show no intramural hematoma or para-aortic infiltration. Aorta normal caliber without aneurysm or dissection. Heart size subjectively normal. No pericardial effusion. Pulmonary arteries patent. Mediastinum/Nodes: Esophagus unremarkable. No thoracic adenopathy. Base of cervical region normal appearance. Lungs/Pleura: Lungs clear. No pulmonary infiltrate, pleural effusion, pneumothorax or mass. Musculoskeletal: Unremarkable Review of the MIP images confirms the above findings. CTA ABDOMEN AND PELVIS FINDINGS VASCULAR Aorta: Normal caliber with scattered atherosclerotic plaques. No aneurysmal dilatation or dissection. Celiac: Widely patent SMA: Widely patent Renals: Widely patent single BILATERAL renal arteries IMA: Patent Inflow: Normal appearance, patent Veins: Unopacified at time of imaging Review of the MIP images confirms the above findings. NON-VASCULAR Hepatobiliary: 11 mm LEFT lobe hepatic cyst. Liver otherwise normal appearance. Gallbladder surgically absent. Pancreas: Normal appearance Spleen: Normal appearance Adrenals/Urinary Tract: Adrenal glands, kidneys, ureters, and bladder normal appearance Stomach/Bowel: Normal appendix. Mild distal colonic diverticulosis without evidence of diverticulitis. Sigmoid anastomotic staple line  noted. Stomach and remaining bowel loops unremarkable. Lymphatic: No adenopathy Reproductive: Unremarkable prostate gland and seminal vesicles Other: RIGHT inguinal hernia containing fat. No free air or free fluid. No inflammatory process. Musculoskeletal: Interspinous prosthesis L4-L5. No acute osseous findings. Review of the MIP images confirms the above findings. IMPRESSION: No evidence of aortic aneurysm or dissection. Mild distal colonic diverticulosis without evidence of diverticulitis. RIGHT inguinal hernia containing fat. No acute intrathoracic, intra-abdominal, or intrapelvic abnormalities. Electronically Signed  By: Lavonia Dana M.D.   On: 06/03/2022 11:48   CT HEAD WO CONTRAST (5MM)  Result Date: 06/03/2022 CLINICAL DATA:  Provided history: Neuro deficit, acute, stroke suspected. EXAM: CT HEAD WITHOUT CONTRAST TECHNIQUE: Contiguous axial images were obtained from the base of the skull through the vertex without intravenous contrast. RADIATION DOSE REDUCTION: This exam was performed according to the departmental dose-optimization program which includes automated exposure control, adjustment of the mA and/or kV according to patient size and/or use of iterative reconstruction technique. COMPARISON:  No pertinent prior exams available for comparison. FINDINGS: Brain: No age advanced or lobar predominant parenchymal atrophy. Partially empty sella turcica, a nonspecific finding. There is no acute intracranial hemorrhage. No demarcated cortical infarct. No extra-axial fluid collection. No evidence of an intracranial mass. No midline shift. Vascular: No hyperdense vessel. Skull: No fracture or aggressive osseous lesion. Sinuses/Orbits: No mass or acute finding within the imaged orbits. No significant paranasal sinus disease at the imaged levels. IMPRESSION: No evidence of acute intracranial abnormality. Electronically Signed   By: Kellie Simmering D.O.   On: 06/03/2022 11:47   DG Chest 2 View  Result Date:  06/03/2022 CLINICAL DATA:  Chest pain. EXAM: CHEST - 2 VIEW COMPARISON:  April 07, 2021. FINDINGS: The heart size and mediastinal contours are within normal limits. Both lungs are clear. The visualized skeletal structures are unremarkable. IMPRESSION: No active cardiopulmonary disease. Electronically Signed   By: Marijo Conception M.D.   On: 06/03/2022 11:29     Independent interpretation of notes and tests performed by another provider:   None  Procedures performed:   None  Pertinent History, Exam, Impression, and Recommendations:   Problem List Items Addressed This Visit       Cardiovascular and Mediastinum   Hypertension - Primary    Patient presents for hospital follow-up regarding hypertensive emergency following initial clinical course of ER visit at [facility] on 06/03/2022, blood pressure had improved by released from ER with close follow-up advised.  He then presented to our office on 06/04/2022 with significantly elevated readings, blurred vision, EMS transported patient to [facility] where he was admitted from 06/04/2022 and released on 06/06/2022.  Had recurrent episode of severe headache and return to ER at [facility] on 06/10/2022, discharged in stable condition.  He has been compliant on medication regimen and has noted improved BP readings, does have upcoming visits with cardiology and neurology.  Cardiopulmonary findings today are reassuring with positive S1-S2, regular rate and rhythm, no additional heart sounds, lung fields are clear without bibasilar rales, no peripheral edema and symmetric pulses are noted.  In addition to medication compliance, encourage compliance with CPAP, tobacco cessation.  He is to follow-up with cardiology and neurology.      Relevant Medications   prazosin (MINIPRESS) 1 MG capsule     Respiratory   Obstructive sleep apnea of adult    OSA with CPAP, compliance encouraged given recent hospital admission.        Other   Anxiety state   Relevant Medications    ARIPiprazole (ABILIFY) 15 MG tablet   Other Relevant Orders   Ambulatory referral to Psychiatry   Diverticulitis of colon (without mention of hemorrhage)(562.11)   Relevant Medications   prazosin (MINIPRESS) 1 MG capsule   PTSD (post-traumatic stress disorder)   Relevant Orders   Ambulatory referral to Psychiatry   Depression with anxiety    Chronic condition, has been established with psychiatry, provider is leaving group.  Referral placed to a patient seeking  new group, will refill medications over interim short-term.      Relevant Orders   Ambulatory referral to Psychiatry   Tobacco abuse    We discussed tobacco cessation, has had benefit from Chantix in the past, will send new prescription.  We discussed tobacco cessation for 4 minutes.      Relevant Medications   Varenicline Tartrate, Starter, (CHANTIX STARTING MONTH PAK) 0.5 MG X 11 & 1 MG X 42 TBPK   Other Visit Diagnoses     Insomnia, unspecified type       Relevant Medications   zaleplon (SONATA) 5 MG capsule        Orders & Medications Meds ordered this encounter  Medications   ARIPiprazole (ABILIFY) 15 MG tablet    Sig: Take 1 tablet (15 mg total) by mouth at bedtime.    Dispense:  30 tablet    Refill:  0   zaleplon (SONATA) 5 MG capsule    Sig: Take 1 capsule (5 mg total) by mouth at bedtime. May take another in the middle of the night as needed.    Dispense:  30 capsule    Refill:  0   benztropine (COGENTIN) 1 MG tablet    Sig: Take 1 tablet (1 mg total) by mouth 2 (two) times daily.    Dispense:  60 tablet    Refill:  1   prazosin (MINIPRESS) 1 MG capsule    Sig: TAKE 1 CAPSULE(1 MG) BY MOUTH TWICE DAILY    Dispense:  60 capsule    Refill:  5   methylphenidate (RITALIN) 5 MG tablet    Sig: Take 1 tablet (5 mg total) by mouth 2 (two) times daily with breakfast and lunch.    Dispense:  60 tablet    Refill:  0   Varenicline Tartrate, Starter, (CHANTIX STARTING MONTH PAK) 0.5 MG X 11 & 1 MG X 42 TBPK     Sig: Take one 0.5 mg tablet by mouth once daily for 3 days, then increase to one 0.5 mg tablet twice daily for 4 days, then increase to one 1 mg tablet twice daily.    Dispense:  1 each    Refill:  0   Orders Placed This Encounter  Procedures   Ambulatory referral to Psychiatry     No follow-ups on file.     [name], MD   Primary Care Sports Medicine [place]

## 2022-06-13 ENCOUNTER — Encounter: Payer: Self-pay | Admitting: Family Medicine

## 2022-06-13 NOTE — Assessment & Plan Note (Signed)
Chronic condition, has been established with psychiatry, provider is leaving group.  Referral placed to a patient seeking new group, will refill medications over interim short-term.

## 2022-06-13 NOTE — Assessment & Plan Note (Signed)
Patient presents for hospital follow-up regarding hypertensive emergency following initial clinical course of ER visit at [facility] on 06/03/2022, blood pressure had improved by released from ER with close follow-up advised.  He then presented to our office on 06/04/2022 with significantly elevated readings, blurred vision, EMS transported patient to [facility] where he was admitted from 06/04/2022 and released on 06/06/2022.  Had recurrent episode of severe headache and return to ER at [facility] on 06/10/2022, discharged in stable condition.  He has been compliant on medication regimen and has noted improved BP readings, does have upcoming visits with cardiology and neurology.  Cardiopulmonary findings today are reassuring with positive S1-S2, regular rate and rhythm, no additional heart sounds, lung fields are clear without bibasilar rales, no peripheral edema and symmetric pulses are noted.  In addition to medication compliance, encourage compliance with CPAP, tobacco cessation.  He is to follow-up with cardiology and neurology.

## 2022-06-13 NOTE — Assessment & Plan Note (Signed)
OSA with CPAP, compliance encouraged given recent hospital admission.

## 2022-06-13 NOTE — Assessment & Plan Note (Signed)
We discussed tobacco cessation, has had benefit from Chantix in the past, will send new prescription.  We discussed tobacco cessation for 4 minutes.

## 2022-07-14 ENCOUNTER — Emergency Department: Payer: Managed Care, Other (non HMO)

## 2022-07-14 ENCOUNTER — Other Ambulatory Visit: Payer: Self-pay

## 2022-07-14 ENCOUNTER — Emergency Department
Admission: EM | Admit: 2022-07-14 | Discharge: 2022-07-14 | Disposition: A | Discharge status: Home / Self Care | Payer: Managed Care, Other (non HMO) | Attending: Emergency Medicine | Admitting: Emergency Medicine

## 2022-07-14 DIAGNOSIS — R1032 Left lower quadrant pain: Secondary | ICD-10-CM | POA: Diagnosis present

## 2022-07-14 DIAGNOSIS — R197 Diarrhea, unspecified: Secondary | ICD-10-CM | POA: Insufficient documentation

## 2022-07-14 DIAGNOSIS — R0682 Tachypnea, not elsewhere classified: Secondary | ICD-10-CM | POA: Insufficient documentation

## 2022-07-14 DIAGNOSIS — E039 Hypothyroidism, unspecified: Secondary | ICD-10-CM | POA: Diagnosis not present

## 2022-07-14 DIAGNOSIS — F17200 Nicotine dependence, unspecified, uncomplicated: Secondary | ICD-10-CM | POA: Diagnosis not present

## 2022-07-14 DIAGNOSIS — R000 Tachycardia, unspecified: Secondary | ICD-10-CM | POA: Insufficient documentation

## 2022-07-14 DIAGNOSIS — I10 Essential (primary) hypertension: Secondary | ICD-10-CM | POA: Diagnosis not present

## 2022-07-14 DIAGNOSIS — R112 Nausea with vomiting, unspecified: Secondary | ICD-10-CM | POA: Diagnosis not present

## 2022-07-14 LAB — BASIC METABOLIC PANEL
Anion gap: 7 (ref 5–15)
BUN: 18 mg/dL (ref 6–20)
CO2: 21 mmol/L — ABNORMAL LOW (ref 22–32)
Calcium: 9.8 mg/dL (ref 8.9–10.3)
Chloride: 106 mmol/L (ref 98–111)
Creatinine, Ser: 0.85 mg/dL (ref 0.61–1.24)
GFR, Estimated: >60 mL/min (ref >60)
Glucose, Bld: 106 mg/dL — ABNORMAL HIGH (ref 70–99)
Potassium: 3.8 mmol/L (ref 3.5–5.1)
Sodium: 134 mmol/L — ABNORMAL LOW (ref 135–145)

## 2022-07-14 LAB — URINALYSIS, ROUTINE W REFLEX MICROSCOPIC
Bilirubin Urine: NEGATIVE
Glucose, UA: NEGATIVE mg/dL
Hgb urine dipstick: NEGATIVE
Ketones, ur: 5 mg/dL — AB
Leukocytes,Ua: NEGATIVE
Nitrite: NEGATIVE
Protein, ur: NEGATIVE mg/dL
Specific Gravity, Urine: 1.039 — ABNORMAL HIGH (ref 1.005–1.030)
pH: 5 (ref 5.0–8.0)

## 2022-07-14 LAB — CBC
HCT: 52.1 % — ABNORMAL HIGH (ref 39.0–52.0)
Hemoglobin: 18.4 g/dL — ABNORMAL HIGH (ref 13.0–17.0)
MCH: 30.5 pg (ref 26.0–34.0)
MCHC: 35.3 g/dL (ref 30.0–36.0)
MCV: 86.4 fL (ref 80.0–100.0)
Platelets: 234 10*3/uL (ref 150–400)
RBC: 6.03 MIL/uL — ABNORMAL HIGH (ref 4.22–5.81)
RDW: 12 % (ref 11.5–15.5)
WBC: 8.9 10*3/uL (ref 4.0–10.5)
nRBC: 0 % (ref 0.0–0.2)

## 2022-07-14 LAB — TROPONIN I (HIGH SENSITIVITY): Troponin I (High Sensitivity): <2 ng/L (ref <18)

## 2022-07-14 LAB — LIPASE, BLOOD: Lipase: 44 U/L (ref 11–51)

## 2022-07-14 MED ORDER — ONDANSETRON 4 MG PO TBDP: 4.0000 mg | ORAL_TABLET | Freq: Three times a day (TID) | ORAL | 0 refills | Status: AC | PRN

## 2022-07-14 MED ORDER — MORPHINE SULFATE (PF) 4 MG/ML IV SOLN
4.0000 mg | Freq: Once | INTRAVENOUS | Status: AC
  Administered 2022-07-14: 4 mg via INTRAVENOUS
  Filled 2022-07-14: qty 1

## 2022-07-14 MED ORDER — PANTOPRAZOLE SODIUM 40 MG PO TBEC: 40.0000 mg | DELAYED_RELEASE_TABLET | Freq: Every day | ORAL | 0 refills | Status: DC

## 2022-07-14 MED ORDER — IOHEXOL 300 MG/ML  SOLN
100.0000 mL | Freq: Once | INTRAMUSCULAR | Status: AC | PRN
  Administered 2022-07-14: 100 mL via INTRAVENOUS

## 2022-07-14 MED ORDER — ONDANSETRON HCL 4 MG/2ML IJ SOLN
4.0000 mg | Freq: Once | INTRAMUSCULAR | Status: AC
  Administered 2022-07-14: 4 mg via INTRAVENOUS
  Filled 2022-07-14: qty 2

## 2022-07-14 MED ORDER — LACTATED RINGERS IV BOLUS
1000.0000 mL | Freq: Once | INTRAVENOUS | Status: AC
  Administered 2022-07-14: 1000 mL via INTRAVENOUS

## 2022-07-14 MED ORDER — METOCLOPRAMIDE HCL 5 MG/ML IJ SOLN
10.0000 mg | Freq: Once | INTRAMUSCULAR | Status: AC
  Administered 2022-07-14: 10 mg via INTRAVENOUS
  Filled 2022-07-14: qty 2

## 2022-07-14 MED ORDER — ONDANSETRON HCL 4 MG/2ML IJ SOLN
INTRAMUSCULAR | Status: AC
  Filled 2022-07-14: qty 2

## 2022-07-14 MED ORDER — MORPHINE SULFATE (PF) 4 MG/ML IV SOLN
INTRAVENOUS | Status: AC
  Filled 2022-07-14: qty 1

## 2022-07-14 NOTE — Discharge Instructions (Addendum)
Your CAT scan and blood work are reassuring.  We did not find an exact explanation for pain.  Continue to take Tylenol Motrin for pain.  You can take the Protonix for acid reflux and Zofran for vomiting.

## 2022-07-14 NOTE — ED Provider Notes (Signed)
[facility] Provider Note    Event Date/Time   First MD Initiated Contact with Patient 07/14/22 1111     (approximate)   History   Abdominal Pain (Left)   HPI  Mark Austin is a 41 y.o. male past medical history hypertension PTSD history of bowel resection secondary to perforated diverticulitis presents left lower quadrant pain.  Symptoms going on for about a week and a half.  Initially was intermittent and not that bad this been worse over the last several days.  He is also had about a week of nausea vomiting diarrhea inability tolerate p.o.  Has copious loose stool no blood essentially has bowel movement every time he drinks anything.  Denies chest pain back pain shortness of breath.  No urinary symptoms.  No fevers or chills but has had some sweating.     Past Medical History:  Diagnosis Date   Anxiety    Back ache    Depression    Diverticulitis    GERD (gastroesophageal reflux disease)    History of kidney stones    Hypertension    Inguinal hernia    PTSD (post-traumatic stress disorder)    Seizure ([place])    over 20 yrs ago.  related to medications    Patient Active Problem List   Diagnosis Date Noted   Hypertensive emergency 06/04/2022   Vision loss 06/04/2022   HLD (hyperlipidemia) 06/04/2022   Hypothyroidism 06/04/2022   Depression with anxiety 06/04/2022   Tobacco abuse 06/04/2022   Leukocytosis 06/04/2022   Diarrhea 06/04/2022   Abdominal pain 06/04/2022   Overweight 06/04/2022   Scrotal pain, left 03/03/2022   Elevated sed rate 10/30/2021   Abnormal drug screen (09/12/2021) 10/30/2021   Marijuana use 10/30/2021   Elevated urine levels of catecholamines 10/01/2021   Chronic pain syndrome 09/12/2021   Pharmacologic therapy 09/12/2021   Disorder of skeletal system 09/12/2021   Problems influencing health status 09/12/2021   Abnormal MRI, lumbar spine (09/03/2021) 09/12/2021   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, L5-S1) 09/12/2021   Lumbar facet arthropathy (L4-5) (Bilateral) 09/12/2021   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) 09/12/2021   Lumbosacral lateral recess stenosis (Right: L5-S1) 09/12/2021   Lumbar intervertebral disc protrusion (L4-5, L5-S1) 09/12/2021   Urinary and fecal incontinence (09/03/2021) 09/12/2021    Class: History of   Failed spinal cord stimulator, 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   Chronic groin pain (Left) 09/12/2021   Chronic hip pain (3ry area of Pain) (Bilateral) (L>R) 09/12/2021   Chronic knee pain (4th area of Pain) (intermittent) (Bilateral) 09/12/2021   Discogenic low back pain (L4-5, L5-S1) 09/12/2021   Lumbar facet joint syndrome 09/12/2021   Primary osteoarthritis of knees, bilateral 07/24/2021   History of lumbar surgery 07/24/2021   Annual physical exam 07/11/2021   Restless legs 07/11/2021   Chronic lower extremity pain (2ry area of Pain) (Bilateral) (L>R) 07/11/2021   Male erectile disorder 05/29/2021   Obstructive sleep apnea of adult 05/29/2021   PTSD (post-traumatic stress disorder) 05/29/2021   Hypogonadism male 05/29/2021   Chronic low back pain (1ry area of Pain) (Midline) w/ sciatica (Bilateral) 05/29/2021   Seizure-like activity ([place]) 05/14/2021   Diverticulitis of colon (without mention of hemorrhage)(562.11)    Polyp of sigmoid colon    Sleep apnea 03/07/2021   Decreased hearing of right ear 03/07/2021   Hypertension 03/07/2021   Chronic abdominal pain 03/07/2021   Migraine headache 03/07/2021  Myalgia and myositis 03/07/2021   Anxiety state 03/07/2021   TIA (transient ischemic attack) 11/25/2016   Constipation 08/22/2016     Physical Exam  Triage Vital Signs: ED Triage Vitals [07/14/22 0917]  Enc Vitals Group     BP (!) 164/113     Pulse Rate (!) 101     Resp (!) 22     Temp 99 F (37.2 C)     Temp Source Oral     SpO2 97 %     Weight 215 lb (97.5 kg)     Height 5'  10" (1.778 m)     Head Circumference      Peak Flow      Pain Score 7     Pain Loc      Pain Edu?      Excl. in [place]?     Most recent vital signs: Vitals:   07/14/22 0917 07/14/22 1124  BP: (!) 164/113 (!) 169/109  Pulse: (!) 101 78  Resp: (!) 22 20  Temp: 99 F (37.2 C) 98.3 F (36.8 C)  SpO2: 97% 97%     General: Awake, patient looks uncomfortable and is tearful CV:  Good peripheral perfusion.  Resp:  Normal effort.  Abd:  No distention.  Abdomen is soft there is tenderness in the left lower quadrant but no guarding Neuro:             Awake, Alert, Oriented x 3  Other:     ED Results / Procedures / Treatments  Labs (all labs ordered are listed, but only abnormal results are displayed) Labs Reviewed  BASIC METABOLIC PANEL - Abnormal; Notable for the following components:      Result Value   Sodium 134 (*)    CO2 21 (*)    Glucose, Bld 106 (*)    All other components within normal limits  CBC - Abnormal; Notable for the following components:   RBC 6.03 (*)    Hemoglobin 18.4 (*)    HCT 52.1 (*)    All other components within normal limits  URINALYSIS, ROUTINE W REFLEX MICROSCOPIC - Abnormal; Notable for the following components:   Color, Urine YELLOW (*)    APPearance CLEAR (*)    Specific Gravity, Urine 1.039 (*)    Ketones, ur 5 (*)    All other components within normal limits  LIPASE, BLOOD  TROPONIN I (HIGH SENSITIVITY)     EKG   RADIOLOGY EKG shows sinus tach with LVH repolarization abnormality no significant changes from prior   PROCEDURES:  Critical Care performed: No  Procedures  The patient is on the cardiac monitor to evaluate for evidence of arrhythmia and/or significant heart rate changes.   MEDICATIONS ORDERED IN ED: Medications  lactated ringers bolus 1,000 mL (0 mLs Intravenous Stopped 07/14/22 1251)  ondansetron (ZOFRAN) injection 4 mg (4 mg Intravenous Not Given 07/14/22 1231)  morphine (PF) 4 MG/ML injection 4 mg (4 mg  Intravenous Not Given 07/14/22 1230)  iohexol (OMNIPAQUE) 300 MG/ML solution 100 mL (100 mLs Intravenous Contrast Given 07/14/22 1259)  morphine (PF) 4 MG/ML injection 4 mg (4 mg Intravenous Given 07/14/22 1454)  metoCLOPramide (REGLAN) injection 10 mg (10 mg Intravenous Given 07/14/22 1454)     IMPRESSION / MDM / ASSESSMENT AND PLAN / ED COURSE  I reviewed the triage vital signs and the nursing notes.  Patient's presentation is most consistent with acute presentation with potential threat to life or bodily function.  Differential diagnosis includes, but is not limited to, reticulitis, diverticulitis complication bleeding perforation, abscess, colitis, atypical pancreatitis or appendicitis, UTI, kidney stone  Patient is a 41 year old male with prior history of diverticulitis that has perforated requiring colectomy who presents with left lower quadrant pain.  Symptoms are going on for about a week and a half but have acutely worsened over the last several days.  He is also had significant amount of nausea vomiting and diarrhea.  Denies fevers but has had sweating no urinary symptoms no chest or shortness of breath.  He is hypertensive mildly tachypneic and tachycardic.  Patient looks uncomfortable and is tearful.  His abdominal exam overall is benign nonperitoneal but he is tender in the left lower quadrant.  Labs overall reassuring no leukocytosis normal lipase.  Plan to obtain CT abdomen pelvis to rule out complicated diverticulitis or other acute surgical pathology.  Will give fluids Zofran and morphine.    CT of the abdomen pelvis is no acute findings.  Patient feeling improved but continues to have pain.  Did give a second dose of morphine after which she felt much better tolerating p.o.  Urine is clean.  Patient did request medication for acid reflux and I will prescribe him some Zofran.  Discussed return precautions and PCP follow-up.   FINAL CLINICAL  IMPRESSION(S) / ED DIAGNOSES   Final diagnoses:  LLQ abdominal pain     Rx / DC Orders   ED Discharge Orders          Ordered    ondansetron (ZOFRAN-ODT) 4 MG disintegrating tablet  Every 8 hours PRN        07/14/22 1537    pantoprazole (PROTONIX) 40 MG tablet  Daily        07/14/22 1537             Note:  This document was prepared using Dragon voice recognition software and may include unintentional dictation errors.   Rada Hay, MD 07/14/22 1537

## 2022-07-14 NOTE — ED Triage Notes (Signed)
Pt states abdominal pain for 1 week. Pt states it has been getting worse. Pt complaining of N/V/D with the pain. Pt states history of diverticulitis.

## 2022-07-15 ENCOUNTER — Ambulatory Visit (INDEPENDENT_AMBULATORY_CARE_PROVIDER_SITE_OTHER): Payer: Managed Care, Other (non HMO) | Admitting: Family Medicine

## 2022-07-15 ENCOUNTER — Inpatient Hospital Stay: Payer: Managed Care, Other (non HMO) | Admitting: Family Medicine

## 2022-07-15 ENCOUNTER — Encounter: Payer: Self-pay | Admitting: Family Medicine

## 2022-07-15 VITALS — BP 144/94 | HR 82 | Ht 70.0 in | Wt 215.0 lb

## 2022-07-15 DIAGNOSIS — K5732 Diverticulitis of large intestine without perforation or abscess without bleeding: Secondary | ICD-10-CM | POA: Diagnosis not present

## 2022-07-15 DIAGNOSIS — F411 Generalized anxiety disorder: Secondary | ICD-10-CM

## 2022-07-15 DIAGNOSIS — G4700 Insomnia, unspecified: Secondary | ICD-10-CM | POA: Diagnosis not present

## 2022-07-15 DIAGNOSIS — H9311 Tinnitus, right ear: Secondary | ICD-10-CM | POA: Insufficient documentation

## 2022-07-15 MED ORDER — METHYLPHENIDATE HCL 5 MG PO TABS: 5.0000 mg | ORAL_TABLET | Freq: Two times a day (BID) | ORAL | 0 refills | Status: AC

## 2022-07-15 MED ORDER — ARIPIPRAZOLE 15 MG PO TABS: 15.0000 mg | ORAL_TABLET | Freq: Every evening | ORAL | 0 refills | Status: DC

## 2022-07-15 MED ORDER — SUMATRIPTAN SUCCINATE 100 MG PO TABS: 100.0000 mg | ORAL_TABLET | Freq: Once | ORAL | 0 refills | Status: AC | PRN

## 2022-07-15 MED ORDER — BENZTROPINE MESYLATE 1 MG PO TABS: 1.0000 mg | ORAL_TABLET | Freq: Two times a day (BID) | ORAL | 0 refills | Status: AC

## 2022-07-15 MED ORDER — PRAZOSIN HCL 1 MG PO CAPS: ORAL_CAPSULE | ORAL | 0 refills | Status: AC

## 2022-07-15 MED ORDER — ZALEPLON 5 MG PO CAPS: 5.0000 mg | ORAL_CAPSULE | Freq: Every evening | ORAL | 0 refills | Status: DC

## 2022-07-15 NOTE — Patient Instructions (Signed)
-   Maintain follow-up with psychiatry as scheduled - Referral coordinator will contact to schedule visit with ENT - We will await paperwork - Return late January 2024 timeframe

## 2022-07-15 NOTE — Progress Notes (Signed)
Primary Care / Sports Medicine Office Visit  Patient Information:  Patient ID: Mark Austin, male DOB: [birth year] Age: 41 y.o. MRN: [id number]   Mark Austin is a pleasant 41 y.o. male presenting with the following:  Chief Complaint  Patient presents with   Hospitalization Follow-up    Stomach still bothering him.     Vitals:   07/15/22 1008 07/15/22 1011  BP: (!) 142/94 (!) 144/94  Pulse: 82   SpO2: 97%    Vitals:   07/15/22 1008  Weight: 215 lb (97.5 kg)  Height: '5\' 10"'$  (1.778 m)   Body mass index is 30.85 kg/m.     Independent interpretation of notes and tests performed by another provider:   None  Procedures performed:   None  Pertinent History, Exam, Impression, and Recommendations:   Problem List Items Addressed This Visit       Other   Anxiety state - Primary    Returns for follow-up, severe symptoms with associated significant hypertension necessitating multiple ER visits in the recent past and hospitalization.  Does have upcoming establishment of care with psychiatry on 08/15/2021.  Will fill associated medications to last until that visit, PDMP reviewed.  Additionally, short-term FMLA paperwork to bridge Between now and psychiatry visit to be completed.  Outside notes reviewed.      Relevant Medications   ARIPiprazole (ABILIFY) 15 MG tablet   Diverticulitis of colon (without mention of hemorrhage)(562.11)    ER visit yesterday but severe abdominal pain, treated with fluids, Zofran, morphine for pain, stable.  Outside ER note, associated labs, and imaging results reviewed.      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      Relevant Orders   Ambulatory referral to ENT   Other Visit Diagnoses     Insomnia, unspecified type       Relevant Medications   zaleplon (SONATA) 5 MG  capsule        Orders & Medications Meds ordered this encounter  Medications   ARIPiprazole (ABILIFY) 15 MG tablet    Sig: Take 1 tablet (15 mg total) by mouth at bedtime.    Dispense:  30 tablet    Refill:  0   benztropine (COGENTIN) 1 MG tablet    Sig: Take 1 tablet (1 mg total) by mouth 2 (two) times daily.    Dispense:  60 tablet    Refill:  0   methylphenidate (RITALIN) 5 MG tablet    Sig: Take 1 tablet (5 mg total) by mouth 2 (two) times daily with breakfast and lunch.    Dispense:  60 tablet    Refill:  0   prazosin (MINIPRESS) 1 MG capsule    Sig: TAKE 1 CAPSULE(1 MG) BY MOUTH TWICE DAILY    Dispense:  60 capsule    Refill:  0   SUMAtriptan (IMITREX) 100 MG tablet    Sig: Take 1 tablet (100 mg total) by mouth once as needed for migraine. May repeat in 2 hours if headache persists or recurs.    Dispense:  10 tablet    Refill:  0   zaleplon (SONATA) 5 MG capsule    Sig: Take 1 capsule (5 mg total) by mouth at bedtime. May take another in the middle of the night as needed.    Dispense:  30  capsule    Refill:  0   Orders Placed This Encounter  Procedures   Ambulatory referral to ENT     Return in about 1 month (around 08/16/2022).     [name], MD, [facility]   Primary Care Sports Medicine Primary Care and Sports Medicine at [facility]

## 2022-07-15 NOTE — Telephone Encounter (Signed)
Fyi more messages

## 2022-07-15 NOTE — Assessment & Plan Note (Signed)
ER visit yesterday but severe abdominal pain, treated with fluids, Zofran, morphine for pain, stable.  Outside ER note, associated labs, and imaging results reviewed.

## 2022-07-15 NOTE — Assessment & Plan Note (Signed)
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

## 2022-07-15 NOTE — Assessment & Plan Note (Signed)
Returns for follow-up, severe symptoms with associated significant hypertension necessitating multiple ER visits in the recent past and hospitalization.  Does have upcoming establishment of care with psychiatry on 08/15/2021.  Will fill associated medications to last until that visit, PDMP reviewed.  Additionally, short-term FMLA paperwork to bridge Between now and psychiatry visit to be completed.  Outside notes reviewed.

## 2022-07-17 ENCOUNTER — Encounter: Payer: Self-pay | Admitting: Family Medicine

## 2022-07-17 ENCOUNTER — Other Ambulatory Visit: Payer: Self-pay | Admitting: Family Medicine

## 2022-07-17 MED ORDER — VARENICLINE TARTRATE 1 MG PO TABS: 1.0000 mg | ORAL_TABLET | Freq: Two times a day (BID) | ORAL | 3 refills | Status: AC

## 2022-07-17 NOTE — Telephone Encounter (Signed)
RX? What dose?

## 2022-07-18 ENCOUNTER — Other Ambulatory Visit: Payer: Self-pay | Admitting: Family Medicine

## 2022-07-18 DIAGNOSIS — F411 Generalized anxiety disorder: Secondary | ICD-10-CM

## 2022-07-18 NOTE — Telephone Encounter (Signed)
Requested medication (s) are due for refill today - no  Requested medication (s) are on the active medication list -yes  Future visit scheduled -yes  Last refill: 07/15/22 #30  Notes to clinic: non delegated Rx  Requested Prescriptions  Pending Prescriptions Disp Refills   ARIPiprazole (ABILIFY) 15 MG tablet [Pharmacy Med Name: ARIPIPRAZOLE 15MG (FIFTEEN MG) TABS] 30 tablet 0    Sig: TAKE 1 TABLET(15 MG) BY MOUTH AT BEDTIME     Not Delegated - Psychiatry:  Antipsychotics - Second Generation (Atypical) - aripiprazole Failed - 07/18/2022  3:10 AM      Failed - This refill cannot be delegated      Failed - TSH in normal range and within 360 days    TSH  Date Value Ref Range Status  07/19/2021 0.894 0.450 - 4.500 uIU/mL Final         Failed - Last BP in normal range    BP Readings from Last 1 Encounters:  07/15/22 (!) 144/94         Failed - Lipid Panel in normal range within the last 12 months    Cholesterol, Total  Date Value Ref Range Status  07/19/2021 180 100 - 199 mg/dL Final   LDL Chol Calc (NIH)  Date Value Ref Range Status  07/19/2021 123 (H) 0 - 99 mg/dL Final   HDL  Date Value Ref Range Status  07/19/2021 39 (L) >39 mg/dL Final   Triglycerides  Date Value Ref Range Status  07/19/2021 95 0 - 149 mg/dL Final         Passed - Completed PHQ-2 or PHQ-9 in the last 360 days      Passed - Last Heart Rate in normal range    Pulse Readings from Last 1 Encounters:  07/15/22 82         Passed - Valid encounter within last 6 months    Recent Outpatient Visits           3 days ago [place] and Sports Medicine at [place], Earley Abide, MD   1 month ago Hypertension, unspecified type   [place] [facility] and [facility] at [place], Earley Abide, MD   1 month ago Hypertension, unspecified type   [facility] Primary Care and Sports Medicine at [place], Earley Abide, MD   9 months  ago Right hand pain   [place] [facility] and Sports Medicine at [place], Earley Abide, MD   11 months ago Primary osteoarthritis of knees, bilateral   [place] [facility] and Sports Medicine at [facility], Earley Abide, MD       Future Appointments             In 4 weeks Zigmund Daniel Earley Abide, MD [facility] Primary Care and Sports Medicine at [facility], [place] within normal limits and completed in the last 12 months    WBC  Date Value Ref Range Status  07/14/2022 8.9 4.0 - 10.5 K/uL Final   RBC  Date Value Ref Range Status  07/14/2022 6.03 (H) 4.22 - 5.81 MIL/uL Final   Hemoglobin  Date Value Ref Range Status  07/14/2022 18.4 (H) 13.0 - 17.0 g/dL Final  07/19/2021 17.9 (H) 13.0 - 17.7 g/dL Final   HCT  Date Value Ref Range Status  07/14/2022 52.1 (H) 39.0 -  52.0 % Final   Hematocrit  Date Value Ref Range Status  07/19/2021 52.6 (H) 37.5 - 51.0 % Final   MCHC  Date Value Ref Range Status  07/14/2022 35.3 30.0 - 36.0 g/dL Final   [facility]  Date Value Ref Range Status  07/14/2022 30.5 26.0 - 34.0 pg Final   MCV  Date Value Ref Range Status  07/14/2022 86.4 80.0 - 100.0 fL Final  07/19/2021 90 79 - 97 fL Final   No results found for: "PLTCOUNTKUC", "LABPLAT", "POCPLA" RDW  Date Value Ref Range Status  07/14/2022 12.0 11.5 - 15.5 % Final  07/19/2021 11.7 11.6 - 15.4 % Final         Passed - CMP within normal limits and completed in the last 12 months    Albumin  Date Value Ref Range Status  06/04/2022 4.5 3.5 - 5.0 g/dL Final  07/19/2021 4.6 4.0 - 5.0 g/dL Final   Alkaline Phosphatase  Date Value Ref Range Status  06/04/2022 55 38 - 126 U/L Final   ALT  Date Value Ref Range Status  06/04/2022 20 0 - 44 U/L Final   AST  Date Value Ref Range Status  06/04/2022 25 15 - 41 U/L Final   BUN  Date Value Ref Range Status  07/14/2022 18 6 - 20 mg/dL Final  07/19/2021 10 6 - 24 mg/dL Final    Calcium  Date Value Ref Range Status  07/14/2022 9.8 8.9 - 10.3 mg/dL Final   CO2  Date Value Ref Range Status  07/14/2022 21 (L) 22 - 32 mmol/L Final   Creatinine, Ser  Date Value Ref Range Status  07/14/2022 0.85 0.61 - 1.24 mg/dL Final   Glucose  Date Value Ref Range Status  06/19/2021 103  Final   Glucose, Bld  Date Value Ref Range Status  07/14/2022 106 (H) 70 - 99 mg/dL Final    Comment:    Glucose reference range applies only to samples taken after fasting for at least 8 hours.   Glucose-Capillary  Date Value Ref Range Status  10/22/2020 96 70 - 99 mg/dL Final    Comment:    Glucose reference range applies only to samples taken after fasting for at least 8 hours.   Potassium  Date Value Ref Range Status  07/14/2022 3.8 3.5 - 5.1 mmol/L Final   Sodium  Date Value Ref Range Status  07/14/2022 134 (L) 135 - 145 mmol/L Final  07/19/2021 137 134 - 144 mmol/L Final   Total Bilirubin  Date Value Ref Range Status  06/04/2022 1.5 (H) 0.3 - 1.2 mg/dL Final   Bilirubin Total  Date Value Ref Range Status  07/19/2021 0.5 0.0 - 1.2 mg/dL Final   Bilirubin, Total  Date Value Ref Range Status  06/18/2021 0.4  Final   Bilirubin, Direct  Date Value Ref Range Status  06/04/2022 0.2 0.0 - 0.2 mg/dL Final   Indirect Bilirubin  Date Value Ref Range Status  06/04/2022 1.3 (H) 0.3 - 0.9 mg/dL Final    Comment:    Performed at [facility], [address] [place], [place] [address]   Protein, ur  Date Value Ref Range Status  07/14/2022 NEGATIVE NEGATIVE mg/dL Final   Total Protein  Date Value Ref Range Status  06/04/2022 8.0 6.5 - 8.1 g/dL Final  07/19/2021 7.2 6.0 - 8.5 g/dL Final   GFR calc Af Amer  Date Value Ref Range Status  02/18/2020 >60 >60 mL/min Final   eGFR  Date  Value Ref Range Status  07/19/2021 106 >59 mL/min/1.73 Final   GFR, Estimated  Date Value Ref Range Status  07/14/2022 >60 >60 mL/min Final    Comment:     (NOTE) Calculated using the CKD-EPI Creatinine Equation (2021)             Requested Prescriptions  Pending Prescriptions Disp Refills   ARIPiprazole (ABILIFY) 15 MG tablet [Pharmacy Med Name: ARIPIPRAZOLE 15MG (FIFTEEN MG) TABS] 30 tablet 0    Sig: TAKE 1 TABLET(15 MG) BY MOUTH AT BEDTIME     Not Delegated - Psychiatry:  Antipsychotics - Second Generation (Atypical) - aripiprazole Failed - 07/18/2022  3:10 AM      Failed - This refill cannot be delegated      Failed - TSH in normal range and within 360 days    TSH  Date Value Ref Range Status  07/19/2021 0.894 0.450 - 4.500 uIU/mL Final         Failed - Last BP in normal range    BP Readings from Last 1 Encounters:  07/15/22 (!) 144/94         Failed - Lipid Panel in normal range within the last 12 months    Cholesterol, Total  Date Value Ref Range Status  07/19/2021 180 100 - 199 mg/dL Final   LDL Chol Calc (NIH)  Date Value Ref Range Status  07/19/2021 123 (H) 0 - 99 mg/dL Final   HDL  Date Value Ref Range Status  07/19/2021 39 (L) >39 mg/dL Final   Triglycerides  Date Value Ref Range Status  07/19/2021 95 0 - 149 mg/dL Final         Passed - Completed PHQ-2 or PHQ-9 in the last 360 days      Passed - Last Heart Rate in normal range    Pulse Readings from Last 1 Encounters:  07/15/22 82         Passed - Valid encounter within last 6 months    Recent Outpatient Visits           3 days ago [place] and Sports Medicine at [place], Earley Abide, MD   1 month ago Hypertension, unspecified type   [place] [facility] and [facility] Medicine at [place], Earley Abide, MD   1 month ago Hypertension, unspecified type   [facility] Primary Care and Sports Medicine at [place], Earley Abide, MD   9 months ago Right hand pain   [place] Primary [facility] and Sports Medicine at [place], Earley Abide, MD   11 months ago Primary  osteoarthritis of knees, bilateral   [place] [facility] and Sports Medicine at [facility], Earley Abide, MD       Future Appointments             In 4 weeks Zigmund Daniel Earley Abide, MD [facility] Primary Care and Sports Medicine at [facility], [place] within normal limits and completed in the last 12 months    WBC  Date Value Ref Range Status  07/14/2022 8.9 4.0 - 10.5 K/uL Final   RBC  Date Value Ref Range Status  07/14/2022 6.03 (H) 4.22 - 5.81 MIL/uL Final   Hemoglobin  Date Value Ref Range Status  07/14/2022 18.4 (H) 13.0 - 17.0 g/dL Final  07/19/2021 17.9 (H) 13.0 - 17.7 g/dL Final  HCT  Date Value Ref Range Status  07/14/2022 52.1 (H) 39.0 - 52.0 % Final   Hematocrit  Date Value Ref Range Status  07/19/2021 52.6 (H) 37.5 - 51.0 % Final   MCHC  Date Value Ref Range Status  07/14/2022 35.3 30.0 - 36.0 g/dL Final   [facility]  Date Value Ref Range Status  07/14/2022 30.5 26.0 - 34.0 pg Final   MCV  Date Value Ref Range Status  07/14/2022 86.4 80.0 - 100.0 fL Final  07/19/2021 90 79 - 97 fL Final   No results found for: "PLTCOUNTKUC", "LABPLAT", "POCPLA" RDW  Date Value Ref Range Status  07/14/2022 12.0 11.5 - 15.5 % Final  07/19/2021 11.7 11.6 - 15.4 % Final         Passed - CMP within normal limits and completed in the last 12 months    Albumin  Date Value Ref Range Status  06/04/2022 4.5 3.5 - 5.0 g/dL Final  07/19/2021 4.6 4.0 - 5.0 g/dL Final   Alkaline Phosphatase  Date Value Ref Range Status  06/04/2022 55 38 - 126 U/L Final   ALT  Date Value Ref Range Status  06/04/2022 20 0 - 44 U/L Final   AST  Date Value Ref Range Status  06/04/2022 25 15 - 41 U/L Final   BUN  Date Value Ref Range Status  07/14/2022 18 6 - 20 mg/dL Final  07/19/2021 10 6 - 24 mg/dL Final   Calcium  Date Value Ref Range Status  07/14/2022 9.8 8.9 - 10.3 mg/dL Final   CO2  Date Value Ref Range Status  07/14/2022 21 (L) 22 -  32 mmol/L Final   Creatinine, Ser  Date Value Ref Range Status  07/14/2022 0.85 0.61 - 1.24 mg/dL Final   Glucose  Date Value Ref Range Status  06/19/2021 103  Final   Glucose, Bld  Date Value Ref Range Status  07/14/2022 106 (H) 70 - 99 mg/dL Final    Comment:    Glucose reference range applies only to samples taken after fasting for at least 8 hours.   Glucose-Capillary  Date Value Ref Range Status  10/22/2020 96 70 - 99 mg/dL Final    Comment:    Glucose reference range applies only to samples taken after fasting for at least 8 hours.   Potassium  Date Value Ref Range Status  07/14/2022 3.8 3.5 - 5.1 mmol/L Final   Sodium  Date Value Ref Range Status  07/14/2022 134 (L) 135 - 145 mmol/L Final  07/19/2021 137 134 - 144 mmol/L Final   Total Bilirubin  Date Value Ref Range Status  06/04/2022 1.5 (H) 0.3 - 1.2 mg/dL Final   Bilirubin Total  Date Value Ref Range Status  07/19/2021 0.5 0.0 - 1.2 mg/dL Final   Bilirubin, Total  Date Value Ref Range Status  06/18/2021 0.4  Final   Bilirubin, Direct  Date Value Ref Range Status  06/04/2022 0.2 0.0 - 0.2 mg/dL Final   Indirect Bilirubin  Date Value Ref Range Status  06/04/2022 1.3 (H) 0.3 - 0.9 mg/dL Final    Comment:    Performed at [facility], [address] [place], [place] [address]   Protein, ur  Date Value Ref Range Status  07/14/2022 NEGATIVE NEGATIVE mg/dL Final   Total Protein  Date Value Ref Range Status  06/04/2022 8.0 6.5 - 8.1 g/dL Final  07/19/2021 7.2 6.0 - 8.5 g/dL Final   GFR calc Af Amer  Date Value Ref  Range Status  02/18/2020 >60 >60 mL/min Final   eGFR  Date Value Ref Range Status  07/19/2021 106 >59 mL/min/1.73 Final   GFR, Estimated  Date Value Ref Range Status  07/14/2022 >60 >60 mL/min Final    Comment:    (NOTE) Calculated using the CKD-EPI Creatinine Equation (2021)

## 2022-07-23 ENCOUNTER — Encounter: Payer: Self-pay | Admitting: Family Medicine

## 2022-07-24 NOTE — Telephone Encounter (Signed)
Please advise 

## 2022-08-12 ENCOUNTER — Ambulatory Visit: Payer: Managed Care, Other (non HMO) | Admitting: Family Medicine

## 2022-08-15 ENCOUNTER — Encounter: Payer: Self-pay | Admitting: Psychiatry

## 2022-08-15 ENCOUNTER — Ambulatory Visit (INDEPENDENT_AMBULATORY_CARE_PROVIDER_SITE_OTHER): Payer: Managed Care, Other (non HMO) | Admitting: Psychiatry

## 2022-08-15 VITALS — BP 165/115 | HR 86 | Ht 71.0 in | Wt 238.0 lb

## 2022-08-15 DIAGNOSIS — F4310 Post-traumatic stress disorder, unspecified: Secondary | ICD-10-CM

## 2022-08-15 DIAGNOSIS — R41840 Attention and concentration deficit: Secondary | ICD-10-CM | POA: Diagnosis not present

## 2022-08-15 DIAGNOSIS — Z79899 Other long term (current) drug therapy: Secondary | ICD-10-CM

## 2022-08-15 DIAGNOSIS — F1011 Alcohol abuse, in remission: Secondary | ICD-10-CM

## 2022-08-15 DIAGNOSIS — F319 Bipolar disorder, unspecified: Secondary | ICD-10-CM | POA: Insufficient documentation

## 2022-08-15 MED ORDER — ARIPIPRAZOLE 10 MG PO TABS: 5.0000 mg | ORAL_TABLET | Freq: Every day | ORAL | 0 refills | Status: AC

## 2022-08-15 MED ORDER — QUETIAPINE FUMARATE 25 MG PO TABS: 25.0000 mg | ORAL_TABLET | ORAL | 0 refills | Status: AC
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
## 2022-08-15 NOTE — Patient Instructions (Signed)
Quetiapine Tablets What is this medication? QUETIAPINE (kwe TYE a peen) treats schizophrenia and bipolar disorder. It works by balancing the levels of dopamine and serotonin in your brain, hormones that help regulate mood, behaviors, and thoughts. It belongs to a group of medications called antipsychotics. Antipsychotic medications can be used to treat several kinds of mental health conditions. This medicine may be used for other purposes; ask your health care provider or pharmacist if you have questions. COMMON BRAND NAME(S): Seroquel What should I tell my care team before I take this medication? They need to know if you have any of these conditions: Blockage in your bowels Cataracts Constipation Dementia Diabetes Difficulty swallowing Glaucoma Heart disease High levels of prolactin History of breast cancer History of irregular heartbeat Liver disease Low blood cell levels (white cells, red cells, and platelets) Low blood pressure Parkinson disease Prostate disease Seizures Suicidal thoughts, plans, or attempt by you or a family member Thyroid disease Trouble passing urine An unusual or allergic reaction to quetiapine, other medications, foods, dyes, or preservatives Pregnant or trying to get pregnant Breastfeeding How should I use this medication? Take this medication by mouth with water. Take it as directed on the prescription label at the same time every day. You can take it with or without food. If it upsets your stomach, take it with food. Keep taking it unless your care team tells you to stop. A special MedGuide will be given to you by the pharmacist with each prescription and refill. Be sure to read this information carefully each time. Talk to your care team about the use of this medication in children. While this medication may be prescribed for children as young as 10 years for selected conditions, precautions do apply. People over 36 years of age may have a stronger  reaction to this medication and need smaller doses. Overdosage: If you think you have taken too much of this medicine contact a poison control center or emergency room at once. NOTE: This medicine is only for you. Do not share this medicine with others. What if I miss a dose? If you miss a dose, take it as soon as you can. If it is almost time for your next dose, take only that dose. Do not take double or extra doses. What may interact with this medication? Do not take this medication with any of the following: Cisapride Dronedarone Metoclopramide Pimozide Thioridazine This medication may also interact with the following: Alcohol Antihistamines for allergy, cough, and cold Atropine Avasimibe Certain antivirals for HIV or hepatitis Certain medications for anxiety or sleep Certain medications for bladder problems, such as oxybutynin, tolterodine Certain medications for depression, such as amitriptyline, fluoxetine, nefazodone, sertraline Certain medications for fungal infections, such as fluconazole, ketoconazole, itraconazole, posaconazole Certain medications for stomach problems, such as dicyclomine, hyoscyamine Certain medications for travel sickness, such as scopolamine Cimetidine General anesthetics, such as halothane, isoflurane, methoxyflurane, propofol Ipratropium Levodopa or other medications for Parkinson disease Medications for blood pressure Medications for seizures Medications that relax muscles for surgery Opioid medications for pain Other medications that cause heart rhythm changes Phenothiazines, such as chlorpromazine, prochlorperazine Rifampin St. John's wort This list may not describe all possible interactions. Give your health care provider a list of all the medicines, herbs, non-prescription drugs, or dietary supplements you use. Also tell them if you smoke, drink alcohol, or use illegal drugs. Some items may interact with your medicine. What should I watch for  while using this medication? Visit your care team for regular  checks on your progress. Tell your care team if your symptoms do not start to get better or if they get worse. Do not suddenly stop taking This medication. You may develop a severe reaction. Your care team will tell you how much medication to take. If your care team wants you to stop the medication, the dose may be slowly lowered over time to avoid any side effects. You may need to have an eye exam before and during use of this medication. This medication may increase blood sugar. Ask your care team if changes in diet or medications are needed if you have diabetes. This medication may cause thoughts of suicide or depression. This includes sudden changes in mood, behaviors, or thoughts. These changes can happen at any time but are more common in the beginning of treatment or after a change in dose. Call your care team right away if you experience these thoughts or worsening depression. This medication may affect your coordination, reaction time, or judgment. Do not drive or operate machinery until you know how this medication affects you. Sit up or stand slowly to reduce the risk of dizzy or fainting spells. Drinking alcohol with this medication can increase the risk of these side effects. This medication can cause problems with controlling your body temperature. It can lower the response of your body to cold temperatures. If possible, stay indoors during cold weather. If you must go outdoors, wear warm clothes. It can also lower the response of your body to heat. Do not overheat. Do not over-exercise. Stay out of the sun when possible. If you must be in the sun, wear cool clothing. Drink plenty of water. If you have trouble controlling your body temperature, call your care team right away. What side effects may I notice from receiving this medication? Side effects that you should report to your care team as soon as possible: Allergic  reactions--skin rash, itching, hives, swelling of the face, lips, tongue, or throat Heart rhythm changes--fast or irregular heartbeat, dizziness, feeling faint or lightheaded, chest pain, trouble breathing High blood sugar (hyperglycemia)--increased thirst or amount of urine, unusual weakness or fatigue, blurry vision High fever, stiff muscles, increased sweating, fast or irregular heartbeat, and confusion, which may be signs of neuroleptic malignant syndrome High prolactin level--unexpected breast tissue growth, discharge from the nipple, change in sex drive or performance, irregular menstrual cycle Increase in blood pressure in children Infection--fever, chills, cough, or sore throat Low blood pressure--dizziness, feeling faint or lightheaded, blurry vision Low thyroid levels (hypothyroidism)--unusual weakness or fatigue, increased sensitivity to cold, constipation, hair loss, dry skin, weight gain, feelings of depression Pain or trouble swallowing Seizures Stroke--sudden numbness or weakness of the face, arm, or leg, trouble speaking, confusion, trouble walking, loss of balance or coordination, dizziness, severe headache, change in vision Sudden eye pain or change in vision such as blurry vision, seeing halos around lights, vision loss Thoughts of suicide or self-harm, worsening mood, feelings of depression Trouble passing urine Uncontrolled and repetitive body movements, muscle stiffness or spasms, tremors or shaking, loss of balance or coordination, restlessness, shuffling walk, which may be signs of extrapyramidal symptoms (EPS) Side effects that usually do not require medical attention (report to your care team if they continue or are bothersome): Constipation Dizziness Drowsiness Dry mouth Weight gain This list may not describe all possible side effects. Call your doctor for medical advice about side effects. You may report side effects to FDA at 1-800-FDA-1088. Where should I keep my  medication? Keep out  of the reach of children. Store at room temperature between 15 and 30 degrees C (59 and 86 degrees F). Throw away any unused medication after the expiration date. NOTE: This sheet is a summary. It may not cover all possible information. If you have questions about this medicine, talk to your doctor, pharmacist, or health care provider.  2023 Elsevier/Gold Standard (2021-06-15 00:00:00)

## 2022-08-16 ENCOUNTER — Encounter: Payer: Self-pay | Admitting: Family Medicine

## 2022-08-16 ENCOUNTER — Telehealth (HOSPITAL_COMMUNITY): Payer: Self-pay | Admitting: Professional

## 2022-08-16 ENCOUNTER — Ambulatory Visit (INDEPENDENT_AMBULATORY_CARE_PROVIDER_SITE_OTHER): Payer: Managed Care, Other (non HMO) | Admitting: Family Medicine

## 2022-08-16 ENCOUNTER — Other Ambulatory Visit: Payer: Self-pay | Admitting: Psychiatry

## 2022-08-16 VITALS — BP 180/120 | HR 68 | Ht 70.0 in | Wt 215.0 lb

## 2022-08-16 DIAGNOSIS — R519 Headache, unspecified: Secondary | ICD-10-CM | POA: Diagnosis not present

## 2022-08-16 DIAGNOSIS — G8929 Other chronic pain: Secondary | ICD-10-CM

## 2022-08-16 DIAGNOSIS — R41840 Attention and concentration deficit: Secondary | ICD-10-CM | POA: Diagnosis not present

## 2022-08-16 DIAGNOSIS — F319 Bipolar disorder, unspecified: Secondary | ICD-10-CM

## 2022-08-16 DIAGNOSIS — I10 Essential (primary) hypertension: Secondary | ICD-10-CM

## 2022-08-16 MED ORDER — MIRTAZAPINE 15 MG PO TABS: 15.0000 mg | ORAL_TABLET | Freq: Every day | ORAL | 0 refills | Status: AC

## 2022-08-16 NOTE — Assessment & Plan Note (Signed)
Chronic, lacking adequate control, in setting of comorbid anxiety/PTSD. He has established cardiology and denies any current cardiopulmonary complaints. Has plans to schedule follow-up with them, I anticipate he may need further medication management versus improvements following tighter mood control. We will continue to follow peripherally.

## 2022-08-16 NOTE — Assessment & Plan Note (Signed)
Chronic, stable, follows with neurology and has upcoming visit with their group.  Will continue to follow peripherally on this issue.

## 2022-08-16 NOTE — Assessment & Plan Note (Signed)
Did have an opportunity to recently establish with psychiatry, patient found this very beneficial, has had medication management.  Will continue to follow peripherally on this issue.

## 2022-08-16 NOTE — Assessment & Plan Note (Signed)
Has upcoming neuropsychological testing.

## 2022-08-16 NOTE — Patient Instructions (Addendum)
-  Maintain follow-up with psychiatry, neurology, and set up visit with cardiology - Contact number below to schedule visit for further evaluation of hypertension this is high blood pressure) - Return for follow-up in 3 months for annual physical  Trinda Pascal, MD Nephrologist in [place], [place] Get online care: [email or web address] Address: [address] [place], [place] [address] Hours:  Open ? Closes 4:30?PM Phone: [phone]

## 2022-08-16 NOTE — Progress Notes (Signed)
     Primary Care / Sports Medicine Office Visit  Patient Information:  Patient ID: Mark Austin, male DOB: 31-Jan-1981 Age: 42 y.o. MRN: [id number]   Mark Austin is a pleasant 42 y.o. male presenting with the following:  Chief Complaint  Patient presents with   Hypertension   Anxiety    Vitals:   08/16/22 0935 08/16/22 0939  BP: (!) 160/110 (!) 180/120  Pulse: 68   SpO2: 98%    Vitals:   08/16/22 0935  Weight: 215 lb (97.5 kg)  Height: '5\' 10"'$  (1.778 m)   Body mass index is 30.85 kg/m.  No results found.   Independent interpretation of notes and tests performed by another provider:   None  Procedures performed:   None  Pertinent History, Exam, Impression, and Recommendations:   Mark Austin was seen today for hypertension and anxiety.  Essential (primary) hypertension Assessment & Plan: Chronic, lacking adequate control, in setting of comorbid anxiety/PTSD. He has established cardiology and denies any current cardiopulmonary complaints. Has plans to schedule follow-up with them, I anticipate he may need further medication management versus improvements following tighter mood control. We will continue to follow peripherally.    Bipolar and related disorder [facility]) Overview: unspecified  Assessment & Plan: Did have an opportunity to recently establish with psychiatry, patient found this very beneficial, has had medication management.  Will continue to follow peripherally on this issue.   Chronic nonintractable headache, unspecified headache type Assessment & Plan: Chronic, stable, follows with neurology and has upcoming visit with their group.  Will continue to follow peripherally on this issue.   Attention and concentration deficit Assessment & Plan: Has upcoming neuropsychological testing.   Other orders -     Mirtazapine; Take 1 tablet (15 mg total) by mouth at bedtime.  Dispense: 90 tablet; Refill: 0   I provided a total time of 62 minutes including both  face-to-face and non-face-to-face time on 08/16/2022 inclusive of time utilized for medical chart review, information gathering, care coordination with staff, and documentation completion.  Specifically involving EMR chart review for associated specialists' evaluations, management strategy, counseling, and appropriate follow-up discussion.   Orders & Medications Meds ordered this encounter  Medications   mirtazapine (REMERON) 15 MG tablet    Sig: Take 1 tablet (15 mg total) by mouth at bedtime.    Dispense:  90 tablet    Refill:  0   No orders of the defined types were placed in this encounter.    Return in about 3 months (around 11/15/2022) for CPE.     Mark Culver, MD, [facility]   Primary Care Sports Medicine Primary Care and Sports Medicine at [facility]

## 2022-08-20 ENCOUNTER — Other Ambulatory Visit (HOSPITAL_COMMUNITY): Payer: Managed Care, Other (non HMO) | Attending: Psychiatry | Admitting: Professional

## 2022-08-20 DIAGNOSIS — F319 Bipolar disorder, unspecified: Secondary | ICD-10-CM
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
## 2022-08-22 ENCOUNTER — Telehealth (HOSPITAL_COMMUNITY): Payer: Self-pay | Admitting: Professional

## 2022-09-16 ENCOUNTER — Ambulatory Visit: Payer: Managed Care, Other (non HMO) | Admitting: Psychiatry

## 2022-11-15 ENCOUNTER — Encounter: Payer: Self-pay | Admitting: Family Medicine

## 2023-06-02 ENCOUNTER — Emergency Department
Admission: EM | Admit: 2023-06-02 | Discharge: 2023-06-02 | Disposition: A | Discharge status: Home / Self Care | Payer: Managed Care, Other (non HMO) | Attending: Emergency Medicine | Admitting: Emergency Medicine

## 2023-06-02 ENCOUNTER — Emergency Department: Payer: Managed Care, Other (non HMO)

## 2023-06-02 ENCOUNTER — Other Ambulatory Visit: Payer: Self-pay

## 2023-06-02 ENCOUNTER — Encounter: Payer: Self-pay | Admitting: Emergency Medicine

## 2023-06-02 DIAGNOSIS — R079 Chest pain, unspecified: Secondary | ICD-10-CM | POA: Diagnosis present

## 2023-06-02 DIAGNOSIS — I10 Essential (primary) hypertension: Secondary | ICD-10-CM

## 2023-06-02 DIAGNOSIS — Z20822 Contact with and (suspected) exposure to covid-19: Secondary | ICD-10-CM | POA: Insufficient documentation

## 2023-06-02 LAB — BASIC METABOLIC PANEL
Anion gap: 10 (ref 5–15)
BUN: 8 mg/dL (ref 6–20)
CO2: 24 mmol/L (ref 22–32)
Calcium: 9.1 mg/dL (ref 8.9–10.3)
Chloride: 103 mmol/L (ref 98–111)
Creatinine, Ser: 0.86 mg/dL (ref 0.61–1.24)
GFR, Estimated: >60 mL/min (ref >60)
Glucose, Bld: 92 mg/dL (ref 70–99)
Potassium: 4.1 mmol/L (ref 3.5–5.1)
Sodium: 137 mmol/L (ref 135–145)

## 2023-06-02 LAB — RESP PANEL BY RT-PCR (RSV, FLU A&B, COVID)  RVPGX2
Influenza A by PCR: NEGATIVE
Influenza B by PCR: NEGATIVE
Resp Syncytial Virus by PCR: NEGATIVE
SARS Coronavirus 2 by RT PCR: NEGATIVE

## 2023-06-02 LAB — TROPONIN I (HIGH SENSITIVITY)
Troponin I (High Sensitivity): 8 ng/L (ref <18)
Troponin I (High Sensitivity): 10 ng/L (ref <18)

## 2023-06-02 LAB — CBC
HCT: 54.5 % — ABNORMAL HIGH (ref 39.0–52.0)
Hemoglobin: 18.2 g/dL — ABNORMAL HIGH (ref 13.0–17.0)
MCH: 30.6 pg (ref 26.0–34.0)
MCHC: 33.4 g/dL (ref 30.0–36.0)
MCV: 91.8 fL (ref 80.0–100.0)
Platelets: 220 10*3/uL (ref 150–400)
RBC: 5.94 MIL/uL — ABNORMAL HIGH (ref 4.22–5.81)
RDW: 13.3 % (ref 11.5–15.5)
WBC: 9.5 10*3/uL (ref 4.0–10.5)
nRBC: 0 % (ref 0.0–0.2)

## 2023-06-02 MED ORDER — LORAZEPAM 2 MG/ML IJ SOLN
1.0000 mg | Freq: Once | INTRAMUSCULAR | Status: AC
  Administered 2023-06-02: 1 mg via INTRAVENOUS
  Filled 2023-06-02: qty 1

## 2023-06-02 MED ORDER — SODIUM CHLORIDE 0.9 % IV BOLUS
500.0000 mL | Freq: Once | INTRAVENOUS | Status: AC
  Administered 2023-06-02: 500 mL via INTRAVENOUS

## 2023-06-02 MED ORDER — CLONIDINE HCL 0.1 MG PO TABS
0.1000 mg | ORAL_TABLET | Freq: Once | ORAL | Status: AC
  Administered 2023-06-02: 0.1 mg via ORAL
  Filled 2023-06-02: qty 1

## 2023-06-02 NOTE — ED Provider Notes (Signed)
[facility] Provider Note    Event Date/Time   First MD Initiated Contact with Patient 06/02/23 [phone]     (approximate)  History   Chief Complaint: Chest Pain and Shortness of Breath  HPI  Mark Austin is a 42 y.o. male with a past medical history of anxiety, chronic pain syndrome, chronic back pain, bipolar, hypertension, presents to the emergency department for chest pain.  According to the patient he started a new medication recently (amitriptyline) and has been on a significant amount of weight over the last 3 months.  He states he has been experiencing intermittent chest pain but it was worse this morning.  Patient also states his blood pressure has been high states he did take all of his medications this morning as prescribed.  States he is also had some intermittent pain going down his left arm.  Patient states he has had similar chest pains in the past but denies any known cardiac issues.  Patient is quite hypertensive 191/131 on arrival.  Patient states he took his blood pressure medication this morning.  Denies any arm symptoms currently.  Physical Exam   Triage Vital Signs: ED Triage Vitals  Encounter Vitals Group     BP 06/02/23 0849 (!) 191/131     Systolic BP Percentile --      Diastolic BP Percentile --      Pulse Rate 06/02/23 0849 (!) 105     Resp 06/02/23 0849 18     Temp 06/02/23 0849 97.7 F (36.5 C)     Temp Source 06/02/23 0849 Oral     SpO2 06/02/23 0849 96 %     Weight 06/02/23 0846 214 lb 15.2 oz (97.5 kg)     Height 06/02/23 0846 5\' 10"  (1.778 m)     Head Circumference --      Peak Flow --      Pain Score 06/02/23 0847 6     Pain Loc --      Pain Education --      Exclude from Growth Chart --     Most recent vital signs: Vitals:   06/02/23 0849  BP: (!) 191/131  Pulse: (!) 105  Resp: 18  Temp: 97.7 F (36.5 C)  SpO2: 96%    General: Awake, no distress.  Somewhat anxious appearing. CV:  Good peripheral perfusion.   Regular rate and rhythm  Resp:  Normal effort.  Equal breath sounds bilaterally.  Abd:  No distention.  Soft, nontender.  No rebound or guarding.  ED Results / Procedures / Treatments   EKG  EKG viewed and interpreted by myself shows sinus tachycardia at 103 bpm with a narrow QRS, normal axis, normal intervals, nonspecific ST changes.  RADIOLOGY  I reviewed and interpreted chest x-ray images.  No consolidation on my evaluation.   MEDICATIONS ORDERED IN ED: Medications  sodium chloride 0.9 % bolus 500 mL (has no administration in time range)  LORazepam (ATIVAN) injection 1 mg (has no administration in time range)     IMPRESSION / MDM / ASSESSMENT AND PLAN / ED COURSE  I reviewed the triage vital signs and the nursing notes.  Patient's presentation is most consistent with acute presentation with potential threat to life or bodily function.  Patient presents the emergency department for chest pain.  States the chest pain has been intermittent over the last 3 months or so but has been worse this morning so he came to the emergency department for evaluation.  Patient  is somewhat anxious in appearance, quite hypertensive with a history of anxiety, we will dose a small dose of Ativan.  We will check labs including cardiac enzymes a chest x-ray.  Patient is EKG shows mild tachycardia but no concerning findings.  Given the chest pain starting this morning we will check 2 sets of cardiac enzymes as a precaution.  Patient is agreeable to this plan of care.  Workup is reassuring.  Troponin is negative x 2, CBC shows no concerning findings besides mild hemoglobin elevation, patient received IV fluids, chemistry reassuring.  COVID/flu/RSV is negative, chest x-ray is clear.  Patient continues to sat in the upper 90s on room air.  Patient was quite hypertensive upon arrival 191/131.  Received 0.1 mg of clonidine and is down to 170/90.  I discussed with the patient the need to follow-up with his doctor  regarding his hypertension.  Patient agreeable to plan of care.  Otherwise I believe the patient safe for discharge home with outpatient follow-up.  Provided my typical return precautions.  FINAL CLINICAL IMPRESSION(S) / ED DIAGNOSES   Chest pain   Note:  This document was prepared using Dragon voice recognition software and may include unintentional dictation errors.   Minna Antis, MD 06/02/23 1423

## 2023-06-02 NOTE — ED Triage Notes (Signed)
Pt here with left sided cp and sob when he woke up this morning. Pt endorses pain radiating to his left arm. Pt states pain is dull in nature and has been consistent since is started. Pt also endorses nausea.

## 2023-06-02 NOTE — Discharge Instructions (Addendum)
Please continue to take your blood pressure medications as prescribed by your doctor.  Please follow-up with your doctor within the next 2 days for recheck/reevaluation.  Return to the emergency department for any return of/worsening trouble breathing, chest pain or any other symptom personally concerning to yourself.

## 2023-08-26 ENCOUNTER — Emergency Department
Admission: EM | Admit: 2023-08-26 | Discharge: 2023-08-26 | Disposition: A | Discharge status: Home / Self Care | Payer: 59 | Attending: Emergency Medicine | Admitting: Emergency Medicine

## 2023-08-26 ENCOUNTER — Emergency Department: Payer: 59

## 2023-08-26 ENCOUNTER — Other Ambulatory Visit: Payer: Self-pay

## 2023-08-26 DIAGNOSIS — Z20822 Contact with and (suspected) exposure to covid-19: Secondary | ICD-10-CM | POA: Insufficient documentation

## 2023-08-26 DIAGNOSIS — G44319 Acute post-traumatic headache, not intractable: Secondary | ICD-10-CM | POA: Diagnosis not present

## 2023-08-26 DIAGNOSIS — M549 Dorsalgia, unspecified: Secondary | ICD-10-CM | POA: Insufficient documentation

## 2023-08-26 DIAGNOSIS — W19XXXA Unspecified fall, initial encounter: Secondary | ICD-10-CM | POA: Diagnosis not present

## 2023-08-26 DIAGNOSIS — G8929 Other chronic pain: Secondary | ICD-10-CM | POA: Diagnosis not present

## 2023-08-26 DIAGNOSIS — I10 Essential (primary) hypertension: Secondary | ICD-10-CM | POA: Diagnosis not present

## 2023-08-26 LAB — RESP PANEL BY RT-PCR (RSV, FLU A&B, COVID)  RVPGX2
Influenza A by PCR: NEGATIVE
Influenza B by PCR: NEGATIVE
Resp Syncytial Virus by PCR: NEGATIVE
SARS Coronavirus 2 by RT PCR: NEGATIVE

## 2023-08-26 MED ORDER — DEXAMETHASONE SODIUM PHOSPHATE 10 MG/ML IJ SOLN
10.0000 mg | Freq: Once | INTRAMUSCULAR | Status: AC
  Administered 2023-08-26: 10 mg via INTRAMUSCULAR
  Filled 2023-08-26: qty 1

## 2023-08-26 MED ORDER — OXYCODONE HCL 5 MG PO TABS: 5.0000 mg | ORAL_TABLET | ORAL | 0 refills | Status: AC | PRN

## 2023-08-26 MED ORDER — OXYCODONE-ACETAMINOPHEN 5-325 MG PO TABS
1.0000 | ORAL_TABLET | Freq: Once | ORAL | Status: AC
  Administered 2023-08-26: 1 via ORAL
  Filled 2023-08-26: qty 1

## 2023-08-26 MED ORDER — CYCLOBENZAPRINE HCL 10 MG PO TABS
5.0000 mg | ORAL_TABLET | Freq: Once | ORAL | Status: AC
  Administered 2023-08-26: 5 mg via ORAL
  Filled 2023-08-26: qty 1

## 2023-08-26 NOTE — Discharge Instructions (Addendum)
Your evaluated in the ED for a fall and lower back pain.  Your head CT, cervical CT and lumbar spine imaging is normal.  Please follow-up with neurosurgery for further evaluation.  Call and schedule an appointment for tomorrow.  In the interim please get plenty of rest and stay hydrated.  Take pain medication prescribed today as needed.  Limit activity until you are able to follow-up with neurosurgery.

## 2023-08-26 NOTE — ED Triage Notes (Signed)
Pt sts that last week he fell and landed on his back. Pt sts that since than he has been having back pain with a headache. Pt sts that he has been unable to get out of bed due to the pain. Pt sts that he has lower lumbar pain with palpation.

## 2023-08-26 NOTE — ED Provider Triage Note (Signed)
Emergency Medicine Provider Triage Evaluation Note  Mark Austin , a 43 y.o. male  was evaluated in triage.  Pt complains of headache and back pain. Patient states that he fell a week ago and has had headache since then.  Taking ASA, IBU and tylenol without improvement.    Review of Systems  Positive: H/a, back pain Negative: - LOC, no vision changes.    Physical Exam  BP (!) 167/102 (BP Location: Left Arm)   Pulse (!) 107   Temp 98.7 F (37.1 C) (Oral)   Resp 18   Ht 5\' 10"  (1.778 m)   Wt 104.3 kg   SpO2 98%   BMI 33.00 kg/m  Gen:   Awake, no distress  talkative, answers questions appropriately.   Resp:  Normal effort  MSK:   Moves extremities without difficulty  Other:  Moderate tender lumbar spine to palpation.   No tenderness noted C-spine.   Medical Decision Making  Medically screening exam initiated at 1:35 PM.  Appropriate orders placed.  Mark Austin was informed that the remainder of the evaluation will be completed by another provider, this initial triage assessment does not replace that evaluation, and the importance of remaining in the ED until their evaluation is complete.     Tommi Rumps, PA-C 08/26/23 1340

## 2023-08-26 NOTE — ED Provider Notes (Signed)
[facility] Emergency Department Provider Note     None    (approximate)   History   Fall   HPI  Mark Austin is a 43 y.o. male with a history of chronic back pain migraines and HTN presents to the ED for evaluation of back pain and headache.  Patient reports he slipped and fell on ice last week and landed on his lower back.  He reports his pain initially resolved but has since returned with increase in severity.  He also reports a severe headache that is not similar to his usual migraines.  He describes his headache as pressure all over his head.  He endorses light sensitivity.  He denies vision changes, bowel and bladder control, saddle anesthesia and weakness of his lower extremities.  He is not on anticoagulation and no recent illnesses.    Physical Exam   Triage Vital Signs: ED Triage Vitals  Encounter Vitals Group     BP 08/26/23 1331 (!) 167/102     Systolic BP Percentile --      Diastolic BP Percentile --      Pulse Rate 08/26/23 1331 (!) 107     Resp 08/26/23 1331 18     Temp 08/26/23 1331 98.7 F (37.1 C)     Temp Source 08/26/23 1331 Oral     SpO2 08/26/23 1331 98 %     Weight 08/26/23 1331 230 lb (104.3 kg)     Height 08/26/23 1331 5\' 10"  (1.778 m)     Head Circumference --      Peak Flow --      Pain Score 08/26/23 1329 8     Pain Loc --      Pain Education --      Exclude from Growth Chart --     Most recent vital signs: Vitals:   08/26/23 1331 08/26/23 1702  BP: (!) 167/102 135/88  Pulse: (!) 107 96  Resp: 18   Temp: 98.7 F (37.1 C)   SpO2: 98% 96%    General: Alert and oriented. INAD.  Skin:  Warm, dry and intact. No rashes or lesions noted.     Head:  NCAT.  Eyes:  PERRLA. EOMI.  Neck:   No cervical spine tenderness to palpation. Full ROM without difficulty.  CV:  Good peripheral perfusion. RRR. No peripheral edema.  RESP:  Normal effort. LCTAB. No retractions.  ABD:  No distention. Soft, Non tender. No masses  or organomegaly. No CVA tenderness bilaterally.  BACK:  Spinous process is midline without deformity or tenderness. Generalized lower back tenderness to palpation along midline and paraspinal muscles. Limited FROM  with extension and flexion secondary to pain.  MSK:   Full ROM in all joints. No swelling, deformity or tenderness.  NEURO: Cranial nerves II-XII intact. No focal deficits. Sensation and motor function intact. 5/5 muscle strength of UE & LE. Gait is steady. 2+ DTR    ED Results / Procedures / Treatments   Labs (all labs ordered are listed, but only abnormal results are displayed) Labs Reviewed  RESP PANEL BY RT-PCR (RSV, FLU A&B, COVID)  [id number]   RADIOLOGY  I personally viewed and evaluated these images as part of my medical decision making, as well as reviewing the written report by the radiologist.  ED Provider Interpretation: clips noted in lumbar xray otherwise peers normal will confirm with final radiology read  DG Lumbar Spine 2-3 Views Result Date: 08/26/2023 CLINICAL DATA:  Fall onto back  lower lumbar pain EXAM: LUMBAR SPINE - 3 VIEW COMPARISON:  Lumbar spine radiograph dated 09/12/2021 FINDINGS: Surgical clips project over the right upper quadrant and left lower quadrant. Postsurgical changes at L4-5 spinous process. There is no evidence of lumbar spine fracture. Alignment is normal. Intervertebral disc spaces are maintained. IMPRESSION: No acute fracture or traumatic listhesis. Electronically Signed   By: Agustin Cree M.D.   On: 08/26/2023 14:16   CT Head Wo Contrast Result Date: 08/26/2023 CLINICAL DATA:  Larey Seat last week, back pain with headache EXAM: CT HEAD WITHOUT CONTRAST CT CERVICAL SPINE WITHOUT CONTRAST TECHNIQUE: Multidetector CT imaging of the head and cervical spine was performed following the standard protocol without intravenous contrast. Multiplanar CT image reconstructions of the cervical spine were also generated. RADIATION DOSE REDUCTION: This exam was  performed according to the departmental dose-optimization program which includes automated exposure control, adjustment of the mA and/or kV according to patient size and/or use of iterative reconstruction technique. COMPARISON:  06/05/2022 CT head, no prior CT cervical spine FINDINGS: CT HEAD FINDINGS Brain: No evidence of acute infarct, hemorrhage, mass, mass effect, or midline shift. No hydrocephalus or extra-axial fluid collection. Partial empty sella. Normal craniocervical junction. Vascular: No hyperdense vessel. Skull: Negative for fracture or focal lesion. Sinuses/Orbits: No acute finding. Other: Trace fluid in the mastoid air cells. CT CERVICAL SPINE FINDINGS Alignment: No traumatic listhesis. Straightening of the normal cervical lordosis. Skull base and vertebrae: No acute fracture or suspicious osseous lesion. Soft tissues and spinal canal: No prevertebral fluid or swelling. No visible canal hematoma. Disc levels: Degenerative changes in the cervical spine.No high-grade spinal canal stenosis. Upper chest: No focal pulmonary opacity or pleural effusion. IMPRESSION: 1. No acute intracranial process. 2. No acute fracture or traumatic listhesis in the cervical spine. Electronically Signed   By: Wiliam Ke M.D.   On: 08/26/2023 14:06   CT Cervical Spine Wo Contrast Result Date: 08/26/2023 CLINICAL DATA:  Larey Seat last week, back pain with headache EXAM: CT HEAD WITHOUT CONTRAST CT CERVICAL SPINE WITHOUT CONTRAST TECHNIQUE: Multidetector CT imaging of the head and cervical spine was performed following the standard protocol without intravenous contrast. Multiplanar CT image reconstructions of the cervical spine were also generated. RADIATION DOSE REDUCTION: This exam was performed according to the departmental dose-optimization program which includes automated exposure control, adjustment of the mA and/or kV according to patient size and/or use of iterative reconstruction technique. COMPARISON:  06/05/2022 CT  head, no prior CT cervical spine FINDINGS: CT HEAD FINDINGS Brain: No evidence of acute infarct, hemorrhage, mass, mass effect, or midline shift. No hydrocephalus or extra-axial fluid collection. Partial empty sella. Normal craniocervical junction. Vascular: No hyperdense vessel. Skull: Negative for fracture or focal lesion. Sinuses/Orbits: No acute finding. Other: Trace fluid in the mastoid air cells. CT CERVICAL SPINE FINDINGS Alignment: No traumatic listhesis. Straightening of the normal cervical lordosis. Skull base and vertebrae: No acute fracture or suspicious osseous lesion. Soft tissues and spinal canal: No prevertebral fluid or swelling. No visible canal hematoma. Disc levels: Degenerative changes in the cervical spine.No high-grade spinal canal stenosis. Upper chest: No focal pulmonary opacity or pleural effusion. IMPRESSION: 1. No acute intracranial process. 2. No acute fracture or traumatic listhesis in the cervical spine. Electronically Signed   By: Wiliam Ke M.D.   On: 08/26/2023 14:06    PROCEDURES:  Critical Care performed: No  Procedures   MEDICATIONS ORDERED IN ED: Medications  oxyCODONE-acetaminophen (PERCOCET/ROXICET) 5-325 MG per tablet 1 tablet (1 tablet Oral Given 08/26/23 1816)  cyclobenzaprine (FLEXERIL) tablet 5 mg (5 mg Oral Given 08/26/23 1816)  dexamethasone (DECADRON) injection 10 mg (10 mg Intramuscular Given 08/26/23 1815)     IMPRESSION / MDM / ASSESSMENT AND PLAN / ED COURSE  I reviewed the triage vital signs and the nursing notes.                               43 y.o. male presents to the emergency department for evaluation and treatment of fall x 1 week ago with headache and acute on chronic low back pain. See HPI for further details.   Differential diagnosis includes, but is not limited to cauda equina, migraine, tension headache  Patient's presentation is most consistent with acute complicated illness / injury requiring diagnostic workup.  Patient  reports acute on chronic back pain.  Imagings reveal no acute bony abnormality or acute processes.  Given patient's chronic pain he is encouraged to close follow-up with neurosurgery for further evaluation.  The interim I will prescribe pain medication.  I did consider admission, however physical exam findings are normal, there is no neural deficit or abnormality of the neuroexam.  Patient's vitals are within normal limits and imagings findings do not indicate in emergent intervention.  I do believe patient is in stable condition for outpatient follow-up.  Patient is in agreements with this plan.  ED return precautions are discussed.  All questions and concerns were addressed during this ED visit.  FINAL CLINICAL IMPRESSION(S) / ED DIAGNOSES   Final diagnoses:  Fall, initial encounter  Acute on chronic back pain  Acute post-traumatic headache, not intractable    Rx / DC Orders   ED Discharge Orders          Ordered    oxyCODONE (ROXICODONE) 5 MG immediate release tablet  Every 4 hours PRN        08/26/23 1805           Note:  This document was prepared using Dragon voice recognition software and may include unintentional dictation errors.    Romeo Apple, Takiyah Bohnsack A, PA-C 08/26/23 1851    Janith Lima, MD 08/26/23 [id number]

## 2023-09-04 ENCOUNTER — Emergency Department
Admission: EM | Admit: 2023-09-04 | Discharge: 2023-09-04 | Disposition: A | Discharge status: Home / Self Care | Payer: 59 | Attending: Emergency Medicine | Admitting: Emergency Medicine

## 2023-09-04 ENCOUNTER — Emergency Department: Payer: 59

## 2023-09-04 ENCOUNTER — Other Ambulatory Visit: Payer: Self-pay

## 2023-09-04 DIAGNOSIS — R109 Unspecified abdominal pain: Secondary | ICD-10-CM | POA: Diagnosis present

## 2023-09-04 DIAGNOSIS — I10 Essential (primary) hypertension: Secondary | ICD-10-CM | POA: Insufficient documentation

## 2023-09-04 DIAGNOSIS — M545 Low back pain, unspecified: Secondary | ICD-10-CM | POA: Diagnosis not present

## 2023-09-04 LAB — COMPREHENSIVE METABOLIC PANEL
ALT: 35 U/L (ref 0–44)
AST: 22 U/L (ref 15–41)
Albumin: 4.1 g/dL (ref 3.5–5.0)
Alkaline Phosphatase: 64 U/L (ref 38–126)
Anion gap: 11 (ref 5–15)
BUN: 13 mg/dL (ref 6–20)
CO2: 23 mmol/L (ref 22–32)
Calcium: 9.4 mg/dL (ref 8.9–10.3)
Chloride: 101 mmol/L (ref 98–111)
Creatinine, Ser: 0.76 mg/dL (ref 0.61–1.24)
GFR, Estimated: >60 mL/min (ref >60)
Glucose, Bld: 94 mg/dL (ref 70–99)
Potassium: 4.2 mmol/L (ref 3.5–5.1)
Sodium: 135 mmol/L (ref 135–145)
Total Bilirubin: 1 mg/dL (ref 0.0–1.2)
Total Protein: 7.1 g/dL (ref 6.5–8.1)

## 2023-09-04 LAB — URINALYSIS, ROUTINE W REFLEX MICROSCOPIC
Bilirubin Urine: NEGATIVE
Glucose, UA: NEGATIVE mg/dL
Hgb urine dipstick: NEGATIVE
Ketones, ur: NEGATIVE mg/dL
Leukocytes,Ua: NEGATIVE
Nitrite: NEGATIVE
Protein, ur: NEGATIVE mg/dL
Specific Gravity, Urine: 1.016 (ref 1.005–1.030)
pH: 5 (ref 5.0–8.0)

## 2023-09-04 LAB — CBC WITH DIFFERENTIAL/PLATELET
Abs Immature Granulocytes: 0.08 10*3/uL — ABNORMAL HIGH (ref 0.00–0.07)
Basophils Absolute: 0 10*3/uL (ref 0.0–0.1)
Basophils Relative: 0 %
Eosinophils Absolute: 0.1 10*3/uL (ref 0.0–0.5)
Eosinophils Relative: 1 %
HCT: 44 % (ref 39.0–52.0)
Hemoglobin: 15.9 g/dL (ref 13.0–17.0)
Immature Granulocytes: 1 %
Lymphocytes Relative: 26 %
Lymphs Abs: 2.7 10*3/uL (ref 0.7–4.0)
MCH: 31.5 pg (ref 26.0–34.0)
MCHC: 36.1 g/dL — ABNORMAL HIGH (ref 30.0–36.0)
MCV: 87.1 fL (ref 80.0–100.0)
Monocytes Absolute: 0.9 10*3/uL (ref 0.1–1.0)
Monocytes Relative: 9 %
Neutro Abs: 6.5 10*3/uL (ref 1.7–7.7)
Neutrophils Relative %: 63 %
Platelets: 223 10*3/uL (ref 150–400)
RBC: 5.05 MIL/uL (ref 4.22–5.81)
RDW: 13.4 % (ref 11.5–15.5)
WBC: 10.3 10*3/uL (ref 4.0–10.5)
nRBC: 0 % (ref 0.0–0.2)

## 2023-09-04 MED ORDER — ONDANSETRON 4 MG PO TBDP
4.0000 mg | ORAL_TABLET | Freq: Once | ORAL | Status: AC
  Administered 2023-09-04: 4 mg via ORAL
  Filled 2023-09-04: qty 1

## 2023-09-04 MED ORDER — FENTANYL CITRATE PF 50 MCG/ML IJ SOSY
50.0000 ug | PREFILLED_SYRINGE | Freq: Once | INTRAMUSCULAR | Status: AC
Start: 2023-09-04 — End: 2023-09-04
  Administered 2023-09-04: 50 ug via INTRAMUSCULAR
  Filled 2023-09-04: qty 1

## 2023-09-04 MED ORDER — DEXAMETHASONE SODIUM PHOSPHATE 10 MG/ML IJ SOLN
10.0000 mg | Freq: Once | INTRAMUSCULAR | Status: AC
  Administered 2023-09-04: 10 mg via INTRAMUSCULAR
  Filled 2023-09-04: qty 1

## 2023-09-04 MED ORDER — TAMSULOSIN HCL 0.4 MG PO CAPS: 0.4000 mg | ORAL_CAPSULE | Freq: Every day | ORAL | 0 refills | Status: AC

## 2023-09-04 MED ORDER — PREDNISONE 20 MG PO TABS: 40.0000 mg | ORAL_TABLET | Freq: Every day | ORAL | 0 refills | Status: AC

## 2023-09-04 MED ORDER — CYCLOBENZAPRINE HCL 10 MG PO TABS
10.0000 mg | ORAL_TABLET | Freq: Once | ORAL | Status: AC
  Administered 2023-09-04: 10 mg via ORAL
  Filled 2023-09-04: qty 1

## 2023-09-04 MED ORDER — OXYCODONE-ACETAMINOPHEN 5-325 MG PO TABS: 1.0000 | ORAL_TABLET | ORAL | 0 refills | Status: DC | PRN

## 2023-09-04 MED ORDER — CYCLOBENZAPRINE HCL 5 MG PO TABS: 5.0000 mg | ORAL_TABLET | Freq: Three times a day (TID) | ORAL | 0 refills | Status: AC | PRN

## 2023-09-04 MED ORDER — HYDROCODONE-ACETAMINOPHEN 5-325 MG PO TABS
1.0000 | ORAL_TABLET | Freq: Once | ORAL | Status: AC
  Administered 2023-09-04: 1 via ORAL
  Filled 2023-09-04: qty 1

## 2023-09-04 NOTE — Discharge Instructions (Signed)
 Your exam, labs, and CT scan are normal reassuring at this time.  No evidence of kidney stones, kidney infection, or kidney blockage.  Symptoms likely represent a musculoskeletal etiology.  No evidence of a lumbar radiculopathy or sciatica at this time.  Take the prescription meds as directed.  Follow-up with your spine specialist or primary provider as discussed.  Return to ED if needed.

## 2023-09-04 NOTE — ED Triage Notes (Signed)
 Pt to ED via POV from home. Pt reports worsening lower back and pain with urination. Pt reports had a fall on ice a few weeks ago and started having back pain. Pt was seen with negative work up. Pt reports hx of kidney stones.

## 2023-09-04 NOTE — ED Provider Triage Note (Signed)
 Emergency Medicine Provider Triage Evaluation Note  Mark Austin , a 43 y.o. male  was evaluated in triage.  Pt complains of severe lower back pain that is bilateral. Says it is worse when he pees. History of kidney stones. Pain began about 2 weeks ago after a he slipped and fell on the ice. Had negative xrays at that time. Was given oxycodone  at that time. States that his pain has changed since his last visit. His pain is intermittent.  Review of Systems  Positive: Back pain, diaphoresis, pain with urination Negative: incontinence  Physical Exam  There were no vitals taken for this visit. Gen:   Awake, no distress   Resp:  Normal effort  MSK:   Moves extremities without difficulty  Other:    Medical Decision Making  Medically screening exam initiated at 4:38 PM.  Appropriate orders placed.  Mark Austin was informed that the remainder of the evaluation will be completed by another provider, this initial triage assessment does not replace that evaluation, and the importance of remaining in the ED until their evaluation is complete.     Cleaster Tinnie LABOR, PA-C 09/04/23 1643

## 2023-09-09 NOTE — Progress Notes (Deleted)
 Referring Physician:  Jerrol Banana, MD [address] [place],  [place] [address]  Primary Physician:  Jerrol Banana, MD  History of Present Illness: 09/09/2023 Mark Austin is here today with a chief complaint of ***  Low back pain  Duration: *** Location: *** Quality: *** Severity: ***  Precipitating: aggravated by *** Modifying factors: made better by *** Weakness: none Timing: *** Bowel/Bladder Dysfunction: none  Conservative measures:  Physical therapy: *** has not participated in? Multimodal medical therapy including regular antiinflammatories: *** flexeril, oxycodone, prednisone  Injections: ***  10/02/2021: Left L4-5 transforaminal ESI   Past Surgery: *** Lumbar Surgery  [name] has ***no symptoms of cervical myelopathy.  The symptoms are causing a significant impact on the patient's life.   Review of Systems:  A 10 point review of systems is negative, except for the pertinent positives and negatives detailed in the HPI.  Past Medical History: Past Medical History:  Diagnosis Date   Anxiety    Back ache    Depression    Diverticulitis    GERD (gastroesophageal reflux disease)    History of kidney stones    Hypertension    Inguinal hernia    PTSD (post-traumatic stress disorder)    Seizure (HCC)    over 20 yrs ago.  related to medications    Past Surgical History: Past Surgical History:  Procedure Laterality Date   back surgeries     x5 lumbar L4L5   BACK SURGERY     BOWEL RESECTION  2016   CHOLECYSTECTOMY  2017   COLON SURGERY     COLONOSCOPY     COLONOSCOPY WITH PROPOFOL N/A 03/19/2021   Procedure: COLONOSCOPY WITH BIOPSY;  Surgeon: Midge Minium, MD;  Location: [facility];  Service: Endoscopy;  Laterality: N/A;   EXCISION MASS LOWER EXTREMETIES Left 11/30/2019   Procedure: EXCISION MASS LOWER EXTREMETIES, Left Hip;  Surgeon: Leafy Ro, MD;  Location: [facility];  Service: General;  Laterality: Left;    HERNIA REPAIR     INGUINAL HERNIA REPAIR Left    POLYPECTOMY N/A 03/19/2021   Procedure: POLYPECTOMY;  Surgeon: Midge Minium, MD;  Location: [facility];  Service: Endoscopy;  Laterality: N/A;   SHOULDER SURGERY Right    rotator cuff tear   XI ROBOTIC ASSISTED INGUINAL HERNIA REPAIR WITH MESH Right 10/08/2019   Procedure: XI ROBOTIC ASSISTED INGUINAL HERNIA REPAIR WITH MESH;  Surgeon: Leafy Ro, MD;  Location: [facility];  Service: General;  Laterality: Right;    Allergies: Allergies as of 09/10/2023 - Review Complete 09/04/2023  Allergen Reaction Noted   Ketorolac tromethamine Anaphylaxis 01/26/2009   Lidocaine Other (See Comments) and Anaphylaxis 08/06/2016   Tramadol Other (See Comments) 09/04/2015   Atorvastatin Other (See Comments) 06/12/2020   Brassica oleracea Hives 02/03/2016   Carisoprodol Other (See Comments) 05/03/2009   Gabapentin Other (See Comments) and Palpitations 02/05/2016   Rosuvastatin Other (See Comments) 02/06/2021    Medications: Outpatient Encounter Medications as of 09/10/2023  Medication Sig   amLODipine (NORVASC) 10 MG tablet Take 1 tablet (10 mg total) by mouth daily.   anastrozole (ARIMIDEX) 1 MG tablet Take 1 mg by mouth once a week.   ARIPiprazole (ABILIFY) 10 MG tablet Take 0.5 tablets (5 mg total) by mouth daily. Stop after 5 days   benztropine (COGENTIN) 1 MG tablet Take 1 tablet (1 mg total) by mouth 2 (two) times daily.   carvedilol (COREG) 25 MG tablet Take 25 mg by  mouth 2 (two) times daily.   cyclobenzaprine (FLEXERIL) 5 MG tablet Take 1-2 tablets (5-10 mg total) by mouth 3 (three) times daily as needed.   ezetimibe (ZETIA) 10 MG tablet Take 10 mg by mouth daily.   liothyronine (CYTOMEL) 5 MCG tablet Take 5 mcg by mouth 2 (two) times daily.   lisinopril (ZESTRIL) 20 MG tablet Take 1 tablet (20 mg total) by mouth daily.   methylphenidate (RITALIN) 5 MG tablet Take 1 tablet (5 mg total) by mouth 2 (two) times daily with breakfast and  lunch. (Patient taking differently: Take 5 mg by mouth in the morning.)   mirtazapine (REMERON) 15 MG tablet Take 1 tablet (15 mg total) by mouth at bedtime.   ondansetron (ZOFRAN-ODT) 4 MG disintegrating tablet Take 1 tablet (4 mg total) by mouth every 8 (eight) hours as needed.   oxyCODONE-acetaminophen (PERCOCET) 5-325 MG tablet Take 1 tablet by mouth every 4 (four) hours as needed for severe pain (pain score 7-10).   pantoprazole (PROTONIX) 40 MG tablet Take 1 tablet (40 mg total) by mouth daily.   prazosin (MINIPRESS) 1 MG capsule TAKE 1 CAPSULE(1 MG) BY MOUTH TWICE DAILY   predniSONE (DELTASONE) 20 MG tablet Take 2 tablets (40 mg total) by mouth daily with breakfast for 5 days.   QUEtiapine (SEROQUEL) 25 MG tablet Take 1-2 tablets (25-50 mg total) by mouth as directed. Take 1 tablet at bedtime for 15 days and increase to 2 tablets (50 mg ) after that.   SUMAtriptan (IMITREX) 100 MG tablet Take 1 tablet (100 mg total) by mouth once as needed for migraine. May repeat in 2 hours if headache persists or recurs.   tadalafil (CIALIS) 20 MG tablet Take 20 mg by mouth daily as needed for erectile dysfunction.   tamsulosin (FLOMAX) 0.4 MG CAPS capsule Take 1 capsule (0.4 mg total) by mouth daily after supper for 10 days.   testosterone cypionate (DEPOTESTOTERONE CYPIONATE) 100 MG/ML injection Inject 200 mg into the muscle every 7 (seven) days. For IM use only  Thursday's   varenicline (CHANTIX) 1 MG tablet Take 1 tablet (1 mg total) by mouth 2 (two) times daily.   No facility-administered encounter medications on file as of 09/10/2023.    Social History: Social History   Tobacco Use   Smoking status: Every Day    Current packs/day: 1.00    Types: Cigarettes   Smokeless tobacco: Former  Building services engineer status: Former  Substance Use Topics   Alcohol use: Not Currently   Drug use: Yes    Frequency: 2.0 times per week    Types: Other-see comments    Comment: delta-8-Tetrahydrocannabinol     Family Medical History: Family History  Problem Relation Age of Onset   Anxiety disorder Mother    Healthy Father     Physical Examination: @VITALWITHPAIN @  General: Patient is well developed, well nourished, calm, collected, and in no apparent distress. Attention to examination is appropriate.  Psychiatric: Patient is non-anxious.  Head:  Pupils equal, round, and reactive to light.  ENT:  Oral mucosa appears well hydrated.  Neck:   Supple.  ***Full range of motion.  Respiratory: Patient is breathing without any difficulty.  Extremities: No edema.  Vascular: Palpable dorsal pedal pulses.  Skin:   On exposed skin, there are no abnormal skin lesions.  NEUROLOGICAL:     Awake, alert, oriented to person, place, and time.  Speech is clear and fluent. Fund of knowledge is appropriate.   Cranial  Nerves: Pupils equal round and reactive to light.  Facial tone is symmetric.  Facial sensation is symmetric.  ROM of spine: ***full.  Palpation of spine: ***non tender.    Strength: Side Biceps Triceps Deltoid Interossei Grip Wrist Ext. Wrist Flex.  R 5 5 5 5 5 5 5   L 5 5 5 5 5 5 5    Side Iliopsoas Quads Hamstring PF DF EHL  R 5 5 5 5 5 5   L 5 5 5 5 5 5    Reflexes are ***2+ and symmetric at the biceps, triceps, brachioradialis, patella and achilles.   Hoffman's is absent.  Clonus is not present.  Toes are down-going.  Bilateral upper and lower extremity sensation is intact to light touch.    Gait is normal.   No difficulty with tandem gait.   No evidence of dysmetria noted.  Medical Decision Making  Imaging: ***  I have personally reviewed the images and agree with the above interpretation.  Assessment and Plan: Mr. Schueler is a pleasant 43 y.o. male with ***    Thank you for involving me in the care of this patient.   I spent a total of *** minutes in both face-to-face and non-face-to-face activities for this visit on the date of this encounter.   Joan Flores, PA-C Dept. of Neurosurgery

## 2023-09-10 ENCOUNTER — Ambulatory Visit: Payer: 59 | Admitting: Physician Assistant

## 2023-09-15 NOTE — ED Provider Notes (Signed)
 [facility] Emergency Department Provider Note     Event Date/Time   First MD Initiated Contact with Patient 09/04/23 1903     (approximate)   History   Back Pain and Dysuria   HPI  Mark Austin is a 43 y.o. male with a history of kidney stones, chronic pain syndrome, chronic low back pain, HTN, and GERD, presents to the ED for ongoing flank pain and intermittent pain with urination.  He denies any gross hematuria or urinary retention.  He does give a remote history of kidney stones.  He would also endorse a fall 2 to 3 weeks prior on ice, for which he had a normal and reassuring workup in the ED a week ago.  Patient denies any interim injury.  Physical Exam   Triage Vital Signs: ED Triage Vitals [09/04/23 1641]  Encounter Vitals Group     BP (!) 169/114     Systolic BP Percentile      Diastolic BP Percentile      Pulse Rate (!) 101     Resp 20     Temp 99.7 F (37.6 C)     Temp Source Oral     SpO2 95 %     Weight      Height      Head Circumference      Peak Flow      Pain Score 7     Pain Loc      Pain Education      Exclude from Growth Chart     Most recent vital signs: Vitals:   09/04/23 2020 09/04/23 2021  BP: (!) 159/108   Pulse: 80 79  Resp:    Temp:    SpO2:  98%    General Awake, no distress. NAD HEENT NCAT. PERRL. EOMI. No rhinorrhea. Mucous membranes are moist.  CV:  Good peripheral perfusion. RRR RESP:  Normal effort. CTA ABD:  No distention. Mild flank tenderness MSK:  Normal spinal alignment without midline tenderness, spasm, vomiting, or step-off.  Active range of motion of all extremities on exam.   ED Results / Procedures / Treatments   Labs (all labs ordered are listed, but only abnormal results are displayed) Labs Reviewed  CBC WITH DIFFERENTIAL/PLATELET - Abnormal; Notable for the following components:      Result Value   MCHC 36.1 (*)    Abs Immature Granulocytes 0.08 (*)    All other components  within normal limits  URINALYSIS, ROUTINE W REFLEX MICROSCOPIC - Abnormal; Notable for the following components:   Color, Urine YELLOW (*)    APPearance CLEAR (*)    All other components within normal limits  COMPREHENSIVE METABOLIC PANEL     EKG   RADIOLOGY  I personally viewed and evaluated these images as part of my medical decision making, as well as reviewing the written report by the radiologist.  ED Provider Interpretation: No acute findings   CT Renal Stone Study  IMPRESSION: 1. No acute abnormality. 2. No urinary tract calculi or hydronephrosis. 3. Mild descending and proximal sigmoid colon diverticulosis without evidence of diverticulitis. 4. Small to moderate-sized right inguinal hernia containing fat.   PROCEDURES:  Critical Care performed: No  Procedures   MEDICATIONS ORDERED IN ED: Medications  cyclobenzaprine  (FLEXERIL ) tablet 10 mg (10 mg Oral Given 09/04/23 1940)  HYDROcodone -acetaminophen  (NORCO/VICODIN) 5-325 MG per tablet 1 tablet (1 tablet Oral Given 09/04/23 1940)  dexamethasone  (DECADRON ) injection 10 mg (10 mg Intramuscular Given 09/04/23 2010)  ondansetron  (ZOFRAN -ODT) disintegrating tablet 4 mg (4 mg Oral Given 09/04/23 2014)  fentaNYL  (SUBLIMAZE ) injection 50 mcg (50 mcg Intramuscular Given 09/04/23 2014)     IMPRESSION / MDM / ASSESSMENT AND PLAN / ED COURSE  I reviewed the triage vital signs and the nursing notes.                              Differential diagnosis includes, but is not limited to, renal stones, lumbar strain, lumbar radiculopathy, lumbar compression fracture, myalgias  Patient's presentation is most consistent with acute complicated illness / injury requiring diagnostic workup.  Patient's diagnosis is consistent with acute left-sided low back pain without sciatica.  Patient with reassuring exam and workup at this time.  No UA evidence of hematuria or leukocyturia.  CT renal stone is also negative for any findings to correlate  to the patient's symptoms.  Physical exam without evidence of acute cord compression or radiculopathy.  Patient noting some improvement of her symptoms after ED medication administration.  Patient will be discharged home with prescriptions for cyclobenzaprine , Percocet, prednisone , and tamsulosin . Patient is to follow up with his PCP and urology as discussed, as needed or otherwise directed. Patient is given ED precautions to return to the ED for any worsening or new symptoms.   FINAL CLINICAL IMPRESSION(S) / ED DIAGNOSES   Final diagnoses:  Acute left-sided low back pain without sciatica     Rx / DC Orders   ED Discharge Orders          Ordered    oxyCODONE -acetaminophen  (PERCOCET) 5-325 MG tablet  Every 4 hours PRN        09/04/23 2002    cyclobenzaprine  (FLEXERIL ) 5 MG tablet  3 times daily PRN        09/04/23 2005    predniSONE  (DELTASONE ) 20 MG tablet  Daily with breakfast        09/04/23 2005    tamsulosin  (FLOMAX ) 0.4 MG CAPS capsule  Daily after supper        09/04/23 2006             Note:  This document was prepared using Dragon voice recognition software and may include unintentional dictation errors.    Loyd Candida LULLA Aldona, PA-C 09/15/23 2209    Dicky Anes, MD 09/16/23 1229

## 2023-09-18 NOTE — Progress Notes (Deleted)
 Referring Physician:  Jerrol Banana, MD [address] [place],  [place] [address]  Primary Physician:  Jerrol Banana, MD  History of Present Illness: 09/18/2023*** [name] has a history of kidney stones, chronic pain, HTN, and GERD.   History of lumbar back surgery x 5.   Seen in ED on 09/04/23 with flank pain and pain with voiding. Was seen in ED 08/26/23 after falling on ice a week prior and having increased LBP.   He was given flexeril, oxycodone, and prednisone from ED on 09/04/23.     Duration: *** Location: *** Quality: *** Severity: ***  Precipitating: aggravated by *** Modifying factors: made better by *** Weakness: none Timing: *** Bowel/Bladder Dysfunction: none  Conservative measures:  Physical therapy: ***  Multimodal medical therapy including regular antiinflammatories: flexeril, oxycodone, prednisone  Injections:  10/02/2021: Left L4-5 transforaminal ESI   Past Surgery:  Lumbar back surgery x 5  Mark Austin has ***no symptoms of cervical myelopathy.  The symptoms are causing a significant impact on the patient's life.   Review of Systems:  A 10 point review of systems is negative, except for the pertinent positives and negatives detailed in the HPI.  Past Medical History: Past Medical History:  Diagnosis Date   Anxiety    Back ache    Depression    Diverticulitis    GERD (gastroesophageal reflux disease)    History of kidney stones    Hypertension    Inguinal hernia    PTSD (post-traumatic stress disorder)    Seizure (HCC)    over 20 yrs ago.  related to medications    Past Surgical History: Past Surgical History:  Procedure Laterality Date   back surgeries     x5 lumbar L4L5   BACK SURGERY     BOWEL RESECTION  2016   CHOLECYSTECTOMY  2017   COLON SURGERY     COLONOSCOPY     COLONOSCOPY WITH PROPOFOL N/A 03/19/2021   Procedure: COLONOSCOPY WITH BIOPSY;  Surgeon: Midge Minium, MD;  Location: [facility];  Service: Endoscopy;  Laterality: N/A;   EXCISION MASS LOWER EXTREMETIES Left 11/30/2019   Procedure: EXCISION MASS LOWER EXTREMETIES, Left Hip;  Surgeon: Leafy Ro, MD;  Location: [facility] ORS;  Service: General;  Laterality: Left;   HERNIA REPAIR     INGUINAL HERNIA REPAIR Left    POLYPECTOMY N/A 03/19/2021   Procedure: POLYPECTOMY;  Surgeon: Midge Minium, MD;  Location: [facility] SURGERY [facility];  Service: Endoscopy;  Laterality: N/A;   SHOULDER SURGERY Right    rotator cuff tear   XI ROBOTIC ASSISTED INGUINAL HERNIA REPAIR WITH MESH Right 10/08/2019   Procedure: XI ROBOTIC ASSISTED INGUINAL HERNIA REPAIR WITH MESH;  Surgeon: Leafy Ro, MD;  Location: [facility] ORS;  Service: General;  Laterality: Right;    Allergies: Allergies as of 09/22/2023 - Review Complete 09/04/2023  Allergen Reaction Noted   Ketorolac tromethamine Anaphylaxis 01/26/2009   Lidocaine Other (See Comments) and Anaphylaxis 08/06/2016   Tramadol Other (See Comments) 09/04/2015   Atorvastatin Other (See Comments) 06/12/2020   Brassica oleracea Hives 02/03/2016   Carisoprodol Other (See Comments) 05/03/2009   Gabapentin Other (See Comments) and Palpitations 02/05/2016   Rosuvastatin Other (See Comments) 02/06/2021    Medications: Outpatient Encounter Medications as of 09/22/2023  Medication Sig   amLODipine (NORVASC) 10 MG tablet Take 1 tablet (10 mg total) by mouth daily.   anastrozole (ARIMIDEX) 1 MG tablet Take 1 mg by mouth once a  week.   ARIPiprazole (ABILIFY) 10 MG tablet Take 0.5 tablets (5 mg total) by mouth daily. Stop after 5 days   benztropine (COGENTIN) 1 MG tablet Take 1 tablet (1 mg total) by mouth 2 (two) times daily.   carvedilol (COREG) 25 MG tablet Take 25 mg by mouth 2 (two) times daily.   cyclobenzaprine (FLEXERIL) 5 MG tablet Take 1-2 tablets (5-10 mg total) by mouth 3 (three) times daily as needed.   ezetimibe (ZETIA) 10 MG tablet Take 10 mg by mouth daily.   liothyronine (CYTOMEL) 5 MCG  tablet Take 5 mcg by mouth 2 (two) times daily.   lisinopril (ZESTRIL) 20 MG tablet Take 1 tablet (20 mg total) by mouth daily.   methylphenidate (RITALIN) 5 MG tablet Take 1 tablet (5 mg total) by mouth 2 (two) times daily with breakfast and lunch. (Patient taking differently: Take 5 mg by mouth in the morning.)   mirtazapine (REMERON) 15 MG tablet Take 1 tablet (15 mg total) by mouth at bedtime.   ondansetron (ZOFRAN-ODT) 4 MG disintegrating tablet Take 1 tablet (4 mg total) by mouth every 8 (eight) hours as needed.   oxyCODONE-acetaminophen (PERCOCET) 5-325 MG tablet Take 1 tablet by mouth every 4 (four) hours as needed for severe pain (pain score 7-10).   pantoprazole (PROTONIX) 40 MG tablet Take 1 tablet (40 mg total) by mouth daily.   prazosin (MINIPRESS) 1 MG capsule TAKE 1 CAPSULE(1 MG) BY MOUTH TWICE DAILY   QUEtiapine (SEROQUEL) 25 MG tablet Take 1-2 tablets (25-50 mg total) by mouth as directed. Take 1 tablet at bedtime for 15 days and increase to 2 tablets (50 mg ) after that.   SUMAtriptan (IMITREX) 100 MG tablet Take 1 tablet (100 mg total) by mouth once as needed for migraine. May repeat in 2 hours if headache persists or recurs.   tadalafil (CIALIS) 20 MG tablet Take 20 mg by mouth daily as needed for erectile dysfunction.   testosterone cypionate (DEPOTESTOTERONE CYPIONATE) 100 MG/ML injection Inject 200 mg into the muscle every 7 (seven) days. For IM use only  Thursday's   varenicline (CHANTIX) 1 MG tablet Take 1 tablet (1 mg total) by mouth 2 (two) times daily.   No facility-administered encounter medications on file as of 09/22/2023.    Social History: Social History   Tobacco Use   Smoking status: Every Day    Current packs/day: 1.00    Types: Cigarettes   Smokeless tobacco: Former  Building services engineer status: Former  Substance Use Topics   Alcohol use: Not Currently   Drug use: Yes    Frequency: 2.0 times per week    Types: Other-see comments    Comment:  delta-8-Tetrahydrocannabinol    Family Medical History: Family History  Problem Relation Age of Onset   Anxiety disorder Mother    Healthy Father     Physical Examination: There were no vitals filed for this visit.  General: Patient is well developed, well nourished, calm, collected, and in no apparent distress. Attention to examination is appropriate.  Respiratory: Patient is breathing without any difficulty.   NEUROLOGICAL:     Awake, alert, oriented to person, place, and time.  Speech is clear and fluent. Fund of knowledge is appropriate.   Cranial Nerves: Pupils equal round and reactive to light.  Facial tone is symmetric.    *** ROM of cervical spine *** pain *** posterior cervical tenderness. *** tenderness in bilateral trapezial region.   *** ROM of lumbar spine ***  pain *** posterior lumbar tenderness.   No abnormal lesions on exposed skin.   Strength: Side Biceps Triceps Deltoid Interossei Grip Wrist Ext. Wrist Flex.  R 5 5 5 5 5 5 5   L 5 5 5 5 5 5 5    Side Iliopsoas Quads Hamstring PF DF EHL  R 5 5 5 5 5 5   L 5 5 5 5 5 5    Reflexes are ***2+ and symmetric at the biceps, brachioradialis, patella and achilles.   Hoffman's is absent.  Clonus is not present.   Bilateral upper and lower extremity sensation is intact to light touch.     Gait is normal.   ***No difficulty with tandem gait.    Medical Decision Making  Imaging: Lumbar xrays dated 08/26/23:  FINDINGS: Surgical clips project over the right upper quadrant and left lower quadrant. Postsurgical changes at L4-5 spinous process. There is no evidence of lumbar spine fracture. Alignment is normal. Intervertebral disc spaces are maintained.   IMPRESSION: No acute fracture or traumatic listhesis.     Electronically Signed   By: Agustin Cree M.D.   On: 08/26/2023 14:16    I have personally reviewed the images and agree with the above interpretation.  Assessment and Plan: Mr. Radin is a pleasant  43 y.o. male has ***  Treatment options discussed with patient and following plan made:   - Order for physical therapy for *** spine ***. Patient to call to schedule appointment. *** - Continue current medications including ***. Reviewed dosing and side effects.  - Prescription for ***. Reviewed dosing and side effects. Take with food.  - Prescription for *** to take prn muscle spasms. Reviewed dosing and side effects. Discussed this can cause drowsiness.  - MRI of *** to further evaluate *** radiculopathy. No improvement time or medications (***).  - Referral to PMR at [facility] to discuss possible *** injections.  - Will schedule phone visit to review MRI results once I get them back.   I spent a total of *** minutes in face-to-face and non-face-to-face activities related to this patient's care today including review of outside records, review of imaging, review of symptoms, physical exam, discussion of differential diagnosis, discussion of treatment options, and documentation.   Thank you for involving me in the care of this patient.   Drake Leach PA-C Dept. of Neurosurgery

## 2023-09-22 ENCOUNTER — Ambulatory Visit: Payer: 59 | Admitting: Orthopedic Surgery

## 2023-10-02 ENCOUNTER — Encounter: Payer: Self-pay | Admitting: Orthopedic Surgery

## 2023-12-23 ENCOUNTER — Telehealth: Payer: Self-pay

## 2023-12-23 NOTE — Transitions of Care (Post Inpatient/ED Visit) (Signed)
 12/23/2023  Name: Mark Austin MRN: [id number] DOB: [birth year]  Today's TOC FU Call Status: Today's TOC FU Call Status:: Successful TOC FU Call Completed TOC FU Call Complete Date: 12/23/23 Patient's Name and Date of Birth confirmed.  Transition Care Management Follow-up Telephone Call Date of Discharge: 12/20/23 Discharge Facility: Other [facility]) Name of Other ([facility]) Discharge Facility: [facility] Type of Discharge: Inpatient Admission Primary Inpatient Discharge Diagnosis:: pancreatitis How have you been since you were released from the hospital?: Better Any questions or concerns?: No  Items Reviewed: Did you receive and understand the discharge instructions provided?: Yes Medications obtained,verified, and reconciled?: Yes (Medications Reviewed) Any new allergies since your discharge?: No Dietary orders reviewed?: Yes Do you have support at home?: Yes People in Home [RPT]: spouse  Medications Reviewed Today: Medications Reviewed Today     Reviewed by Darrall Ellison, LPN (Licensed Practical Nurse) on 12/23/23 at 1006  Med List Status: <None>   Medication Order Taking? Sig Documenting Provider Last Dose Status Informant  amLODipine  (NORVASC ) 10 MG tablet [id number]  Take 1 tablet (10 mg total) by mouth daily. Janeane Mealy, MD  Expired 06/05/23 2359   anastrozole  (ARIMIDEX ) 1 MG tablet [id number]  Take 1 mg by mouth once a week. [provider]  Active Self  ARIPiprazole  (ABILIFY ) 10 MG tablet [id number]  Take 0.5 tablets (5 mg total) by mouth daily. Stop after 5 days Eappen, Saramma, MD  Active   benztropine  (COGENTIN ) 1 MG tablet [id number]  Take 1 tablet (1 mg total) by mouth 2 (two) times daily. Ma Saupe, MD  Active   carvedilol  (COREG ) 25 MG tablet [id number]  Take 25 mg by mouth 2 (two) times daily. [provider]  Active Self  cyclobenzaprine  (FLEXERIL ) 5 MG tablet [id number]  Take 1-2 tablets (5-10 mg total) by mouth 3 (three) times  daily as needed. Menshew, Raye Cai, PA-C  Active   eszopiclone (LUNESTA) 1 MG TABS tablet [id number]  Take 1 mg by mouth at bedtime as needed. [provider]  Active   ezetimibe  (ZETIA ) 10 MG tablet [id number]  Take 10 mg by mouth daily. [provider]  Active Self  FLOMAX  0.4 MG CAPS capsule [id number] Yes Take 1 capsule by mouth daily. [provider]  Active   fluvoxaMINE (LUVOX) 50 MG tablet [id number] Yes Take 50 mg by mouth at bedtime. [provider]  Active   liothyronine  (CYTOMEL ) 5 MCG tablet [id number]  Take 5 mcg by mouth 2 (two) times daily. [provider]  Active Self  lisinopril  (ZESTRIL ) 20 MG tablet [id number]  Take 1 tablet (20 mg total) by mouth daily. Janeane Mealy, MD  Expired 06/05/23 2359   magnesium  oxide (MAG-OX) 400 MG tablet [id number]  Take by mouth daily. [provider]  Active   methylphenidate  (RITALIN ) 5 MG tablet [id number]  Take 1 tablet (5 mg total) by mouth 2 (two) times daily with breakfast and lunch.  Patient taking differently: Take 5 mg by mouth in the morning.   Ma Saupe, MD  Active   mirtazapine  (REMERON ) 15 MG tablet [id number]  Take 1 tablet (15 mg total) by mouth at bedtime. Matthews, Jason J, MD  Active   ondansetron  (ZOFRAN -ODT) 4 MG disintegrating tablet [id number]  Take 1 tablet (4 mg total) by mouth every 8 (eight) hours as needed. Heather Litter, MD  Active   oxyCODONE -acetaminophen  (PERCOCET) 5-325 MG tablet [id number]  Take 1 tablet by mouth every 4 (four) hours as needed  for severe pain (pain score 7-10). Menshew, Raye Cai, PA-C  Active   pantoprazole  (PROTONIX ) 40 MG tablet [id number]  Take 1 tablet (40 mg total) by mouth daily. Heather Litter, MD  Expired 08/13/22 2359   prazosin  (MINIPRESS ) 1 MG capsule [id number]  TAKE 1 CAPSULE(1 MG) BY MOUTH TWICE DAILY Matthews, Jason J, MD  Active   QUEtiapine  (SEROQUEL ) 25 MG tablet [id number]  Take 1-2 tablets (25-50 mg total)  by mouth as directed. Take 1 tablet at bedtime for 15 days and increase to 2 tablets (50 mg ) after that. Eappen, Saramma, MD  Active   ramelteon (ROZEREM) 8 MG tablet [id number]  Take by mouth at bedtime. [provider]  Active   rOPINIRole (REQUIP) 0.25 MG tablet [id number] Yes Take by mouth at bedtime. [provider]  Active   SUMAtriptan  (IMITREX ) 100 MG tablet [id number]  Take 1 tablet (100 mg total) by mouth once as needed for migraine. May repeat in 2 hours if headache persists or recurs. Matthews, Jason J, MD  Active   tadalafil (CIALIS) 20 MG tablet [id number]  Take 20 mg by mouth daily as needed for erectile dysfunction. [provider]  Active Self  testosterone  cypionate (DEPOTESTOTERONE CYPIONATE) 100 MG/ML injection [id number]  Inject 200 mg into the muscle every 7 (seven) days. For IM use only  Thursday's [provider]  Active Self  varenicline  (CHANTIX ) 1 MG tablet [id number]  Take 1 tablet (1 mg total) by mouth 2 (two) times daily. Ma Saupe, MD  Active   VITAMIN D -1000 MAX ST 25 MCG (1000 UT) tablet [id number] Yes Take 1,000 Units by mouth daily. [provider]  Active             Home Care and Equipment/Supplies: Were Home Health Services Ordered?: NA Any new equipment or medical supplies ordered?: NA  Functional Questionnaire: Do you need assistance with bathing/showering or dressing?: No Do you need assistance with meal preparation?: No Do you need assistance with eating?: No Do you have difficulty maintaining continence: No Do you need assistance with getting out of bed/getting out of a chair/moving?: No Do you have difficulty managing or taking your medications?: No  Follow up appointments reviewed: PCP Follow-up appointment confirmed?: Yes Date of PCP follow-up appointment?: 12/24/23 Follow-up Provider: [facility] Follow-up appointment confirmed?: NA Do you need transportation to your  follow-up appointment?: No Do you understand care options if your condition(s) worsen?: Yes-patient verbalized understanding    SIGNATURE Darrall Ellison, LPN [facility] Nurse Health Advisor Direct Dial [phone]

## 2023-12-24 ENCOUNTER — Ambulatory Visit
Admission: RE | Admit: 2023-12-24 | Discharge: 2023-12-24 | Disposition: A | Discharge status: Home / Self Care | Source: Ambulatory Visit | Attending: Family Medicine

## 2023-12-24 ENCOUNTER — Ambulatory Visit (INDEPENDENT_AMBULATORY_CARE_PROVIDER_SITE_OTHER): Admitting: Family Medicine

## 2023-12-24 ENCOUNTER — Encounter: Payer: Self-pay | Admitting: Family Medicine

## 2023-12-24 ENCOUNTER — Ambulatory Visit
Admission: RE | Admit: 2023-12-24 | Discharge: 2023-12-24 | Disposition: A | Discharge status: Home / Self Care | Attending: Family Medicine | Admitting: Family Medicine

## 2023-12-24 VITALS — BP 138/100 | HR 92 | Ht 70.0 in | Wt 249.0 lb

## 2023-12-24 DIAGNOSIS — Z0000 Encounter for general adult medical examination without abnormal findings: Secondary | ICD-10-CM

## 2023-12-24 DIAGNOSIS — I10 Essential (primary) hypertension: Secondary | ICD-10-CM

## 2023-12-24 DIAGNOSIS — G8929 Other chronic pain: Secondary | ICD-10-CM

## 2023-12-24 DIAGNOSIS — R7309 Other abnormal glucose: Secondary | ICD-10-CM

## 2023-12-24 DIAGNOSIS — E785 Hyperlipidemia, unspecified: Secondary | ICD-10-CM

## 2023-12-24 DIAGNOSIS — G4733 Obstructive sleep apnea (adult) (pediatric): Secondary | ICD-10-CM

## 2023-12-24 DIAGNOSIS — I7090 Unspecified atherosclerosis: Secondary | ICD-10-CM | POA: Diagnosis not present

## 2023-12-24 DIAGNOSIS — M25561 Pain in right knee: Secondary | ICD-10-CM

## 2023-12-24 MED ORDER — LISINOPRIL 40 MG PO TABS: 20.0000 mg | ORAL_TABLET | Freq: Every day | ORAL | 0 refills | Status: AC

## 2023-12-24 MED ORDER — ROSUVASTATIN CALCIUM 10 MG PO TABS
10.0000 mg | ORAL_TABLET | Freq: Every day | ORAL | 3 refills | Status: AC
Start: 2023-12-24 — End: ?

## 2023-12-24 MED ORDER — METOPROLOL SUCCINATE ER 200 MG PO TB24: 200.0000 mg | ORAL_TABLET | Freq: Every day | ORAL | 3 refills | Status: AC

## 2023-12-24 MED ORDER — ROPINIROLE HCL 0.25 MG PO TABS: 0.2500 mg | ORAL_TABLET | Freq: Every day | ORAL | 3 refills | Status: AC

## 2023-12-24 NOTE — Progress Notes (Unsigned)
 Primary Care / Sports Medicine Office Visit  Patient Information:  Patient ID: Mark Austin, male DOB: [birth year] Age: 43 y.o. MRN: [id number]   Mark Austin is a pleasant 43 y.o. male presenting with the following:  Chief Complaint  Patient presents with   Hospitalization Follow-up    Patient presents today for a follow up from his recent ED visit on 12/15/23. He has been feeling extremely over heated for the last week or 2. Having issues regulating body temp. At the ED they were concerned about the calcification of cholesterol. He is having depression which is causing him to gain weight.     Vitals:   12/24/23 1104  BP: (!) 138/100  Pulse: 92  SpO2: 98%   Vitals:   12/24/23 1104  Weight: 249 lb (112.9 kg)  Height: 5\' 10"  (1.778 m)   Body mass index is 35.73 kg/m.  No results found.   Independent interpretation of notes and tests performed by another provider:   None  Procedures performed:   None  Pertinent History, Exam, Impression, and Recommendations:   Problem List Items Addressed This Visit     Atherosclerosis - Primary   Atherosclerosis with Hyperlipidemia CT scan showed calcified deposits. 10-year ASCVD risk at 17%. Discussed cholesterol management and statin side effects. - Initiate statin therapy. - Recheck cholesterol levels in 4-6 weeks. - Advise taking statin in the evening. - Recommend CoQ10 for muscle cramps.      Relevant Medications   metoprolol  (TOPROL -XL) 200 MG 24 hr tablet   lisinopril  (ZESTRIL ) 40 MG tablet   rosuvastatin  (CRESTOR ) 10 MG tablet   Essential (primary) hypertension   Hypertension Blood pressure elevated. Current regimen suboptimal. Discussed stress and sleep apnea contributions. - Increase lisinopril  to 40 mg daily in the morning. - Switch metoprolol  to 200 mg extended-release once daily.      Relevant Medications   metoprolol  (TOPROL -XL) 200 MG 24 hr tablet   lisinopril  (ZESTRIL ) 40 MG tablet   rosuvastatin   (CRESTOR ) 10 MG tablet   Other Relevant Orders   Apo A1 + B + Ratio   Comprehensive metabolic panel with GFR   Lipid panel   TSH   HLD (hyperlipidemia)   Relevant Medications   metoprolol  (TOPROL -XL) 200 MG 24 hr tablet   lisinopril  (ZESTRIL ) 40 MG tablet   rosuvastatin  (CRESTOR ) 10 MG tablet   Other Relevant Orders   Apo A1 + B + Ratio   Lipid panel   Obstructive sleep apnea of adult   Sleep apnea Diagnosed but not using CPAP. Discussed importance of treatment and alternative options. - Refer to sleep medicine for evaluation. - Discuss alternative CPAP options.      OSA (obstructive sleep apnea)   Relevant Orders   Ambulatory referral to Pulmonology   Other Visit Diagnoses       Healthcare maintenance       Relevant Orders   CBC   Apo A1 + B + Ratio   Comprehensive metabolic panel with GFR   Hemoglobin A1c   Lipid panel   PSA Total (Reflex To Free)   TSH   VITAMIN D  25 Hydroxy (Vit-D Deficiency, Fractures)     Abnormal glucose       Relevant Orders   Hemoglobin A1c     Chronic pain of right knee       Relevant Orders   DG Knee Complete 4 Views Right        Orders & Medications Medications:  Meds ordered this  encounter  Medications   rOPINIRole (REQUIP) 0.25 MG tablet    Sig: Take 1 tablet (0.25 mg total) by mouth at bedtime.    Dispense:  90 tablet    Refill:  3   metoprolol (TOPROL-XL) 200 MG 24 hr tablet    Sig: Take 1 tablet (200 mg total) by mouth daily.    Dispense:  90 tablet    Refill:  3   lisinopril  (ZESTRIL ) 40 MG tablet    Sig: Take 0.5 tablets (20 mg total) by mouth daily.    Dispense:  90 tablet    Refill:  0   rosuvastatin (CRESTOR) 10 MG tablet    Sig: Take 1 tablet (10 mg total) by mouth daily.    Dispense:  90 tablet    Refill:  3   Orders Placed This Encounter  Procedures   DG Knee Complete 4 Views Right   CBC   Apo A1 + B + Ratio   Comprehensive metabolic panel with GFR   Hemoglobin A1c   Lipid panel   PSA Total (Reflex  To Free)   TSH   VITAMIN D  25 Hydroxy (Vit-D Deficiency, Fractures)   Ambulatory referral to Pulmonology     No follow-ups on file.     Ma Saupe, MD, [facility]   Primary Care Sports Medicine Primary Care and Sports Medicine at [facility]

## 2023-12-26 ENCOUNTER — Encounter: Payer: Self-pay | Admitting: Sleep Medicine

## 2023-12-26 ENCOUNTER — Ambulatory Visit: Admitting: Sleep Medicine

## 2023-12-26 VITALS — BP 138/90 | HR 70 | Temp 96.9°F | Ht 70.0 in | Wt 246.2 lb

## 2023-12-26 DIAGNOSIS — F411 Generalized anxiety disorder: Secondary | ICD-10-CM

## 2023-12-26 DIAGNOSIS — G4733 Obstructive sleep apnea (adult) (pediatric): Secondary | ICD-10-CM

## 2023-12-26 DIAGNOSIS — E669 Obesity, unspecified: Secondary | ICD-10-CM | POA: Diagnosis not present

## 2023-12-26 DIAGNOSIS — G4700 Insomnia, unspecified: Secondary | ICD-10-CM

## 2023-12-26 DIAGNOSIS — F419 Anxiety disorder, unspecified: Secondary | ICD-10-CM

## 2023-12-26 DIAGNOSIS — I10 Essential (primary) hypertension: Secondary | ICD-10-CM | POA: Diagnosis not present

## 2023-12-26 DIAGNOSIS — Z6835 Body mass index (BMI) 35.0-35.9, adult: Secondary | ICD-10-CM

## 2023-12-26 DIAGNOSIS — F17210 Nicotine dependence, cigarettes, uncomplicated: Secondary | ICD-10-CM

## 2023-12-26 DIAGNOSIS — I7090 Unspecified atherosclerosis: Secondary | ICD-10-CM | POA: Insufficient documentation

## 2023-12-26 DIAGNOSIS — F5104 Psychophysiologic insomnia: Secondary | ICD-10-CM

## 2023-12-26 NOTE — Progress Notes (Signed)
 Name:Mark Austin MRN: [id number] DOB: 07-22-1981   CHIEF COMPLAINT:  REASSESSMENT OF OSA   HISTORY OF PRESENT ILLNESS:  Mark Austin is a 43 y.o. w/ a h/o OSA, HTN, obesity and depression who presents for reassessment of OSA. Reports that he was initially diagnosed with OSA several years ago and subsequently started on CPAP therapy. Reports using CPAP therapy for a brief period and discontinuing use due to mask discomfort. Reports a 60 lb weight gain over the last year. Reports snoring and excessive daytime sleepiness. Reports nocturnal awakenings due to unclear reasons and has difficulty falling back to sleep. Admits to night sweats. Denies morning headaches, RLS symptoms, dream enactment, cataplexy, hypnagogic or hypnapompic hallucinations. Denies a family history of sleep apnea. Denies drowsy driving. Drinks 1 cup of coffee daily, denies alcohol or illicit drug use. Smokes 1 ppd tobacco.   Bedtime 9-11 pm Sleep onset 3-4 hours Rise time 7 am   EPWORTH SLEEP SCORE 5    12/26/2023   10:00 AM  Results of the Epworth flowsheet  Sitting and reading 0  Watching TV 1  Sitting, inactive in a public place (e.g. a theatre or a meeting) 0  As a passenger in a car for an hour without a break 1  Lying down to rest in the afternoon when circumstances permit 2  Sitting and talking to someone 0  Sitting quietly after a lunch without alcohol 1  In a car, while stopped for a few minutes in traffic 0  Total score 5    PAST MEDICAL HISTORY :   has a past medical history of Anxiety, Back ache, Depression, Diverticulitis, GERD (gastroesophageal reflux disease), History of kidney stones, Hypertension, Inguinal hernia, PTSD (post-traumatic stress disorder), and Seizure (HCC).  has a past surgical history that includes back surgeries; Back surgery; Bowel resection (2016); Inguinal hernia repair (Left); Shoulder surgery (Right); Cholecystectomy (2017); XI Robotic assisted inguinal hernia repair  with mesh (Right, 10/08/2019); Hernia repair; Colon surgery; Colonoscopy; Excision mass lower extremeties (Left, 11/30/2019); Colonoscopy with propofol  (N/A, 03/19/2021); and polypectomy (N/A, 03/19/2021). Prior to Admission medications   Medication Sig Start Date End Date Taking? Authorizing Provider  anastrozole  (ARIMIDEX ) 1 MG tablet Take 1 mg by mouth once a week. 05/08/21  Yes [provider]  ARIPiprazole  (ABILIFY ) 10 MG tablet Take 0.5 tablets (5 mg total) by mouth daily. Stop after 5 days 08/15/22  Yes Eappen, Saramma, MD  benztropine  (COGENTIN ) 1 MG tablet Take 1 tablet (1 mg total) by mouth 2 (two) times daily. 07/15/22  Yes Ma Saupe, MD  cyclobenzaprine  (FLEXERIL ) 5 MG tablet Take 1-2 tablets (5-10 mg total) by mouth 3 (three) times daily as needed. 09/04/23  Yes Menshew, Raye Cai, PA-C  eszopiclone (LUNESTA) 1 MG TABS tablet Take 1 mg by mouth at bedtime as needed.   Yes [provider]  FLOMAX  0.4 MG CAPS capsule Take 1 capsule by mouth daily. 01/08/23  Yes [provider]  fluvoxaMINE (LUVOX) 50 MG tablet Take 100 mg by mouth at bedtime. 100mg  11/10/23  Yes [provider]  liothyronine  (CYTOMEL ) 5 MCG tablet Take 5 mcg by mouth 2 (two) times daily. 04/28/22  Yes [provider]  lisinopril  (ZESTRIL ) 40 MG tablet Take 0.5 tablets (20 mg total) by mouth daily. 12/24/23 12/23/24 Yes Ma Saupe, MD  magnesium  oxide (MAG-OX) 400 MG tablet Take by mouth daily.   Yes [provider]  metoprolol (TOPROL-XL) 200 MG 24 hr tablet Take  1 tablet (200 mg total) by mouth daily. 12/24/23  Yes Ma Saupe, MD  ondansetron  (ZOFRAN -ODT) 4 MG disintegrating tablet Take 1 tablet (4 mg total) by mouth every 8 (eight) hours as needed. 07/14/22  Yes Heather Litter, MD  prazosin  (MINIPRESS ) 1 MG capsule TAKE 1 CAPSULE(1 MG) BY MOUTH TWICE DAILY 07/15/22  Yes Ma Saupe, MD  rOPINIRole  (REQUIP ) 0.25 MG tablet Take 1 tablet (0.25 mg  total) by mouth at bedtime. 12/24/23  Yes Ma Saupe, MD  rosuvastatin  (CRESTOR ) 10 MG tablet Take 1 tablet (10 mg total) by mouth daily. 12/24/23  Yes Ma Saupe, MD  tadalafil (CIALIS) 20 MG tablet Take 20 mg by mouth daily as needed for erectile dysfunction.   Yes [provider]  VITAMIN D -1000 MAX ST 25 MCG (1000 UT) tablet Take 1,000 Units by mouth daily. 02/05/16  Yes [provider]  amLODipine  (NORVASC ) 10 MG tablet Take 1 tablet (10 mg total) by mouth daily. 06/05/22 06/05/23  Wouk, Haynes Lips, MD  ezetimibe  (ZETIA ) 10 MG tablet Take 10 mg by mouth daily. Patient not taking: Reported on 12/24/2023 05/25/22   [provider]  methylphenidate  (RITALIN ) 5 MG tablet Take 1 tablet (5 mg total) by mouth 2 (two) times daily with breakfast and lunch. Patient not taking: Reported on 12/26/2023 07/15/22   Ma Saupe, MD  mirtazapine  (REMERON ) 15 MG tablet Take 1 tablet (15 mg total) by mouth at bedtime. Patient not taking: Reported on 12/26/2023 08/16/22   Ma Saupe, MD  QUEtiapine  (SEROQUEL ) 25 MG tablet Take 1-2 tablets (25-50 mg total) by mouth as directed. Take 1 tablet at bedtime for 15 days and increase to 2 tablets (50 mg ) after that. Patient not taking: Reported on 12/26/2023 08/15/22   Eappen, Saramma, MD  ramelteon (ROZEREM) 8 MG tablet Take by mouth at bedtime. Patient not taking: Reported on 12/26/2023    [provider]  SUMAtriptan  (IMITREX ) 100 MG tablet Take 1 tablet (100 mg total) by mouth once as needed for migraine. May repeat in 2 hours if headache persists or recurs. Patient not taking: Reported on 12/26/2023 07/15/22   Ma Saupe, MD  testosterone  cypionate (DEPOTESTOTERONE CYPIONATE) 100 MG/ML injection Inject 200 mg into the muscle every 7 (seven) days. For IM use only  Thursday's Patient not taking: Reported on 12/24/2023    [provider]  varenicline  (CHANTIX ) 1 MG tablet Take 1 tablet (1 mg total)  by mouth 2 (two) times daily. Patient not taking: Reported on 12/26/2023 07/17/22   Ma Saupe, MD   Allergies  Allergen Reactions   Ketorolac Tromethamine Anaphylaxis    Edema of the tongue, Edema, Swelling, Seizure   Lidocaine  Other (See Comments) and Anaphylaxis    Intolerant to lidocaine  patches: Seizure   Tramadol Other (See Comments)    Seizures    Atorvastatin Other (See Comments)    Muscle aches   Brassica Oleracea Hives    "Cauliflower"   Carisoprodol Other (See Comments)    Sedation   Gabapentin  Other (See Comments) and Palpitations    "Feels like skin is wanting to be removed from body"   Rosuvastatin  Other (See Comments)    Muscle aches    FAMILY HISTORY:  family history includes Anxiety disorder in his mother; Healthy in his father. SOCIAL HISTORY:  reports that he has been smoking cigarettes. He has quit using smokeless tobacco. He reports that he does not currently use alcohol. He reports current  drug use. Frequency: 2.00 times per week. Drug: Other-see comments.   Review of Systems:  Gen:  Denies  fever, sweats, chills weight loss  HEENT: Denies blurred vision, double vision, ear pain, eye pain, hearing loss, nose bleeds, sore throat Cardiac:  No dizziness, chest pain or heaviness, chest tightness,edema, No JVD Resp:   No cough, -sputum production, -shortness of breath,-wheezing, -hemoptysis,  Gi: Denies swallowing difficulty, stomach pain, nausea or vomiting, diarrhea, constipation, bowel incontinence Gu:  Denies bladder incontinence, burning urine Ext:   Denies Joint pain, stiffness or swelling Skin: Denies  skin rash, easy bruising or bleeding or hives Endoc:  Denies polyuria, polydipsia , polyphagia or weight change Psych:   Denies depression, insomnia or hallucinations  Other:  All other systems negative  VITAL SIGNS: BP (!) 138/90 (BP Location: Right Arm, Cuff Size: Large)   Pulse 70   Temp (!) 96.9 F (36.1 C)   Ht 5\' 10"  (1.778 m)   Wt  246 lb 3.2 oz (111.7 kg)   SpO2 96%   BMI 35.33 kg/m    Physical Examination:   General Appearance: No distress  EYES PERRLA, EOM intact.   NECK Supple, No JVD Pulmonary: normal breath sounds, No wheezing.  CardiovascularNormal S1,S2.  No m/r/g.   Abdomen: Benign, Soft, non-tender. Skin:   warm, no rashes, no ecchymosis  Extremities: normal, no cyanosis, clubbing. Neuro:without focal findings,  speech normal  PSYCHIATRIC: Mood, affect within normal limits.   ASSESSMENT AND PLAN  OSA I suspect that OSA is likely present due to clinical presentation. Discussed the consequences of untreated sleep apnea. Advised not to drive drowsy for safety of patient and others. Will complete further evaluation with a home sleep study and follow up to review results.    HTN BP elevated, advised patient to follow up with PCP for further management.   Obesity Counseled patient on diet and lifestyle modification.  Insomnia Counseled patient on stimulus control and improving sleep hygiene practices. I suspect that tobacco use is one of the underlying causes of insomnia. Counseled patient on smoking cessation.   Anxiety Also suspect that mood disorder is an underlying cause of insomnia. Advised patient to follow up with psych.   MEDICATION ADJUSTMENTS/LABS AND TESTS ORDERED: Recommend Sleep Study   Patient  satisfied with Plan of action and management. All questions answered  Follow up to review HST results and treatment plan.   I spent a total of 61 minutes reviewing chart data, face-to-face evaluation with the patient, counseling and coordination of care as detailed above.    Mckena Chern, M.D.  Sleep Medicine [facility]

## 2023-12-26 NOTE — Assessment & Plan Note (Signed)
 Atherosclerosis with Hyperlipidemia CT scan showed calcified deposits. 10-year ASCVD risk at 17%. Discussed cholesterol management and statin side effects. - Initiate statin therapy. - Recheck cholesterol levels in 4-6 weeks. - Advise taking statin in the evening. - Recommend CoQ10 for muscle cramps.

## 2023-12-26 NOTE — Assessment & Plan Note (Signed)
 Hypertension Blood pressure elevated. Current regimen suboptimal. Discussed stress and sleep apnea contributions. - Increase lisinopril  to 40 mg daily in the morning. - Switch metoprolol to 200 mg extended-release once daily.

## 2023-12-26 NOTE — Assessment & Plan Note (Signed)
 Sleep apnea Diagnosed but not using CPAP. Discussed importance of treatment and alternative options. - Refer to sleep medicine for evaluation. - Discuss alternative CPAP options.

## 2023-12-26 NOTE — Patient Instructions (Signed)
 Mark Austin

## 2024-01-06 ENCOUNTER — Encounter

## 2024-01-06 DIAGNOSIS — G4733 Obstructive sleep apnea (adult) (pediatric): Secondary | ICD-10-CM

## 2024-01-07 ENCOUNTER — Ambulatory Visit: Payer: Self-pay | Admitting: Family Medicine

## 2024-01-27 DIAGNOSIS — G4733 Obstructive sleep apnea (adult) (pediatric): Secondary | ICD-10-CM | POA: Diagnosis not present

## 2024-01-28 ENCOUNTER — Ambulatory Visit: Payer: Self-pay

## 2024-02-06 NOTE — Telephone Encounter (Signed)
 Lm x2 for the patient and I have mailed him a letter.  Closing this encounter per office protocol.

## 2024-02-06 NOTE — Telephone Encounter (Signed)
-----   Message from [facility] D REDDY sent at 01/28/2024  3:40 PM EDT ----- Please notify patient that HST revealed moderate OSA, recommend proceeding with APAP therapy set to 4-16 cm H2O, EPR 3 with the Airtouch N30i nasal mask. Please also schedule a 3 month follow up  visit if patient wishes to proceed with CPAP therapy. Thanks    ----- Message ----- From: Vannie Donzell RAMAN Sent: 01/27/2024   5:20 PM EDT To: Pallavi D Reddy, MD
